# Patient Record
Sex: Female | Born: 1970
Health system: Southern US, Community
[De-identification: ages and names within clinical notes are randomized; demographics above are authoritative.]

## PROBLEM LIST (undated history)

## (undated) DIAGNOSIS — Z9289 Personal history of other medical treatment: Secondary | ICD-10-CM

## (undated) DIAGNOSIS — Z79899 Other long term (current) drug therapy: Secondary | ICD-10-CM

## (undated) DIAGNOSIS — M069 Rheumatoid arthritis, unspecified: Secondary | ICD-10-CM

## (undated) DIAGNOSIS — Z86718 Personal history of other venous thrombosis and embolism: Secondary | ICD-10-CM

## (undated) DIAGNOSIS — I2699 Other pulmonary embolism without acute cor pulmonale: Secondary | ICD-10-CM

## (undated) DIAGNOSIS — Z7901 Long term (current) use of anticoagulants: Secondary | ICD-10-CM

## (undated) DIAGNOSIS — R945 Abnormal results of liver function studies: Secondary | ICD-10-CM

## (undated) DIAGNOSIS — D689 Coagulation defect, unspecified: Secondary | ICD-10-CM

## (undated) DIAGNOSIS — R7989 Other specified abnormal findings of blood chemistry: Secondary | ICD-10-CM

## (undated) HISTORY — DX: Personal history of other venous thrombosis and embolism: Z86.718

## (undated) HISTORY — DX: Coagulation defect, unspecified: D68.9

## (undated) HISTORY — DX: Other specified abnormal findings of blood chemistry: R79.89

## (undated) HISTORY — DX: Rheumatoid arthritis, unspecified: M06.9

## (undated) HISTORY — DX: Other pulmonary embolism without acute cor pulmonale: I26.99

## (undated) HISTORY — DX: Other long term (current) drug therapy: Z79.899

## (undated) HISTORY — DX: Abnormal results of liver function studies: R94.5

## (undated) HISTORY — DX: Personal history of other medical treatment: Z92.89

## (undated) HISTORY — DX: Long term (current) use of anticoagulants: Z79.01

---

## 2004-08-02 HISTORY — PX: OTHER SURGICAL HISTORY: SHX169

## 2010-07-31 DIAGNOSIS — E282 Polycystic ovarian syndrome: Secondary | ICD-10-CM | POA: Insufficient documentation

## 2010-10-23 ENCOUNTER — Encounter: Payer: Self-pay | Admitting: Internal Medicine

## 2010-10-23 ENCOUNTER — Ambulatory Visit (INDEPENDENT_AMBULATORY_CARE_PROVIDER_SITE_OTHER): Payer: BC Managed Care – PPO | Admitting: Internal Medicine

## 2010-10-23 VITALS — BP 110/70 | HR 78 | Temp 98.4°F | Wt 106.2 lb

## 2010-10-23 DIAGNOSIS — N390 Urinary tract infection, site not specified: Secondary | ICD-10-CM

## 2010-10-23 DIAGNOSIS — K5190 Ulcerative colitis, unspecified, without complications: Secondary | ICD-10-CM

## 2010-10-23 DIAGNOSIS — R109 Unspecified abdominal pain: Secondary | ICD-10-CM

## 2010-10-23 DIAGNOSIS — Z86718 Personal history of other venous thrombosis and embolism: Secondary | ICD-10-CM

## 2010-10-23 DIAGNOSIS — M069 Rheumatoid arthritis, unspecified: Secondary | ICD-10-CM

## 2010-10-23 LAB — CBC WITH DIFFERENTIAL/PLATELET
Basophils Absolute: 0 10*3/uL (ref 0.0–0.1)
Eosinophils Relative: 3.5 % (ref 0.0–5.0)
Hemoglobin: 13.3 g/dL (ref 12.0–15.0)
Lymphocytes Relative: 25.4 % (ref 12.0–46.0)
Monocytes Relative: 8.3 % (ref 3.0–12.0)
Neutro Abs: 5.1 10*3/uL (ref 1.4–7.7)
RBC: 4.53 Mil/uL (ref 3.87–5.11)
RDW: 12.6 % (ref 11.5–14.6)
WBC: 8.2 10*3/uL (ref 4.5–10.5)

## 2010-10-23 LAB — POCT URINALYSIS DIPSTICK
Bilirubin, UA: NEGATIVE
Blood, UA: NEGATIVE
Glucose, UA: NEGATIVE
Spec Grav, UA: 1.01

## 2010-10-23 LAB — BASIC METABOLIC PANEL
Calcium: 9 mg/dL (ref 8.4–10.5)
GFR: 104.64 mL/min (ref >60.00)
Glucose, Bld: 82 mg/dL (ref 70–99)
Potassium: 4.5 mEq/L (ref 3.5–5.1)
Sodium: 140 mEq/L (ref 135–145)

## 2010-10-23 MED ORDER — CIPROFLOXACIN HCL 500 MG PO TABS: 500.0000 mg | ORAL_TABLET | Freq: Two times a day (BID) | ORAL | Status: AC

## 2010-10-23 NOTE — Progress Notes (Signed)
  Subjective:    Patient ID: Annette Hopkins, female    DOB: 01-05-1971, 40 y.o.   MRN: [id number]  HPI   new patient   developed right lower back pain today, the  Pain is steady, some  radiation to the right lower quadrant of the abdomen, described  as sharp, it  does change and get worse if she moves her torso.   similar symptoms 2 weeks ago, mildler, they self resolve. Patient wonders if she has a UTI.   Review of Systems   no fever  no nausea or vomiting She has a history of ulcerative colitis, she has chronic diarrhea, slightly worse for the last few weeks, she thinks she has a mild flareup. Denies abdominal pain No dysuria or gross hematuria No rash at the abdomen or back  Past Medical History  Diagnosis Date  . Ulcerative colitis     dx  around 2004  . History of blood clots     DVT, abdomen, on heparin   . RA (rheumatoid arthritis)     ~2005   PSH No major surgeries   SH Married 1 daughter Tobacco-no ETOH-- rarely Moved from [place] ~ July 2011 Works @ [employer]     Objective:   Physical Exam  Constitutional: She appears well-developed and well-nourished.  Eyes: No scleral icterus.  Cardiovascular: Regular rhythm and normal heart sounds.   Pulmonary/Chest: Effort normal and breath sounds normal.  Abdominal:        Nondistended, slightly increased bowel sounds, mildly tender at the lower abdomen ( suprapubic and  both sides). She is slightly tender at the  right flank. No mass or rebound.  she has  A + CVA tenderness   Musculoskeletal:        nontender to palpation on the back. She does have an antalgic position when she laid down on  the table to be examined          Assessment & Plan:

## 2010-10-23 NOTE — Assessment & Plan Note (Addendum)
Patient with multiple comorbidities presents with flank pain.  the urinalysis shows some leukocytes. She is afebrile.  most likely diagnosis is UTI however all other conditions are possible (b/c her co morbidities). Plan: Antibiotics Labs See instructions

## 2010-10-23 NOTE — Patient Instructions (Signed)
Rest  Take lots of fluids  Cipro x 10 days   Tylenol for pain Call or go to the ER if symptoms are  Severe, you get worse, you have fever, nausea, vomiting, abdominal pain.

## 2010-10-24 ENCOUNTER — Other Ambulatory Visit: Payer: Self-pay | Admitting: Internal Medicine

## 2010-10-24 NOTE — Assessment & Plan Note (Signed)
Needs to get stablish w/ a local GI

## 2010-10-24 NOTE — Assessment & Plan Note (Signed)
Declined a referal to a local rheumatologist for now

## 2010-10-26 LAB — URINE CULTURE: Colony Count: >=100000

## 2010-10-27 ENCOUNTER — Telehealth: Payer: Self-pay | Admitting: Internal Medicine

## 2010-10-27 NOTE — Telephone Encounter (Signed)
Pt is returning call.  

## 2010-11-03 ENCOUNTER — Ambulatory Visit (INDEPENDENT_AMBULATORY_CARE_PROVIDER_SITE_OTHER): Payer: BC Managed Care – PPO | Admitting: Internal Medicine

## 2010-11-03 ENCOUNTER — Encounter: Payer: Self-pay | Admitting: Internal Medicine

## 2010-11-03 DIAGNOSIS — Z86718 Personal history of other venous thrombosis and embolism: Secondary | ICD-10-CM

## 2010-11-03 DIAGNOSIS — R109 Unspecified abdominal pain: Secondary | ICD-10-CM

## 2010-11-03 DIAGNOSIS — K5190 Ulcerative colitis, unspecified, without complications: Secondary | ICD-10-CM

## 2010-11-03 DIAGNOSIS — Z0000 Encounter for general adult medical examination without abnormal findings: Secondary | ICD-10-CM | POA: Insufficient documentation

## 2010-11-03 NOTE — Patient Instructions (Signed)
Please get records from your GI and hematologist doctors in [place]

## 2010-11-03 NOTE — Assessment & Plan Note (Signed)
see history of present illness, having more diarrhea than usual. She self discontinue Imuran ~ 1 year ago. Definitely needs a local GI, will refer. Will also going to get records from previous GI

## 2010-11-03 NOTE — Assessment & Plan Note (Signed)
Symptoms resolved, suspect she had a UTI. Will call if symptoms resurface

## 2010-11-03 NOTE — Progress Notes (Signed)
  Subjective:    Patient ID: Annette Hopkins, female    DOB: 08/07/1970, 40 y.o.   MRN: [id number]  HPI Followup from last office visit, she had right-sided flank pain, urine culture was inconclusive, she finished ciprofloxacin as prescribed. Symptoms  resolved.   Past Medical History  Diagnosis Date  . Ulcerative colitis     dx  around 2004  . History of blood clots     DVT, abdomen, on heparin   . RA (rheumatoid arthritis)     ~2005     Review of Systems No fever, nausea or vomiting In the last few weeks, she thinks she is having a colitis flareup. She is having more diarrhea than usual and feeling weak. No blood in the stools Appetite is slightly low. Has noted some weight loss. Admits to some emotional distress because she moved to [place] a few months ago but denies any problems at home.    Objective:   Physical Exam  Constitutional: She appears well-developed.  Eyes:       Not pale or jaundice  Cardiovascular: Normal rate, regular rhythm and normal heart sounds.   Pulmonary/Chest: Breath sounds normal. No respiratory distress.  Abdominal: Soft. Bowel sounds are normal. She exhibits no distension. There is no tenderness. There is no rebound and no guarding.          Assessment & Plan:

## 2010-11-03 NOTE — Assessment & Plan Note (Signed)
Needs a local gynecologist,   referral

## 2010-11-03 NOTE — Assessment & Plan Note (Signed)
Will be due to see a hematologist in few months, will refer her when she comes back. We'll try to get old records

## 2010-11-04 ENCOUNTER — Telehealth: Payer: Self-pay | Admitting: Internal Medicine

## 2010-11-05 NOTE — Telephone Encounter (Signed)
Pt set up to see [place] PA 11/09/10 @3pm . Renee to notify pt of appt date and time. Renee working on getting records.

## 2010-11-09 ENCOUNTER — Ambulatory Visit: Payer: BC Managed Care – PPO | Admitting: Physician Assistant

## 2010-11-10 ENCOUNTER — Ambulatory Visit (INDEPENDENT_AMBULATORY_CARE_PROVIDER_SITE_OTHER): Payer: BLUE CROSS/BLUE SHIELD | Admitting: Nurse Practitioner

## 2010-11-10 ENCOUNTER — Encounter: Payer: Self-pay | Admitting: Nurse Practitioner

## 2010-11-10 DIAGNOSIS — K5190 Ulcerative colitis, unspecified, without complications: Secondary | ICD-10-CM

## 2010-11-10 DIAGNOSIS — M069 Rheumatoid arthritis, unspecified: Secondary | ICD-10-CM

## 2010-11-10 DIAGNOSIS — Z86718 Personal history of other venous thrombosis and embolism: Secondary | ICD-10-CM

## 2010-11-10 DIAGNOSIS — R7401 Elevation of levels of liver transaminase levels: Secondary | ICD-10-CM

## 2010-11-10 MED ORDER — MESALAMINE 1.2 G PO TBEC: 1200.0000 mg | DELAYED_RELEASE_TABLET | Freq: Every day | ORAL | Status: DC

## 2010-11-10 NOTE — Patient Instructions (Signed)
Take the Lialda , 1 tablet daily. Samples given. We made you a follow up appointment with Dr. Loralee Pacas. Sharlett Iles on 12-15-2010. Appointment card provided. You will need to come to our lab on our basement level on May 10 for lab work. Call us if your symptoms return before your appointment.

## 2010-11-10 NOTE — Progress Notes (Signed)
11/10/2010 Annette Hopkins [id number] [birth year]   History of Present Illness:  Annette Hopkins is a 40 year old white female who relocated here from  in July. Patient has a history of rheumatoid arthritis and a 7 year history of ulcerative colitis and had been maintained on Mesalamine and Azathiopurine. Her last flare was 4 years ago. Her last colonoscopy was 4-5 years ago. Upon moving here patient stopped her Azathiopurine until she could could get established with a local gastroenterologist. Because she felt so well, patient did not restart the Azathiopurine and she isn't taking the Lialda on a regular basis. Two months ago she developed recurrent diarrhea (non-bloody) associated with loss of appetite and diffuse lower abdominal cramps. No recent antibiotics.  No fevers. Over the last 4 days she has actually felt better. Of note, patient had Cipro a few days ago for UTI, her diarrhea has since improved. She has lost 15 pounds but appetite is now improving. In late March her CBC and BMET were normal. Transaminases were mildly elevated.  Complains of back, hip and knee pain. She hasn't established care with a local rheumatologist yet.  Years ago she was on Humira for RA. Sounds like that RA symptoms improved with the Azathiopurine patient was taking for UC.    Patient is on chronic Lovenox for what sounds like a history of DVTs. On Lovenox instead of Coumadin since she is interested in conceiving,    Past Medical History  Diagnosis Date  . Ulcerative colitis     dx  around 2004  . History of blood clots     DVT, abdomen, on heparin   . RA (rheumatoid arthritis)     ~2005   Past Surgical History  Procedure Date  . Vascular stents bilateral groins     reports that she has never smoked. She does not have any smokeless tobacco history on file. She reports that she drinks alcohol. She reports that she does not use illicit drugs. family history includes Colon cancer in her paternal grandmother and  Colon polyps in an unspecified family member. Allergies  Allergen Reactions  . Sulfa Antibiotics       Outpatient Encounter Prescriptions as of 11/10/2010  Medication Sig Dispense Refill  . enoxaparin (LOVENOX) 150 MG/ML injection Inject into the skin daily.        . mesalamine (LIALDA) 1.2 G EC tablet Take 1,200 mg by mouth 4 (four) times daily. As needed/does not take on daily basis         Allergies  Allergen Reactions  . Sulfa Antibiotics     ROS: All other systems reviewed and negative except where noted in History of Present Illness.  Physical Exam: General: Thin white female in no acute distress Head: Normocephalic and atraumatic Eyes:  sclerae anicteric,conjunctive pink. Ears: Normal auditory acuity Mouth: No deformity or lesions Neck: Supple, no masses.  Lungs: Clear throughout to auscultation Heart: Regular rate and rhythm; no murmurs heard Abdomen: Soft, non tender and non distended. No masses or hepatomegaly noted. Normal Bowel sounds Rectal: Not done Musculoskeletal: Symmetrical with no gross deformities  Skin: No lesions on visible extremities Extremities: No edema or deformities noted Neurological: Alert oriented x 4, grossly nonfocal Cervical Nodes:  No significant cervical adenopathy Psychological:  Alert and cooperative. Normal mood and affect   Assessment and Plan: Ulcerative colitis Patient hasn't been taking her Azathiopurine for several months. She hasn't been taking Lialda on a regular basis. Now with two month history of flare symptoms though has  significantly improved over the last several days for unclear reasons. Patient had Cipro several days ago for UTI. Maybe the antibiotic helped her diarrhea as well.Her gastroenterology records have been requested from [facility]. She will restart Lialda and take it daily as prescribed. Will hold off on restarting Azathiopurine until her records are reviewed and she comes back for follow up in 3-4 weeks. In the  meantime, If she has recurrent diarrhea then stool for C-Diff needs to be checked given recent antibiotic use.  RA (rheumatoid arthritis) She plans to establish herself with a local Rheumatologist  Transaminitis Recent AST 48 and ALT 84. I do not have a baseline, awaiting records from [place]. Will have patient come couple of days before next appt. for repeat LFTs and further workup based on results.

## 2010-11-11 DIAGNOSIS — R7401 Elevation of levels of liver transaminase levels: Secondary | ICD-10-CM | POA: Insufficient documentation

## 2010-11-11 NOTE — Assessment & Plan Note (Signed)
Patient hasn't been taking her Azathiopurine for several months. She hasn't been taking Lialda on a regular basis. Now with two month history of flare symptoms though has significantly improved over the last several days for unclear reasons. Patient had Cipro several days ago for UTI. Maybe the antibiotic helped her diarrhea as well.Her gastroenterology records have been requested from [place]. She will restart Lialda and take it daily as prescribed. Will hold off on restarting Azathiopurine until her records are reviewed and she comes back for follow up in 3-4 weeks. In the meantime, If she has recurrent diarrhea then stool for C-Diff needs to be checked given recent antibiotic use.

## 2010-11-11 NOTE — Assessment & Plan Note (Signed)
She plans to establish herself with a local Rheumatologist

## 2010-11-11 NOTE — Assessment & Plan Note (Signed)
Recent AST 48 and ALT 84. I do not have a baseline, awaiting records from [place]. Will have patient come couple of days before next appt. for repeat LFTs and further workup based on results.

## 2010-11-17 ENCOUNTER — Telehealth: Payer: Self-pay | Admitting: *Deleted

## 2010-11-17 NOTE — Telephone Encounter (Signed)
Called pt to get progress report on past visit with extender.

## 2010-12-03 ENCOUNTER — Telehealth: Payer: Self-pay | Admitting: *Deleted

## 2010-12-03 NOTE — Telephone Encounter (Signed)
Message copied by Marisue Humble on Thu Dec 03, 2010  9:56 AM ------      Message from: [place], [place]      Created: Tue Nov 10, 2010  3:47 PM      Regarding: Labs       Pt has appt to see Dr. Sharlett Iles on 12-15-2010.      Pt coming to the lab on 12-10-2010 to have the LFT's drawn.      Look to see if pt came to the lab.

## 2010-12-04 NOTE — Telephone Encounter (Signed)
Pt has appt to see Dr Sharlett Iles 5-15- 2012. Pt coning to the lab on 5-10.

## 2010-12-15 ENCOUNTER — Other Ambulatory Visit (INDEPENDENT_AMBULATORY_CARE_PROVIDER_SITE_OTHER): Payer: BC Managed Care – PPO

## 2010-12-15 ENCOUNTER — Ambulatory Visit: Payer: BC Managed Care – PPO | Admitting: Internal Medicine

## 2010-12-15 ENCOUNTER — Ambulatory Visit (INDEPENDENT_AMBULATORY_CARE_PROVIDER_SITE_OTHER): Payer: BC Managed Care – PPO | Admitting: Gastroenterology

## 2010-12-15 ENCOUNTER — Encounter: Payer: Self-pay | Admitting: Gastroenterology

## 2010-12-15 DIAGNOSIS — K5190 Ulcerative colitis, unspecified, without complications: Secondary | ICD-10-CM

## 2010-12-15 LAB — HEPATIC FUNCTION PANEL
ALT: 77 U/L — ABNORMAL HIGH (ref 0–35)
AST: 45 U/L — ABNORMAL HIGH (ref 0–37)
Bilirubin, Direct: 0.1 mg/dL (ref 0.0–0.3)
Total Bilirubin: 0.6 mg/dL (ref 0.3–1.2)
Total Protein: 7.2 g/dL (ref 6.0–8.3)

## 2010-12-15 LAB — FOLATE: Folate: 18.7 ng/mL (ref >5.9)

## 2010-12-15 LAB — FERRITIN: Ferritin: 34.3 ng/mL (ref 10.0–291.0)

## 2010-12-15 MED ORDER — MERCAPTOPURINE 50 MG PO TABS: ORAL_TABLET | ORAL | Status: DC

## 2010-12-15 NOTE — Progress Notes (Signed)
History of Present Illness:  This is a 40 year old Caucasian female with 7 years of recurrent relapsing ulcerative colitis. She has multiple records from [facility] were reviewed over long period of time. Basically, she has had relapse and ulcerative colitis despite treatment with amino salicylates, Imuran, and Humira. She moved to [place] in July and has been off all medications since that period of time. She was recently seen by nurse practitioner and placed only L. a 2.4 g a day because of several months of abdominal cramping, diarrhea, and some slight heme in her stools without other symptomatology. She has inflammatory bowel disease related arthritis in her large joints, also a history of recurrent thrombophlebitis with daily Lovenox injections, and she has had some mildly abnormal liver function test of unexplained etiology. There is no history of hepatitis or pancreatitis.  Currently she denies diarrhea, abdominal pain, rectal bleeding. There is no history of upper GI or hepatobiliary complaints. Her appetite is good but she has lost approximately 10 pounds in weight over the last year. Does have lactose intolerance. Family history is remarkable for diverticulosis but not IBD.  I have reviewed this patient's present history, medical and surgical past history, allergies and medications.     ROS: The remainder of the 10 point ROS is negative  Past Medical History  Diagnosis Date  . Ulcerative colitis     dx  around 2004  . History of blood clots     DVT, abdomen, on heparin   . RA (rheumatoid arthritis)     ~2005   Past Surgical History  Procedure Date  . Vascular stents bilateral groins     reports that she has never smoked. She has never used smokeless tobacco. She reports that she drinks alcohol. She reports that she does not use illicit drugs. family history includes Breast cancer in her mother; Colon cancer in her paternal grandmother; and Colon polyps in her paternal  grandmother. Allergies  Allergen Reactions  . Sulfa Antibiotics         Physical Exam: General well developed well nourished patient in no acute distress, appearing their stated age Eyes PERRLA, no icterus, fundoscopic exam per opthamologist Skin no lesions noted Neck supple, no adenopathy, no thyroid enlargement, no tenderness Chest clear to percussion and auscultation Heart no significant murmurs, gallops or rubs noted Abdomen no hepatosplenomegaly masses or tenderness, BS normal. . Extremities no acute joint lesions, edema, phlebitis or evidence of cellulitis. Neurologic patient oriented x 3, cranial nerves intact, no focal neurologic deficits noted. Psychological mental status normal and normal affect.  Assessment and plan: I have reviewed her records in detail, and she has had severe relapsing ulcerative colitis. I have restarted 6-MP 100 mg a day, will continue Lialda 2.4 g a day, and she is to see me back in one month's time for followup. I reviewed her labs that she does have some mild elevation in her serum transaminases, and workup for metabolic liver disease or prior hepatitis has been ordered for laboratory review. I have given her information concerning the local ileitis-colitis Foundation. Side effects of immunosuppressants were reviewed and she understands the need for frequent CBCs although she has not had problems with 6-MP metabolite toxicity in the past. The patient does not use birth control, and may become pregnant in the near future. This is another reason to begin immunosuppressants since she has had severe relapses in the past with pregnancy.  Please copy her primary care physician, referring physician, and pertinent subspecialists. Encounter  Diagnosis  Name Primary?  . Ulcerative colitis Yes

## 2010-12-15 NOTE — Patient Instructions (Signed)
Please go to the basement today for your labs.  Your prescription(s) have been sent to you pharmacy.  Make an office visit to come back and see Dr Sharlett Iles in 6 weeks.

## 2010-12-16 ENCOUNTER — Telehealth: Payer: Self-pay | Admitting: *Deleted

## 2010-12-16 LAB — HEPATITIS C ANTIBODY: HCV Ab: NEGATIVE

## 2010-12-16 LAB — HEPATITIS B SURFACE ANTIGEN: Hepatitis B Surface Ag: NEGATIVE

## 2010-12-16 LAB — CERULOPLASMIN: Ceruloplasmin: 48 mg/dL (ref 21–63)

## 2010-12-16 NOTE — Telephone Encounter (Signed)
Message copied by Shella Maxim on Wed Dec 16, 2010  9:23 AM ------      Message from: Sharlett Iles, DAVID      Created: Wed Dec 16, 2010  8:48 AM       Call her,,,liver w/u is negative for prior hepatitis or metabolic liver disease.Marland Kitchen

## 2010-12-16 NOTE — Telephone Encounter (Signed)
lmom for pt to call back

## 2010-12-16 NOTE — Telephone Encounter (Signed)
Informed pt per Dr Sharlett Iles, her liver work up for prior hepatitis or metabolic liver disease is negative. Pt verbalized understanding.

## 2010-12-21 ENCOUNTER — Encounter: Payer: Self-pay | Admitting: Gastroenterology

## 2010-12-22 ENCOUNTER — Telehealth: Payer: Self-pay | Admitting: Gastroenterology

## 2010-12-22 NOTE — Telephone Encounter (Signed)
Pt reports she went to the [facility] ER last pm because of chest pain. Pt stated she had a really hard time breathing d/t the chest pain. The ER MD thought her problem was d/t the 6MP which was ordered on 12/15/10. Pt reports she hasn't taken any today. Please advise. Thanks.

## 2010-12-23 ENCOUNTER — Telehealth: Payer: Self-pay | Admitting: Internal Medicine

## 2010-12-23 NOTE — Telephone Encounter (Signed)
Message left for patient to return my call.  

## 2010-12-23 NOTE — Telephone Encounter (Signed)
This is not a side effect from 6-MP she is taken in the past without problem. Would advise her to take this medication because of her colitis. I have never seen this is a side effect of 6-MP medication in over 35 years.

## 2010-12-23 NOTE — Telephone Encounter (Signed)
lmom to call back 

## 2010-12-23 NOTE — Telephone Encounter (Signed)
Pt aware of information.

## 2010-12-23 NOTE — Telephone Encounter (Signed)
Notified pt of  Dr Buel Ream instructions and recommendations that she continue the 6MP. Pt stated understanding and will call for further problems.

## 2010-12-23 NOTE — Telephone Encounter (Signed)
33s of old records were sent to me, I reviewed most of them. She was diagnosed with left lower extremity DVT in 2005 History of a primary hypercoagulate state.  bone density test in 2009 showed T score of -2.1. Labs from March 2011 showed a normal CBC, BMP, LFTs. Her cholesterol was okay. UA showed pyuria  Plan: Will scan the most relevant internal medicine documents. Will send all the records back to the patient for safe keeping. Please let her know I called GI,Dr Sharlett Iles, they already have a copy of all her records.

## 2011-01-21 ENCOUNTER — Telehealth: Payer: Self-pay | Admitting: Internal Medicine

## 2011-01-21 DIAGNOSIS — Z86718 Personal history of other venous thrombosis and embolism: Secondary | ICD-10-CM

## 2011-01-21 NOTE — Telephone Encounter (Signed)
Okay to refer her to one of our local hematologist. I think they have access to Epic already

## 2011-01-21 NOTE — Telephone Encounter (Signed)
Pt called would like to have referral to hematologist due to history of clots since she had one in [place] informed pt she will need to be sure and sign release of records since that information will be needed prior to getting appt w/ them.  Ok to refer?

## 2011-01-25 ENCOUNTER — Other Ambulatory Visit: Payer: Self-pay | Admitting: Gastroenterology

## 2011-01-25 MED ORDER — MERCAPTOPURINE 50 MG PO TABS: ORAL_TABLET | ORAL | Status: DC

## 2011-01-25 NOTE — Telephone Encounter (Signed)
rx sent to [facility].

## 2011-01-26 ENCOUNTER — Ambulatory Visit: Payer: BC Managed Care – PPO | Admitting: Hematology & Oncology

## 2011-02-16 ENCOUNTER — Other Ambulatory Visit (INDEPENDENT_AMBULATORY_CARE_PROVIDER_SITE_OTHER): Payer: BC Managed Care – PPO

## 2011-02-16 ENCOUNTER — Ambulatory Visit (INDEPENDENT_AMBULATORY_CARE_PROVIDER_SITE_OTHER): Payer: BC Managed Care – PPO | Admitting: Gastroenterology

## 2011-02-16 ENCOUNTER — Encounter: Payer: Self-pay | Admitting: Gastroenterology

## 2011-02-16 VITALS — BP 80/50 | HR 80 | Ht 64.0 in | Wt 105.8 lb

## 2011-02-16 DIAGNOSIS — R7989 Other specified abnormal findings of blood chemistry: Secondary | ICD-10-CM

## 2011-02-16 DIAGNOSIS — R112 Nausea with vomiting, unspecified: Secondary | ICD-10-CM | POA: Insufficient documentation

## 2011-02-16 DIAGNOSIS — D689 Coagulation defect, unspecified: Secondary | ICD-10-CM

## 2011-02-16 DIAGNOSIS — R945 Abnormal results of liver function studies: Secondary | ICD-10-CM | POA: Insufficient documentation

## 2011-02-16 DIAGNOSIS — D649 Anemia, unspecified: Secondary | ICD-10-CM

## 2011-02-16 DIAGNOSIS — K5190 Ulcerative colitis, unspecified, without complications: Secondary | ICD-10-CM

## 2011-02-16 LAB — CBC WITH DIFFERENTIAL/PLATELET
Eosinophils Relative: 1.9 % (ref 0.0–5.0)
HCT: 20.6 % — CL (ref 36.0–46.0)
Lymphocytes Relative: 41.6 % (ref 12.0–46.0)
Monocytes Relative: 5 % (ref 3.0–12.0)
Neutrophils Relative %: 50.8 % (ref 43.0–77.0)
Platelets: 155 10*3/uL (ref 150.0–400.0)
WBC: 1.8 10*3/uL — CL (ref 4.5–10.5)

## 2011-02-16 LAB — BASIC METABOLIC PANEL
BUN: 9 mg/dL (ref 6–23)
Chloride: 100 mEq/L (ref 96–112)
Creatinine, Ser: 0.6 mg/dL (ref 0.4–1.2)

## 2011-02-16 LAB — HEPATIC FUNCTION PANEL
ALT: 118 U/L — ABNORMAL HIGH (ref 0–35)
AST: 54 U/L — ABNORMAL HIGH (ref 0–37)
Albumin: 4.1 g/dL (ref 3.5–5.2)
Total Protein: 6.9 g/dL (ref 6.0–8.3)

## 2011-02-16 LAB — IBC PANEL
Iron: 202 ug/dL — ABNORMAL HIGH (ref 42–145)
Saturation Ratios: 84.9 % — ABNORMAL HIGH (ref 20.0–50.0)
Transferrin: 169.9 mg/dL — ABNORMAL LOW (ref 212.0–360.0)

## 2011-02-16 LAB — FOLATE: Folate: 23 ng/mL (ref >5.9)

## 2011-02-16 LAB — VITAMIN B12: Vitamin B-12: 398 pg/mL (ref 211–911)

## 2011-02-16 LAB — TSH: TSH: 1.18 u[IU]/mL (ref 0.35–5.50)

## 2011-02-16 NOTE — Progress Notes (Signed)
This is a extremely pleasant 40 year old Caucasian female with inflammatory bowel disease in remission on 6-MP 50 mg a day. Her main complaint currently is constant nausea with intermittent vomiting of partially digested food products, belching, burping, and a full sensation in her substernal area. She has no known history of hepatic or gallbladder issues. He denies abuse of alcohol, cigarettes, or NSAIDs. She also takes Lialda 0.4 g a day and Lovenox injections because of a history of recurrent DVTs. Followed by Dr. Burney Gauze  in hematology. Patient is currently having regular bowel movements without melena or hematochezia. She denies systemic complaints except for chronic fatigue.  Current Medications, Allergies, Past Medical History, Past Surgical History, Family History and Social History were reviewed in [employer] record.  Pertinent Review of Systems Negative.. No menstrual period since late May of this year, but she denies pregnancy. No history of cardiovascular, pulmonary, or other genitourinary problems. Her appetite is good her weight is stable. She denies a specific food intolerances.   Physical Exam: Awake and alert no acute distress appearing her stated age. I cannot appreciate stigmata of chronic liver disease. Chest is clear cardiac exam is unremarkable. She has no hepatosplenomegaly, abdominal masses or tenderness. Bowel sounds are normal and there is no succussion splash. Peripheral extremities unremarkable mental status is normal.    Assessment and Plan: Rule out upper GI Crohn's disease, H. pylori infection with peptic ulcer disease, gallbladder disease, or reaction to 6-MP. Repeat labs have been ordered along with endoscopy and upper abdominal ultrasound exam. Currently she is to continue medications as listed. We have postponed her hematology appointment till her GI problems have been addressed. Please copy her primary care physician, referring physician,  and pertinent subspecialists.  Encounter Diagnosis  Name Primary?  . Nausea & vomiting Yes

## 2011-02-16 NOTE — Patient Instructions (Signed)
Please go to the basement today for your labs.  Your procedure has been scheduled for 02/17/2011, please follow the seperate instructions.  We will contact you with another appt Dr. Marin Olp.  Your abdominal ultrasound is scheduled for 02/18/2011 at [facility], please arrive at 8:45am and have nothing to eat or drink after midnight.

## 2011-02-17 ENCOUNTER — Ambulatory Visit (AMBULATORY_SURGERY_CENTER): Payer: BC Managed Care – PPO | Admitting: Gastroenterology

## 2011-02-17 ENCOUNTER — Telehealth: Payer: Self-pay | Admitting: *Deleted

## 2011-02-17 ENCOUNTER — Ambulatory Visit: Payer: BC Managed Care – PPO | Admitting: Hematology & Oncology

## 2011-02-17 ENCOUNTER — Encounter: Payer: Self-pay | Admitting: Gastroenterology

## 2011-02-17 DIAGNOSIS — K209 Esophagitis, unspecified without bleeding: Secondary | ICD-10-CM

## 2011-02-17 DIAGNOSIS — R112 Nausea with vomiting, unspecified: Secondary | ICD-10-CM

## 2011-02-17 DIAGNOSIS — R109 Unspecified abdominal pain: Secondary | ICD-10-CM

## 2011-02-17 DIAGNOSIS — K2970 Gastritis, unspecified, without bleeding: Secondary | ICD-10-CM

## 2011-02-17 DIAGNOSIS — K219 Gastro-esophageal reflux disease without esophagitis: Secondary | ICD-10-CM

## 2011-02-17 DIAGNOSIS — K208 Other esophagitis without bleeding: Secondary | ICD-10-CM

## 2011-02-17 LAB — FERRITIN: Ferritin: 299.8 ng/mL — ABNORMAL HIGH (ref 10.0–291.0)

## 2011-02-17 LAB — CELIAC PANEL 10: Tissue Transglut Ab: 14.9 U/mL (ref <20)

## 2011-02-17 MED ORDER — SODIUM CHLORIDE 0.9 % IV SOLN: 500.0000 mL | INTRAVENOUS | Status: DC

## 2011-02-17 NOTE — Patient Instructions (Signed)
Please refer to blue and green discharge instruction sheets. 

## 2011-02-17 NOTE — Telephone Encounter (Signed)
Pt needs CBC in once week I will contact her about scheduling this for 02/24/2011

## 2011-02-18 ENCOUNTER — Telehealth: Payer: Self-pay | Admitting: *Deleted

## 2011-02-18 ENCOUNTER — Ambulatory Visit (HOSPITAL_COMMUNITY)
Admission: RE | Admit: 2011-02-18 | Discharge: 2011-02-18 | Disposition: A | Discharge status: Home / Self Care | Payer: BC Managed Care – PPO | Source: Ambulatory Visit | Attending: Gastroenterology | Admitting: Gastroenterology

## 2011-02-18 DIAGNOSIS — R112 Nausea with vomiting, unspecified: Secondary | ICD-10-CM

## 2011-02-18 LAB — HELICOBACTER PYLORI SCREEN-BIOPSY: UREASE: NEGATIVE

## 2011-02-18 NOTE — Telephone Encounter (Signed)
Message left to call us if necessary.

## 2011-02-22 ENCOUNTER — Telehealth: Payer: Self-pay | Admitting: *Deleted

## 2011-02-22 DIAGNOSIS — D649 Anemia, unspecified: Secondary | ICD-10-CM

## 2011-02-22 NOTE — Telephone Encounter (Signed)
Message copied by Sheral Flow on Mon Feb 22, 2011  1:34 PM ------      Message from: Sharlett Iles, DAVID R      Created: Mon Feb 22, 2011  8:49 AM       Call her per normal results.Marland KitchenMarland Kitchen

## 2011-02-22 NOTE — Telephone Encounter (Signed)
Pt aware.

## 2011-02-22 NOTE — Telephone Encounter (Signed)
Per last labs pt needs a CBC tomorrow. We will contact her about the labs when they come back .

## 2011-02-22 NOTE — Telephone Encounter (Signed)
Ultrasound is normal

## 2011-02-22 NOTE — Telephone Encounter (Signed)
Addended by: Bernita Buffy D on: 02/22/2011 01:41 PM   Modules accepted: Orders

## 2011-02-23 ENCOUNTER — Other Ambulatory Visit (INDEPENDENT_AMBULATORY_CARE_PROVIDER_SITE_OTHER): Payer: BC Managed Care – PPO

## 2011-02-23 ENCOUNTER — Telehealth: Payer: Self-pay | Admitting: *Deleted

## 2011-02-23 DIAGNOSIS — D649 Anemia, unspecified: Secondary | ICD-10-CM

## 2011-02-23 LAB — CBC WITH DIFFERENTIAL/PLATELET
Basophils Absolute: 0 10*3/uL (ref 0.0–0.1)
Eosinophils Absolute: 0.1 10*3/uL (ref 0.0–0.7)
Lymphocytes Relative: 43.9 % (ref 12.0–46.0)
MCHC: 35.5 g/dL (ref 30.0–36.0)
Monocytes Relative: 15 % — ABNORMAL HIGH (ref 3.0–12.0)
Neutrophils Relative %: 33.4 % — ABNORMAL LOW (ref 43.0–77.0)
Platelets: 95 10*3/uL — ABNORMAL LOW (ref 150.0–400.0)
RDW: 29.1 % — ABNORMAL HIGH (ref 11.5–14.6)

## 2011-02-23 NOTE — Telephone Encounter (Addendum)
Pt with continuously dropping H&H and Dr Sharlett Iles wanted pt seen earlier than 03/19/11. Called Dr Antonieta Pert RN,  [facility] and she stated he is off today. She will call us back tomorrow after asking Dr Marin Olp if pt can be worked in. Dr Sharlett Iles requested we call the main [facility] for possibility of an earlier appt. Called Lannette Donath, RN and acting NP coordinator who worked in pt to see Dr Sherryl Manges on 02/25/11. Called pt and she was in agreement to see Dr Lamonte Sakai. Pt will have labs and T&C at 0930am, see Dr Lamonte Sakai at 10:00am, and have possible transfusion at 11:00am. Pt given instructions and directions to [facility]. Called Ladean Raya, RN to fax records to [facility] at [phone] and I will fax our records.

## 2011-02-25 ENCOUNTER — Other Ambulatory Visit: Payer: Self-pay | Admitting: Oncology

## 2011-02-25 ENCOUNTER — Encounter (HOSPITAL_BASED_OUTPATIENT_CLINIC_OR_DEPARTMENT_OTHER): Payer: BC Managed Care – PPO | Admitting: Oncology

## 2011-02-25 ENCOUNTER — Encounter (HOSPITAL_COMMUNITY)
Admission: RE | Admit: 2011-02-25 | Discharge: 2011-02-25 | Disposition: A | Discharge status: Home / Self Care | Payer: BC Managed Care – PPO | Source: Ambulatory Visit | Attending: Oncology | Admitting: Oncology

## 2011-02-25 ENCOUNTER — Ambulatory Visit (HOSPITAL_COMMUNITY)
Admission: RE | Admit: 2011-02-25 | Discharge: 2011-02-25 | Disposition: A | Discharge status: Home / Self Care | Payer: BC Managed Care – PPO | Source: Ambulatory Visit | Attending: Oncology | Admitting: Oncology

## 2011-02-25 DIAGNOSIS — D709 Neutropenia, unspecified: Secondary | ICD-10-CM

## 2011-02-25 DIAGNOSIS — D649 Anemia, unspecified: Secondary | ICD-10-CM

## 2011-02-25 DIAGNOSIS — R0602 Shortness of breath: Secondary | ICD-10-CM | POA: Insufficient documentation

## 2011-02-25 DIAGNOSIS — Z09 Encounter for follow-up examination after completed treatment for conditions other than malignant neoplasm: Secondary | ICD-10-CM

## 2011-02-25 DIAGNOSIS — D61818 Other pancytopenia: Secondary | ICD-10-CM

## 2011-02-25 DIAGNOSIS — K5190 Ulcerative colitis, unspecified, without complications: Secondary | ICD-10-CM

## 2011-02-25 DIAGNOSIS — R059 Cough, unspecified: Secondary | ICD-10-CM | POA: Insufficient documentation

## 2011-02-25 DIAGNOSIS — R05 Cough: Secondary | ICD-10-CM | POA: Insufficient documentation

## 2011-02-25 DIAGNOSIS — R5081 Fever presenting with conditions classified elsewhere: Secondary | ICD-10-CM

## 2011-02-25 LAB — TYPE & CROSSMATCH - CHCC

## 2011-02-25 LAB — CBC & DIFF AND RETIC
HCT: 19.6 % — ABNORMAL LOW (ref 34.8–46.6)
HGB: 6.7 g/dL — CL (ref 11.6–15.9)
Immature Retic Fract: 15.9 % — ABNORMAL HIGH (ref 0.00–10.70)
MCHC: 34.2 g/dL (ref 31.5–36.0)
Retic %: 4.85 % — ABNORMAL HIGH (ref 0.50–1.50)
Retic Ct Abs: 102.82 10*3/uL — ABNORMAL HIGH (ref 18.30–72.70)
WBC: 1.8 10*3/uL — ABNORMAL LOW (ref 3.9–10.3)

## 2011-02-25 LAB — URINALYSIS, MICROSCOPIC - CHCC
Bilirubin (Urine): NEGATIVE
Glucose: NEGATIVE g/dL
Ketones: NEGATIVE mg/dL

## 2011-02-25 LAB — MANUAL DIFFERENTIAL
ALC: 1 10*3/uL (ref 0.9–3.3)
ANC (CHCC manual diff): 0.6 10*3/uL — ABNORMAL LOW (ref 1.5–6.5)
MONO: 6 % (ref 0–14)
Metamyelocytes: 0 % (ref 0–0)
Myelocytes: 0 % (ref 0–0)
PLT EST: DECREASED
PROMYELO: 0 % (ref 0–0)
Variant Lymph: 0 % (ref 0–0)

## 2011-02-25 LAB — CHCC SMEAR

## 2011-02-26 LAB — CROSSMATCH
ABO/RH(D): O POS
Unit division: 0

## 2011-03-01 ENCOUNTER — Encounter (HOSPITAL_BASED_OUTPATIENT_CLINIC_OR_DEPARTMENT_OTHER): Payer: BC Managed Care – PPO | Admitting: Oncology

## 2011-03-01 ENCOUNTER — Other Ambulatory Visit: Payer: Self-pay | Admitting: Oncology

## 2011-03-01 DIAGNOSIS — D649 Anemia, unspecified: Secondary | ICD-10-CM

## 2011-03-01 LAB — CBC WITH DIFFERENTIAL/PLATELET
Basophils Absolute: 0 10*3/uL (ref 0.0–0.1)
EOS%: 5 % (ref 0.0–7.0)
HCT: 27.5 % — ABNORMAL LOW (ref 34.8–46.6)
HGB: 9.8 g/dL — ABNORMAL LOW (ref 11.6–15.9)
MCH: 32.9 pg (ref 25.1–34.0)
MCV: 92.5 fL (ref 79.5–101.0)
MONO%: 19.3 % — ABNORMAL HIGH (ref 0.0–14.0)
NEUT%: 35.9 % — ABNORMAL LOW (ref 38.4–76.8)

## 2011-03-02 ENCOUNTER — Telehealth: Payer: Self-pay | Admitting: Gastroenterology

## 2011-03-02 MED ORDER — MESALAMINE 1.2 G PO TBEC: DELAYED_RELEASE_TABLET | ORAL | Status: DC

## 2011-03-02 NOTE — Telephone Encounter (Signed)
rx sent pt aware

## 2011-03-02 NOTE — Telephone Encounter (Signed)
Yes  lialda 1.2 gm 2 daily

## 2011-03-02 NOTE — Telephone Encounter (Signed)
Left message on pt voicemail to see how many lialda a day she takes.

## 2011-03-02 NOTE — Telephone Encounter (Signed)
Pt is taking lialda one tablet a day but since stopping her 46m she is having some abdominal issues. Can she increase to two tablets a day of the Lialda?

## 2011-03-05 ENCOUNTER — Telehealth: Payer: Self-pay | Admitting: Gastroenterology

## 2011-03-05 MED ORDER — MESALAMINE 1.2 G PO TBEC: DELAYED_RELEASE_TABLET | ORAL | Status: DC

## 2011-03-05 NOTE — Telephone Encounter (Signed)
Pt aware rx now sent

## 2011-03-08 ENCOUNTER — Encounter (HOSPITAL_BASED_OUTPATIENT_CLINIC_OR_DEPARTMENT_OTHER): Payer: BC Managed Care – PPO | Admitting: Oncology

## 2011-03-08 ENCOUNTER — Other Ambulatory Visit: Payer: Self-pay | Admitting: Oncology

## 2011-03-08 DIAGNOSIS — D649 Anemia, unspecified: Secondary | ICD-10-CM

## 2011-03-08 LAB — CBC WITH DIFFERENTIAL/PLATELET
Basophils Absolute: 0 10*3/uL (ref 0.0–0.1)
Eosinophils Absolute: 0.1 10*3/uL (ref 0.0–0.5)
HCT: 30.4 % — ABNORMAL LOW (ref 34.8–46.6)
HGB: 10.5 g/dL — ABNORMAL LOW (ref 11.6–15.9)
LYMPH%: 40.2 % (ref 14.0–49.7)
MCV: 95.1 fL (ref 79.5–101.0)
MONO%: 12.7 % (ref 0.0–14.0)
NEUT#: 1.9 10*3/uL (ref 1.5–6.5)
Platelets: 176 10*3/uL (ref 145–400)

## 2011-03-16 ENCOUNTER — Encounter: Payer: Self-pay | Admitting: Gastroenterology

## 2011-03-19 ENCOUNTER — Ambulatory Visit: Payer: BC Managed Care – PPO | Admitting: Hematology & Oncology

## 2011-03-29 ENCOUNTER — Other Ambulatory Visit: Payer: Self-pay | Admitting: Oncology

## 2011-03-29 ENCOUNTER — Encounter (HOSPITAL_BASED_OUTPATIENT_CLINIC_OR_DEPARTMENT_OTHER): Payer: BC Managed Care – PPO | Admitting: Oncology

## 2011-03-29 DIAGNOSIS — D649 Anemia, unspecified: Secondary | ICD-10-CM

## 2011-03-29 LAB — CBC WITH DIFFERENTIAL/PLATELET
BASO%: 0.2 % (ref 0.0–2.0)
HCT: 35.3 % (ref 34.8–46.6)
LYMPH%: 28.6 % (ref 14.0–49.7)
MCH: 32.1 pg (ref 25.1–34.0)
MCHC: 33.1 g/dL (ref 31.5–36.0)
MONO#: 0.7 10*3/uL (ref 0.1–0.9)
NEUT%: 58.3 % (ref 38.4–76.8)
Platelets: 215 10*3/uL (ref 145–400)
WBC: 8.3 10*3/uL (ref 3.9–10.3)

## 2011-03-29 LAB — BASIC METABOLIC PANEL
BUN: 15 mg/dL (ref 6–23)
Chloride: 102 mEq/L (ref 96–112)
Creatinine, Ser: 0.67 mg/dL (ref 0.50–1.10)
Glucose, Bld: 87 mg/dL (ref 70–99)

## 2011-06-30 ENCOUNTER — Telehealth: Payer: Self-pay | Admitting: Oncology

## 2011-06-30 NOTE — Telephone Encounter (Signed)
Pt called in to set up an appt with dr ha due to she is needing to switch her medications. Referred the message to cameo the nurse.

## 2011-07-13 ENCOUNTER — Other Ambulatory Visit: Payer: Self-pay | Admitting: Oncology

## 2011-07-13 ENCOUNTER — Telehealth: Payer: Self-pay | Admitting: *Deleted

## 2011-07-13 DIAGNOSIS — Z86718 Personal history of other venous thrombosis and embolism: Secondary | ICD-10-CM

## 2011-07-13 NOTE — Telephone Encounter (Signed)
Received call from pt stating that she would like to speak with Dr. Lamonte Sakai regarding switching from lovenox injections to oral coumadin. Will notify MD. Call back # [phone]

## 2011-07-15 ENCOUNTER — Telehealth: Payer: Self-pay | Admitting: Oncology

## 2011-07-15 NOTE — Telephone Encounter (Signed)
S/w the pt and she is aware of her dec appts

## 2011-08-02 ENCOUNTER — Ambulatory Visit (HOSPITAL_BASED_OUTPATIENT_CLINIC_OR_DEPARTMENT_OTHER): Payer: BC Managed Care – PPO | Admitting: Oncology

## 2011-08-02 ENCOUNTER — Other Ambulatory Visit (HOSPITAL_BASED_OUTPATIENT_CLINIC_OR_DEPARTMENT_OTHER): Payer: BC Managed Care – PPO | Admitting: Lab

## 2011-08-02 VITALS — BP 99/67 | HR 95 | Temp 98.3°F | Ht 64.0 in | Wt 108.1 lb

## 2011-08-02 DIAGNOSIS — Z86718 Personal history of other venous thrombosis and embolism: Secondary | ICD-10-CM

## 2011-08-02 DIAGNOSIS — Z7901 Long term (current) use of anticoagulants: Secondary | ICD-10-CM

## 2011-08-02 DIAGNOSIS — Z8719 Personal history of other diseases of the digestive system: Secondary | ICD-10-CM

## 2011-08-02 LAB — CBC WITH DIFFERENTIAL/PLATELET
BASO%: 0.2 % (ref 0.0–2.0)
EOS%: 2 % (ref 0.0–7.0)
MCH: 31 pg (ref 25.1–34.0)
MCHC: 34 g/dL (ref 31.5–36.0)
MCV: 90.9 fL (ref 79.5–101.0)
MONO%: 8.2 % (ref 0.0–14.0)
NEUT#: 4.8 10*3/uL (ref 1.5–6.5)
RBC: 4.55 10*6/uL (ref 3.70–5.45)
RDW: 12.7 % (ref 11.2–14.5)

## 2011-08-02 NOTE — Progress Notes (Signed)
[facility] [facility] OFFICE PROGRESS NOTE  Cc:  Annette November, MD, MD  DIAGNOSIS AND CURRENT THERAPY:  1.  6-MP induced pancytopenia:  watchful obseravtion with discontinuation of 6-MP with resolution of pancytopenia.  2.  Recurrent DVT's x 2 with the 2nd event being extensive requiring thrombolysis:  On chronic Lovenox.   INTERVAL HISTORY: Annette Hopkins 40 y.o. female returns for regular follow up.  She has been doing well.  She resumed Lialda and has been off of 6-MP.  She has been taking Lovenox; however, her new insurance plan required high deductible for Lovenox.  She and her husband are not sure about having more children at this time.  She is seeking birth control with a gynecologist next week.   Patient denies fatigue, headache, visual changes, confusion, drenching night sweats, palpable lymph node swelling, mucositis, odynophagia, dysphagia, nausea vomiting, jaundice, chest pain, palpitation, shortness of breath, dyspnea on exertion, productive cough, gum bleeding, epistaxis, hematemesis, hemoptysis, abdominal pain, abdominal swelling, early satiety, melena, hematochezia, hematuria, skin rash, spontaneous bleeding, joint swelling, joint pain, heat or cold intolerance, bowel bladder incontinence, back pain, focal motor weakness, paresthesia, depression, suicidal or homocidal ideation, feeling hopelessness.   MEDICAL HISTORY: Past Medical History  Diagnosis Date  . Ulcerative colitis     dx  around 2004  . History of blood clots     DVT, abdomen, on heparin   . RA (rheumatoid arthritis)     ~2005    SURGICAL HISTORY:  Past Surgical History  Procedure Date  . Vascular stents bilateral groins     MEDICATIONS: Current Outpatient Prescriptions  Medication Sig Dispense Refill  . enoxaparin (LOVENOX) 150 MG/ML injection Inject into the skin daily.        . mesalamine (LIALDA) 1.2 G EC tablet Take two tablets by mouth once a day  60 tablet  6   Current Facility-Administered  Medications  Medication Dose Route Frequency Provider Last Rate Last Dose  . 0.9 %  sodium chloride infusion  500 mL Intravenous Continuous Verl Blalock, MD        ALLERGIES:  is allergic to mercaptopurine and sulfa antibiotics.  REVIEW OF SYSTEMS:  The rest of the 14-point review of system was negative.   Filed Vitals:   08/02/11 1233  BP: 99/67  Pulse: 95  Temp: 98.3 F (36.8 C)   Wt Readings from Last 3 Encounters:  08/02/11 108 lb 1.6 oz (49.034 kg)  03/29/11 104 lb 3 oz (47.259 kg)  02/17/11 105 lb (47.628 kg)   ECOG Performance status: 0  PHYSICAL EXAMINATION:   General:  well-nourished in no acute distress.  Eyes:  no scleral icterus.  ENT:  There were no oropharyngeal lesions.  Neck was without thyromegaly.  Lymphatics:  Negative cervical, supraclavicular or axillary adenopathy.  Respiratory: lungs were clear bilaterally without wheezing or crackles.  Cardiovascular:  Regular rate and rhythm, S1/S2, without murmur, rub or gallop.  There was no pedal edema.  GI:  abdomen was soft, flat, nontender, nondistended, without organomegaly.  Muscoloskeletal:  no spinal tenderness of palpation of vertebral spine.  Skin exam was without echymosis, petichae.  Neuro exam was nonfocal.  Patient was able to get on and off exam table without assistance.  Gait was normal.  Patient was alerted and oriented.  Attention was good.   Language was appropriate.  Mood was normal without depression.  Speech was not pressured.  Thought content was not tangential.      LABORATORY/RADIOLOGY DATA:  Lab Results  Component Value Date   WBC 7.2 08/02/2011   HGB 14.1 08/02/2011   HCT 41.3 08/02/2011   PLT 172 08/02/2011   GLUCOSE 87 03/29/2011   ALT 118* 02/16/2011   AST 54* 02/16/2011   NA 136 03/29/2011   K 4.3 03/29/2011   CL 102 03/29/2011   CREATININE 0.67 03/29/2011   BUN 15 03/29/2011   CO2 23 03/29/2011   TSH 1.18 02/16/2011    ASSESSMENT AND PLAN:   1. History of pancytopenia secondary to  use of 6MP.  She has been off that.  Her counts have completely recovered.  I discussed with her there is no concern for any primary bone marrow process.   2. History of ulcerative colitis.  She is on mesalamine per Dr. Sharlett Iles with symptom controlled. 3. History of recurrent deep venous thromboses.  I discussed with her that I again recommended lifelong anticoagulation since the 2nd DVT occurred with her pregnancy when she was on Heparin SQ prophylactic dose.  She required thrombolysis due to the extend of the LE DVT.  Thus, she requires lifelong anticoagulation regardless of her thrombophelia work up.  She has been on Lovenox instead of Coumadin since she and her husband had previously wanted another child.  When she was on Coumadin before, she got pregnant without knowing about it until past week 10.  Now, she and her husband want to go on birth control.  I strongly urged her to avoid oral contraceptives which carry high risk in her case especially of recurrent DVT's.  I advised her to inquire with her gynecologist about IUD vs. Tubal ligation vs. Vasectomy vs. Barrier methods.      With regard to type of anticoagulation, I prefer Coumadin over the newer direct thrombin or factor X inhibitors such as Pradaxa and rivaroxaban; respectively.  These newer anticoagulants do not require close monitoring like Coumadin.  However, there are no antidotes in case patients have bleeding complication.  Thus, I prefer Coumadin at this time.    In the past, she reported that her INR was quite stable with Coumain 71m PO qhs.  I recommended bridging Coumadin with Lovenox while INR is still subtherapeutic since she is at high risk for recurrent DVT's if Coumadin is started without Lovenox bridge.      I discussed options for follow up on Coumadin.  She can certainly follow up with our Coumadin clinic here.  However, since she lives closer to Dr. PEthel Ranaclinic, she prefers to follow up with him.    FOLLOWUP:  [name] was discharged her back to primary care physician.  I gave her my contact number and advised her to contact [place] we can be of any help.

## 2011-08-09 ENCOUNTER — Telehealth: Payer: Self-pay | Admitting: *Deleted

## 2011-08-09 NOTE — Telephone Encounter (Signed)
VM from pt states she is trying to make appt w/ her PCP,  Dr. Larose Kells, to switch to coumadin from lovenox.  She wants coumadin managed by Dr. Larose Kells as it will be more convenient for pt..  She states she called their office and was told she was not a patient there although has been seen there in the past.  This RN attempted to call Dr. Larose Kells office several times today and was placed on hold for over 10 minutes at a time.  Attempting to assist pt in getting an appointment.  Was finally able to speak w/ scheduler who could verfify pt is a established pt there and she can call back to make appt w/ Dr. Larose Kells.  Called pt back and left VM instructing pt to try to call office again to make this appointment. Instructed her to call us back if any problems.

## 2011-08-11 ENCOUNTER — Telehealth: Payer: Self-pay | Admitting: Internal Medicine

## 2011-08-11 NOTE — Telephone Encounter (Signed)
The pt called and is requesting a rx for coumadin.  She also wants to know when to schedule the coumadin check after she starts the med.  Thanks!

## 2011-08-11 NOTE — Telephone Encounter (Signed)
I reviewed hematology note, start Coumadin 5 mg daily. #30, no refills.  Continue with Lovenox as before. INR 4 days after she starts Coumadin

## 2011-08-11 NOTE — Telephone Encounter (Signed)
Spoke with Pt who states that Dr Lamonte Sakai suggested that she start on this med, however Pt has not started it yet but is on  levonox. Pt notes that this regimen is needed for history of blood clots. Please advise if you are ok with starting and managing Pt coumadin.

## 2011-08-12 MED ORDER — WARFARIN SODIUM 5 MG PO TABS: 5.0000 mg | ORAL_TABLET | Freq: Every day | ORAL | Status: DC

## 2011-08-12 NOTE — Telephone Encounter (Signed)
Patient returned phone call. Best # [phone]

## 2011-08-12 NOTE — Telephone Encounter (Signed)
Left message to call office

## 2011-08-12 NOTE — Telephone Encounter (Signed)
Discuss with patient, Rx sent.

## 2011-08-20 ENCOUNTER — Ambulatory Visit: Payer: BC Managed Care – PPO

## 2011-08-27 ENCOUNTER — Ambulatory Visit: Payer: BC Managed Care – PPO

## 2011-09-02 ENCOUNTER — Ambulatory Visit (INDEPENDENT_AMBULATORY_CARE_PROVIDER_SITE_OTHER): Payer: BC Managed Care – PPO | Admitting: *Deleted

## 2011-09-02 VITALS — BP 98/58 | HR 83 | Temp 98.5°F | Wt 110.0 lb

## 2011-09-02 DIAGNOSIS — Z7901 Long term (current) use of anticoagulants: Secondary | ICD-10-CM

## 2011-09-02 LAB — POCT INR: INR: 2.3

## 2011-09-02 NOTE — Patient Instructions (Signed)
Return to office in 2 weeks  Continue current dose: 5 mg daily

## 2011-09-17 ENCOUNTER — Ambulatory Visit (INDEPENDENT_AMBULATORY_CARE_PROVIDER_SITE_OTHER): Payer: BC Managed Care – PPO

## 2011-09-17 DIAGNOSIS — Z86718 Personal history of other venous thrombosis and embolism: Secondary | ICD-10-CM

## 2011-09-17 DIAGNOSIS — Z7901 Long term (current) use of anticoagulants: Secondary | ICD-10-CM

## 2011-09-17 LAB — POCT INR: INR: 1.5

## 2011-09-17 MED ORDER — WARFARIN SODIUM 5 MG PO TABS: 5.0000 mg | ORAL_TABLET | Freq: Every day | ORAL | Status: DC

## 2011-09-17 NOTE — Patient Instructions (Addendum)
Needs to go back to lovenox as before, call a Rx if needed Old  coumadin from 5 mg qd (35 mg w)  New Coumadin 66m: 1 a day and 1.5 tabs Fridays (today) and Tuesday------> Next inr 1 week JP     Patient is aware of changes, she voiced understand and agreed to follow Dr.Recommendations. KRonaldo MiyamotoCma

## 2011-10-01 ENCOUNTER — Ambulatory Visit (INDEPENDENT_AMBULATORY_CARE_PROVIDER_SITE_OTHER): Payer: BC Managed Care – PPO | Admitting: *Deleted

## 2011-10-01 DIAGNOSIS — Z7901 Long term (current) use of anticoagulants: Secondary | ICD-10-CM

## 2011-10-01 DIAGNOSIS — Z86718 Personal history of other venous thrombosis and embolism: Secondary | ICD-10-CM

## 2011-10-01 NOTE — Patient Instructions (Addendum)
Coumadin today continue to be low, 1.4 At the last INR check, I rec  1. lovenox 2. Increase coumadin Did not do lovenox, did not increase coumadin except for the first week after the last check Plan: Restart lovenox, same dose as before, call a Rx  New Coumadin 12m: 1 a day and 1.5 tabs M W F ------> Next inr 1 week JP   Pt states that insurance does not cover lovenox and she can not afford to get med because it is too expensive..Solmon IceDeloach CMA

## 2011-10-08 ENCOUNTER — Ambulatory Visit: Payer: BC Managed Care – PPO

## 2011-10-11 ENCOUNTER — Ambulatory Visit (INDEPENDENT_AMBULATORY_CARE_PROVIDER_SITE_OTHER): Payer: BC Managed Care – PPO | Admitting: *Deleted

## 2011-10-11 DIAGNOSIS — Z86718 Personal history of other venous thrombosis and embolism: Secondary | ICD-10-CM

## 2011-10-11 DIAGNOSIS — Z7901 Long term (current) use of anticoagulants: Secondary | ICD-10-CM

## 2011-10-11 NOTE — Patient Instructions (Addendum)
INR good, no change, 1 week JP.

## 2011-10-18 ENCOUNTER — Ambulatory Visit (INDEPENDENT_AMBULATORY_CARE_PROVIDER_SITE_OTHER): Payer: BC Managed Care – PPO | Admitting: *Deleted

## 2011-10-18 VITALS — BP 90/60 | HR 78 | Wt 113.0 lb

## 2011-10-18 DIAGNOSIS — Z86718 Personal history of other venous thrombosis and embolism: Secondary | ICD-10-CM

## 2011-10-18 DIAGNOSIS — Z7901 Long term (current) use of anticoagulants: Secondary | ICD-10-CM

## 2011-10-18 LAB — POCT INR: INR: 2.3

## 2011-10-18 NOTE — Patient Instructions (Addendum)
No change, 3 weeks JP

## 2011-10-22 ENCOUNTER — Other Ambulatory Visit: Payer: Self-pay | Admitting: Internal Medicine

## 2011-10-22 NOTE — Telephone Encounter (Signed)
Pt left VM stating that she was calling for a refill. Called Pt back to advise that Rx has been sent to pharmacy. Little girl answer phone when inform her that it was Dr Larose Kells she hung up, then I called again and was hung up on again.

## 2011-10-22 NOTE — Telephone Encounter (Signed)
Refill done.  

## 2011-11-09 ENCOUNTER — Ambulatory Visit (INDEPENDENT_AMBULATORY_CARE_PROVIDER_SITE_OTHER): Payer: BC Managed Care – PPO | Admitting: *Deleted

## 2011-11-09 VITALS — BP 90/56 | HR 89 | Temp 98.4°F | Wt 113.0 lb

## 2011-11-09 DIAGNOSIS — Z86718 Personal history of other venous thrombosis and embolism: Secondary | ICD-10-CM

## 2011-11-09 DIAGNOSIS — Z7901 Long term (current) use of anticoagulants: Secondary | ICD-10-CM

## 2011-11-09 LAB — POCT INR: INR: 1.9

## 2011-11-09 NOTE — Patient Instructions (Signed)
Current dose-- Coumadin 5 mg one tablet daily and 1.5 tablets Monday Wednesday and Friday--- 42.5 New dose: Coumadin 5 mg : 1.5 tablet every day, one tablet Monday Wednesday and Friday ---45 mg weekly Next check 2 weeks JP

## 2011-11-15 ENCOUNTER — Encounter: Payer: Self-pay | Admitting: *Deleted

## 2011-11-17 ENCOUNTER — Other Ambulatory Visit: Payer: Self-pay | Admitting: Internal Medicine

## 2011-11-17 NOTE — Telephone Encounter (Signed)
Refill done.  

## 2011-11-19 ENCOUNTER — Encounter: Payer: Self-pay | Admitting: Gastroenterology

## 2011-11-19 ENCOUNTER — Ambulatory Visit (INDEPENDENT_AMBULATORY_CARE_PROVIDER_SITE_OTHER): Payer: BC Managed Care – PPO | Admitting: Gastroenterology

## 2011-11-19 VITALS — BP 90/60 | HR 88 | Ht 64.0 in | Wt 112.2 lb

## 2011-11-19 DIAGNOSIS — K5190 Ulcerative colitis, unspecified, without complications: Secondary | ICD-10-CM

## 2011-11-19 DIAGNOSIS — T50905A Adverse effect of unspecified drugs, medicaments and biological substances, initial encounter: Secondary | ICD-10-CM

## 2011-11-19 DIAGNOSIS — T887XXA Unspecified adverse effect of drug or medicament, initial encounter: Secondary | ICD-10-CM

## 2011-11-19 DIAGNOSIS — D689 Coagulation defect, unspecified: Secondary | ICD-10-CM

## 2011-11-19 MED ORDER — MESALAMINE 1.2 G PO TBEC: DELAYED_RELEASE_TABLET | ORAL | Status: DC

## 2011-11-19 NOTE — Patient Instructions (Signed)
Your prescription(s) have been sent to you pharmacy.  Your next Colonoscopy is due 11/2011 we will mail you a letter to remind you.  Crohns & Colitis information given today.

## 2011-11-19 NOTE — Progress Notes (Signed)
This is a very pleasant Caucasian female with 10 years of ulcerative colitis. She was placed on 6-MP one year ago and developed severe pancytopenia requiring hematology referral. Her pancytopenia resolved with discontinuation of 6-MP, and she currently is on Lialda 2.4 g a day. She specifically denies diarrhea, abdominal pain, rectal bleeding, upper GI or hepatobiliary complaints. In the past she has had acid reflux which responded to PPI therapy, and upper abdominal ultrasound exam showed no evidence of cholelithiasis. She denies systemic complaints, but does have a long history of recurrent DVTs, and is currently on Coumadin 5 mg a day with INR of between 2 and 3. She denies any peripheral vascular cardiovascular problems. Her appetite is good and her weight is stable without any food intolerances. There is no history of arthritis, skin rashes, or oral stomatitis.  Current Medications, Allergies, Past Medical History, Past Surgical History, Family History and Social History were reviewed in [employer] record.  Pertinent Review of Systems Negative   Physical Exam: Healthy patient no distress. Blood pressure 90/60, pulse 80 and regular, weight 112 pounds, BMI 19.27. I cannot appreciate stigmata of chronic liver disease. She appears very irregular rhythm without murmurs gallops or rubs, chest is clear. There is no organomegaly, no abdominal masses or tenderness. Bowel sounds are normal. Mental status is normal.    Assessment and Plan: Ulcerative colitis in remission on oral aminosialyicate therapy. We will continue her current medications with colonoscopy followup and dysplasia screening in 1 year. Review of her labs from hematology show resolution of her pancytopenia, she is followed by Dr. Lamonte Sakai for her hypercoagulability problems. She is not a candidate for immunosuppressive therapy in the future. No diagnosis found.

## 2011-11-23 ENCOUNTER — Ambulatory Visit (INDEPENDENT_AMBULATORY_CARE_PROVIDER_SITE_OTHER): Payer: BC Managed Care – PPO

## 2011-11-23 VITALS — BP 90/70 | HR 72 | Temp 98.2°F | Wt 112.0 lb

## 2011-11-23 DIAGNOSIS — Z7901 Long term (current) use of anticoagulants: Secondary | ICD-10-CM

## 2011-11-23 DIAGNOSIS — D689 Coagulation defect, unspecified: Secondary | ICD-10-CM

## 2011-11-23 NOTE — Patient Instructions (Signed)
INR 2.0 Current dose Coumadin 5 mg : 1.5 tablet every day, one tablet Monday Wednesday and Friday ---45 mg weekly  New dose: Coumadin 5 mg-- 1.5 tablets every day, one tablet Tuesday and Friday--- 47.5 mg/week Next INR: 3 weeks  JP

## 2011-11-30 ENCOUNTER — Ambulatory Visit: Payer: BC Managed Care – PPO | Admitting: Gastroenterology

## 2011-12-14 ENCOUNTER — Ambulatory Visit (INDEPENDENT_AMBULATORY_CARE_PROVIDER_SITE_OTHER): Payer: BC Managed Care – PPO

## 2011-12-14 VITALS — BP 118/66 | HR 76 | Wt 113.2 lb

## 2011-12-14 DIAGNOSIS — Z86718 Personal history of other venous thrombosis and embolism: Secondary | ICD-10-CM

## 2011-12-14 DIAGNOSIS — Z7901 Long term (current) use of anticoagulants: Secondary | ICD-10-CM

## 2011-12-14 MED ORDER — WARFARIN SODIUM 5 MG PO TABS: ORAL_TABLET | ORAL | Status: DC

## 2011-12-14 NOTE — Patient Instructions (Signed)
No change in medication and recheck in 4 week

## 2012-01-11 ENCOUNTER — Ambulatory Visit (INDEPENDENT_AMBULATORY_CARE_PROVIDER_SITE_OTHER): Payer: BC Managed Care – PPO | Admitting: *Deleted

## 2012-01-11 VITALS — BP 112/68 | HR 85

## 2012-01-11 DIAGNOSIS — Z86718 Personal history of other venous thrombosis and embolism: Secondary | ICD-10-CM

## 2012-01-11 DIAGNOSIS — Z7901 Long term (current) use of anticoagulants: Secondary | ICD-10-CM

## 2012-01-11 LAB — POCT INR: INR: 2.2

## 2012-01-12 ENCOUNTER — Ambulatory Visit: Payer: BC Managed Care – PPO

## 2012-01-18 ENCOUNTER — Other Ambulatory Visit: Payer: Self-pay | Admitting: Internal Medicine

## 2012-01-19 NOTE — Telephone Encounter (Signed)
Refill done.  

## 2012-02-10 ENCOUNTER — Ambulatory Visit (INDEPENDENT_AMBULATORY_CARE_PROVIDER_SITE_OTHER): Payer: BC Managed Care – PPO | Admitting: *Deleted

## 2012-02-10 VITALS — BP 90/56 | HR 80 | Temp 97.7°F | Wt 112.0 lb

## 2012-02-10 DIAGNOSIS — Z86718 Personal history of other venous thrombosis and embolism: Secondary | ICD-10-CM

## 2012-02-10 DIAGNOSIS — Z7901 Long term (current) use of anticoagulants: Secondary | ICD-10-CM

## 2012-02-10 LAB — POCT INR: INR: 2.7

## 2012-02-10 NOTE — Patient Instructions (Signed)
INR today 2.7  Return to office in 4 weeks  No change continue current dose: 7.5 mg daily except 5 mg on Tuesday and Thursday

## 2012-02-15 ENCOUNTER — Encounter: Payer: BC Managed Care – PPO | Admitting: Internal Medicine

## 2012-02-21 ENCOUNTER — Encounter: Payer: Self-pay | Admitting: Internal Medicine

## 2012-02-21 ENCOUNTER — Ambulatory Visit (INDEPENDENT_AMBULATORY_CARE_PROVIDER_SITE_OTHER): Payer: BC Managed Care – PPO | Admitting: Internal Medicine

## 2012-02-21 VITALS — BP 108/68 | HR 72 | Temp 98.3°F | Ht 65.5 in | Wt 113.0 lb

## 2012-02-21 DIAGNOSIS — M069 Rheumatoid arthritis, unspecified: Secondary | ICD-10-CM

## 2012-02-21 DIAGNOSIS — Z0000 Encounter for general adult medical examination without abnormal findings: Secondary | ICD-10-CM

## 2012-02-21 DIAGNOSIS — M8580 Other specified disorders of bone density and structure, unspecified site: Secondary | ICD-10-CM | POA: Insufficient documentation

## 2012-02-21 DIAGNOSIS — Z23 Encounter for immunization: Secondary | ICD-10-CM

## 2012-02-21 LAB — CBC WITH DIFFERENTIAL/PLATELET
Basophils Absolute: 0 10*3/uL (ref 0.0–0.1)
Basophils Relative: 0.2 % (ref 0.0–3.0)
Eosinophils Relative: 1.1 % (ref 0.0–5.0)
HCT: 40.8 % (ref 36.0–46.0)
Hemoglobin: 13.8 g/dL (ref 12.0–15.0)
Lymphocytes Relative: 25.4 % (ref 12.0–46.0)
Lymphs Abs: 1.7 10*3/uL (ref 0.7–4.0)
Monocytes Relative: 7.8 % (ref 3.0–12.0)
Neutro Abs: 4.3 10*3/uL (ref 1.4–7.7)
RBC: 4.51 Mil/uL (ref 3.87–5.11)
WBC: 6.6 10*3/uL (ref 4.5–10.5)

## 2012-02-21 LAB — LIPID PANEL
HDL: 54.1 mg/dL (ref >39.00)
LDL Cholesterol: 79 mg/dL (ref 0–99)
Total CHOL/HDL Ratio: 3
VLDL: 28.6 mg/dL (ref 0.0–40.0)

## 2012-02-21 LAB — COMPREHENSIVE METABOLIC PANEL
ALT: 22 U/L (ref 0–35)
CO2: 27 mEq/L (ref 19–32)
Calcium: 9.3 mg/dL (ref 8.4–10.5)
Chloride: 102 mEq/L (ref 96–112)
Creatinine, Ser: 0.6 mg/dL (ref 0.4–1.2)
GFR: 118.02 mL/min (ref >60.00)
Glucose, Bld: 87 mg/dL (ref 70–99)
Total Bilirubin: 0.6 mg/dL (ref 0.3–1.2)

## 2012-02-21 MED ORDER — WARFARIN SODIUM 5 MG PO TABS: 5.0000 mg | ORAL_TABLET | Freq: Every day | ORAL | Status: DC

## 2012-02-21 NOTE — Assessment & Plan Note (Addendum)
,   Td ~ 10, Tdap today Flu shot this year recommended  Female care per gynecologist, used to see Dr Quincy Simmonds, now sees one of her partners  Labs

## 2012-02-21 NOTE — Assessment & Plan Note (Signed)
Not on an issue at this point, has not seen rheumatology in a while

## 2012-02-21 NOTE — Assessment & Plan Note (Addendum)
bone density test in 2009 showed T score of -2.1. Repeat a bone density test

## 2012-02-21 NOTE — Patient Instructions (Signed)
Next Coumadin check around 03/12/2012 Next office visit in one year

## 2012-02-21 NOTE — Progress Notes (Signed)
  Subjective:    Patient ID: Annette Hopkins, female    DOB: 16-Oct-1970, 41 y.o.   MRN: [id number]  HPI Complete physical exam  Past medical history Ulcerative colitis diagnosed 2004 History of clots: B leg DVT 2005, abdomen. On Coumadin Rheumatoid arthritis diagnosed 2005  Past surgical history Vascular stents lower extremities bilaterally 2006  Social history Married, 1 child. 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 No nausea, vomiting, diarrhea or blood in the stools. No dysphagia or odynophagia. No dysuria or gross hematuria.     Objective:   Physical Exam General -- alert, well-developed, and well-nourished.   Neck --no thyromegaly Lungs -- normal respiratory effort, no intercostal retractions, no accessory muscle use, and normal breath sounds.   Heart-- normal rate, regular rhythm, no murmur, and no gallop.   Abdomen--soft, non-tender, no distention, no masses, no HSM, no guarding, and no rigidity.   Extremities-- no pretibial edema bilaterally Neurologic-- alert & oriented X3 and strength normal in all extremities. Psych-- Cognition and judgment appear intact. Alert and cooperative with normal attention span and concentration.  not anxious appearing and not depressed appearing.       Assessment & Plan:

## 2012-02-22 ENCOUNTER — Encounter: Payer: Self-pay | Admitting: *Deleted

## 2012-02-22 LAB — VITAMIN D 25 HYDROXY (VIT D DEFICIENCY, FRACTURES): Vit D, 25-Hydroxy: 35 ng/mL (ref 30–89)

## 2012-03-01 ENCOUNTER — Ambulatory Visit (INDEPENDENT_AMBULATORY_CARE_PROVIDER_SITE_OTHER)
Admission: RE | Admit: 2012-03-01 | Discharge: 2012-03-01 | Disposition: A | Discharge status: Home / Self Care | Payer: BC Managed Care – PPO | Source: Ambulatory Visit

## 2012-03-01 DIAGNOSIS — M949 Disorder of cartilage, unspecified: Secondary | ICD-10-CM

## 2012-03-01 DIAGNOSIS — M899 Disorder of bone, unspecified: Secondary | ICD-10-CM

## 2012-03-01 DIAGNOSIS — M8580 Other specified disorders of bone density and structure, unspecified site: Secondary | ICD-10-CM

## 2012-03-10 ENCOUNTER — Ambulatory Visit (INDEPENDENT_AMBULATORY_CARE_PROVIDER_SITE_OTHER): Payer: BC Managed Care – PPO | Admitting: *Deleted

## 2012-03-10 VITALS — BP 108/68 | HR 84

## 2012-03-10 DIAGNOSIS — Z7901 Long term (current) use of anticoagulants: Secondary | ICD-10-CM

## 2012-03-10 DIAGNOSIS — Z86718 Personal history of other venous thrombosis and embolism: Secondary | ICD-10-CM

## 2012-04-10 ENCOUNTER — Ambulatory Visit (INDEPENDENT_AMBULATORY_CARE_PROVIDER_SITE_OTHER): Payer: BC Managed Care – PPO | Admitting: *Deleted

## 2012-04-10 VITALS — BP 86/58 | HR 80 | Wt 114.0 lb

## 2012-04-10 DIAGNOSIS — Z86718 Personal history of other venous thrombosis and embolism: Secondary | ICD-10-CM

## 2012-04-10 DIAGNOSIS — Z7901 Long term (current) use of anticoagulants: Secondary | ICD-10-CM

## 2012-04-10 NOTE — Patient Instructions (Signed)
Return to office in 4 weeks  Continue current dose: 7.5 mg daily except 5 mg on Tuesday and Thursday

## 2012-04-20 ENCOUNTER — Ambulatory Visit (HOSPITAL_BASED_OUTPATIENT_CLINIC_OR_DEPARTMENT_OTHER)
Admission: RE | Admit: 2012-04-20 | Discharge: 2012-04-20 | Disposition: A | Discharge status: Home / Self Care | Payer: BC Managed Care – PPO | Source: Ambulatory Visit | Attending: Internal Medicine | Admitting: Internal Medicine

## 2012-04-20 ENCOUNTER — Encounter: Payer: Self-pay | Admitting: Internal Medicine

## 2012-04-20 ENCOUNTER — Ambulatory Visit (INDEPENDENT_AMBULATORY_CARE_PROVIDER_SITE_OTHER): Payer: BC Managed Care – PPO | Admitting: Internal Medicine

## 2012-04-20 VITALS — BP 104/68 | HR 78 | Temp 98.2°F | Wt 115.0 lb

## 2012-04-20 DIAGNOSIS — R51 Headache: Secondary | ICD-10-CM | POA: Insufficient documentation

## 2012-04-20 DIAGNOSIS — M2560 Stiffness of unspecified joint, not elsewhere classified: Secondary | ICD-10-CM | POA: Insufficient documentation

## 2012-04-20 DIAGNOSIS — Z7901 Long term (current) use of anticoagulants: Secondary | ICD-10-CM | POA: Insufficient documentation

## 2012-04-20 DIAGNOSIS — S139XXA Sprain of joints and ligaments of unspecified parts of neck, initial encounter: Secondary | ICD-10-CM

## 2012-04-20 DIAGNOSIS — R519 Headache, unspecified: Secondary | ICD-10-CM | POA: Insufficient documentation

## 2012-04-20 DIAGNOSIS — J3489 Other specified disorders of nose and nasal sinuses: Secondary | ICD-10-CM | POA: Insufficient documentation

## 2012-04-20 NOTE — Patient Instructions (Addendum)
Tylenol as needed for pain, warm compress as needed Call anytime if the neck pain or headache get worse or if you have nausea vomiting. Also call if you have persistent tingling in the fingers
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
## 2012-05-07 ENCOUNTER — Telehealth: Payer: Self-pay | Admitting: Internal Medicine

## 2012-05-07 NOTE — Telephone Encounter (Signed)
Advise patient, recent bone density test showed osteopenia, T score -2.0 which is a stable compared to previous bone density test. In addition to weight bearing exercises, calcium and vitamin D, she would benefit from medication such as Fosamax; please let me know if she is interested, we could always discuss results in detail when she comes back for a OV

## 2012-05-08 ENCOUNTER — Ambulatory Visit: Payer: BC Managed Care – PPO

## 2012-05-08 NOTE — Telephone Encounter (Signed)
lmovm for pt to return call.  

## 2012-05-11 ENCOUNTER — Ambulatory Visit: Payer: BC Managed Care – PPO

## 2012-05-11 NOTE — Telephone Encounter (Signed)
lmovm for pt to return call.  

## 2012-05-15 ENCOUNTER — Ambulatory Visit (INDEPENDENT_AMBULATORY_CARE_PROVIDER_SITE_OTHER): Payer: BC Managed Care – PPO | Admitting: *Deleted

## 2012-05-15 VITALS — BP 108/64 | HR 79

## 2012-05-15 DIAGNOSIS — Z86718 Personal history of other venous thrombosis and embolism: Secondary | ICD-10-CM

## 2012-05-15 DIAGNOSIS — Z7901 Long term (current) use of anticoagulants: Secondary | ICD-10-CM

## 2012-05-15 LAB — POCT INR: INR: 2.2

## 2012-05-15 NOTE — Telephone Encounter (Signed)
Discussed with pt

## 2012-05-15 NOTE — Patient Instructions (Signed)
Continue taking 7.20m daily & 550mon Tuesday & Friday. Come back in 4 weeks.

## 2012-06-12 ENCOUNTER — Ambulatory Visit (INDEPENDENT_AMBULATORY_CARE_PROVIDER_SITE_OTHER): Payer: BC Managed Care – PPO | Admitting: *Deleted

## 2012-06-12 VITALS — BP 102/64 | HR 77

## 2012-06-12 DIAGNOSIS — Z86718 Personal history of other venous thrombosis and embolism: Secondary | ICD-10-CM

## 2012-06-12 DIAGNOSIS — Z7901 Long term (current) use of anticoagulants: Secondary | ICD-10-CM

## 2012-06-12 LAB — POCT INR: INR: 2

## 2012-06-12 NOTE — Patient Instructions (Signed)
No change. Continue taking 7.82m daily. 531mon Tuesday & Friday. Come back in 4 weeks.

## 2012-07-10 ENCOUNTER — Ambulatory Visit (INDEPENDENT_AMBULATORY_CARE_PROVIDER_SITE_OTHER): Payer: BC Managed Care – PPO | Admitting: *Deleted

## 2012-07-10 VITALS — BP 104/62 | HR 77

## 2012-07-10 DIAGNOSIS — Z86718 Personal history of other venous thrombosis and embolism: Secondary | ICD-10-CM

## 2012-07-10 DIAGNOSIS — Z7901 Long term (current) use of anticoagulants: Secondary | ICD-10-CM

## 2012-07-10 NOTE — Patient Instructions (Signed)
Take 7.46m daily & 572mMonday, Wednesday, & Friday. Come back in 3 weeks.

## 2012-07-31 ENCOUNTER — Ambulatory Visit (INDEPENDENT_AMBULATORY_CARE_PROVIDER_SITE_OTHER): Payer: BC Managed Care – PPO | Admitting: *Deleted

## 2012-07-31 VITALS — BP 108/68 | HR 76

## 2012-07-31 DIAGNOSIS — Z86718 Personal history of other venous thrombosis and embolism: Secondary | ICD-10-CM

## 2012-07-31 DIAGNOSIS — Z7901 Long term (current) use of anticoagulants: Secondary | ICD-10-CM

## 2012-07-31 LAB — POCT INR: INR: 1.5

## 2012-07-31 NOTE — Patient Instructions (Addendum)
inr 1.5 She was supposed to be taking  7.36m daily & 556mMonday, Wednesday, & Friday but she is actually taking nonetheless. 7.5 mg twice a week and 5 mg daily. Plan: Go back to her original dose of  7.61m46maily & 61mg21mnday, Wednesday, & Friday. Come back in 2 weeks. JP

## 2012-08-02 ENCOUNTER — Other Ambulatory Visit: Payer: Self-pay | Admitting: Internal Medicine

## 2012-08-03 NOTE — Telephone Encounter (Signed)
Refill done.  

## 2012-08-14 ENCOUNTER — Ambulatory Visit (INDEPENDENT_AMBULATORY_CARE_PROVIDER_SITE_OTHER): Payer: BC Managed Care – PPO | Admitting: *Deleted

## 2012-08-14 VITALS — BP 86/58 | HR 80 | Wt 117.0 lb

## 2012-08-14 DIAGNOSIS — Z86718 Personal history of other venous thrombosis and embolism: Secondary | ICD-10-CM

## 2012-08-14 DIAGNOSIS — Z7901 Long term (current) use of anticoagulants: Secondary | ICD-10-CM

## 2012-08-14 NOTE — Patient Instructions (Signed)
Return to office in 3 weeks  Continue dosing: 7.5 mg daily except 5 mg on Monday, Wednesday, & Friday

## 2012-09-04 ENCOUNTER — Ambulatory Visit: Payer: BC Managed Care – PPO

## 2012-09-11 ENCOUNTER — Ambulatory Visit (INDEPENDENT_AMBULATORY_CARE_PROVIDER_SITE_OTHER): Payer: BC Managed Care – PPO | Admitting: *Deleted

## 2012-09-11 VITALS — BP 90/62 | Wt 120.0 lb

## 2012-09-11 DIAGNOSIS — Z86718 Personal history of other venous thrombosis and embolism: Secondary | ICD-10-CM

## 2012-09-11 DIAGNOSIS — Z7901 Long term (current) use of anticoagulants: Secondary | ICD-10-CM

## 2012-09-11 NOTE — Patient Instructions (Addendum)
Return to office in 3 weeks  New dosing: Take extra 5 mg today (10 mg) then resume 7.5 mg daily except 5 mg M,W,F

## 2012-09-22 ENCOUNTER — Encounter: Payer: Self-pay | Admitting: Gastroenterology

## 2012-10-03 ENCOUNTER — Ambulatory Visit: Payer: BC Managed Care – PPO

## 2012-10-11 ENCOUNTER — Ambulatory Visit (INDEPENDENT_AMBULATORY_CARE_PROVIDER_SITE_OTHER): Payer: BC Managed Care – PPO | Admitting: *Deleted

## 2012-10-11 VITALS — BP 86/68 | HR 73

## 2012-10-11 DIAGNOSIS — Z86718 Personal history of other venous thrombosis and embolism: Secondary | ICD-10-CM

## 2012-10-11 DIAGNOSIS — Z7901 Long term (current) use of anticoagulants: Secondary | ICD-10-CM

## 2012-10-11 NOTE — Patient Instructions (Signed)
Return to office in 4 weeks  Continue current dosing: 7.5 mg daily except 5 mg M,W,F

## 2012-10-13 ENCOUNTER — Encounter: Payer: Self-pay | Admitting: Gastroenterology

## 2012-10-13 ENCOUNTER — Ambulatory Visit (INDEPENDENT_AMBULATORY_CARE_PROVIDER_SITE_OTHER): Payer: BC Managed Care – PPO | Admitting: Physician Assistant

## 2012-10-13 ENCOUNTER — Encounter: Payer: Self-pay | Admitting: Physician Assistant

## 2012-10-13 VITALS — BP 112/64 | HR 70 | Ht 64.0 in | Wt 118.0 lb

## 2012-10-13 DIAGNOSIS — K5120 Ulcerative (chronic) proctitis without complications: Secondary | ICD-10-CM

## 2012-10-13 DIAGNOSIS — K5190 Ulcerative colitis, unspecified, without complications: Secondary | ICD-10-CM

## 2012-10-13 DIAGNOSIS — Z7901 Long term (current) use of anticoagulants: Secondary | ICD-10-CM

## 2012-10-13 MED ORDER — MOVIPREP 100 G PO SOLR: 1.0000 | Freq: Once | ORAL | Status: DC

## 2012-10-13 NOTE — Patient Instructions (Addendum)
We sent a prescription for the Moviprep for the colonoscopy to [address]. We will contact you once we get a response from the coumadin clinic at [facility] regarding the coumadin medication.   You have been scheduled for a colonoscopy with propofol. Please follow written instructions given to you at your visit today.  Please pick up your prep kit at the pharmacy within the next 1-3 days. If you use inhalers (even only as needed), please bring them with you on the day of your procedure.

## 2012-10-13 NOTE — Progress Notes (Signed)
Subjective:    Patient ID: Annette Hopkins, female    DOB: 1970-12-11, 42 y.o.   MRN: [id number]  HPI  Annette Hopkins is a pleasant 42 year old white female known to Dr. 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 She has been maintained on Lialda 1.2 g twice daily over the past few years and has done very well . She comes in today to discuss followup colonoscopy for neoplasia screening. She says she's not having any active symptoms. She specifically denies any abdominal pain or cramping. She is having normal bowel movements and has not noted any melena or hematochezia. She does have a hypercoagulable state and has had problems with recurrent DVTs. Her last event was an extensive clot that  required a thrombolysis. She is being maintained on chronic Coumadin. She is followed by Dr. Lamonte Sakai - hematology.   Review of Systems  Constitutional: Negative.   HENT: Negative.   Eyes: Negative.   Respiratory: Negative.   Cardiovascular: Negative.   Gastrointestinal: Negative.   Endocrine: Negative.   Genitourinary: Negative.   Allergic/Immunologic: Negative.   Neurological: Negative.   Hematological: Negative.   Psychiatric/Behavioral: Negative.    Outpatient Prescriptions Prior to Visit  Medication Sig Dispense Refill  . mesalamine (LIALDA) 1.2 G EC tablet Take two tablets by mouth once a day  60 tablet  11  . Multiple Vitamin (MULTIVITAMIN) capsule Take 1 capsule by mouth daily.      Marland Kitchen warfarin (COUMADIN) 5 MG tablet TAKE 1 TABLET BY MOUTH DAILY  45 tablet  2   Facility-Administered Medications Prior to Visit  Medication Dose Route Frequency Annette Hopkins Last Rate Last Dose  . 0.9 %  sodium chloride infusion  500 mL Intravenous Continuous Sable Feil,  MD       Allergies  Allergen Reactions  . Mercaptopurine Nausea And Vomiting  . Sulfa Antibiotics    Patient Active Problem List  Diagnosis  . Ulcerative colitis  . History of blood clots  . RA (rheumatoid arthritis)  . Flank pain  . Annual physical exam  . Transaminitis  . Nausea & vomiting  . UC (ulcerative colitis)  . Coagulation defect  . Abnormal liver function tests  . GERD (gastroesophageal reflux disease)  . Osteopenia  . Headache   History  Substance Use Topics  . Smoking status: Never Smoker   . Smokeless tobacco: Never Used  . Alcohol Use: Yes    family history includes Breast cancer in her mother; Colon cancer in her paternal grandmother; and Colon polyps in her paternal grandmother.     Objective:   Physical Exam well-developed thin white female in no acute distress, pleasant blood pressure 112/64 pulse 70 height 5 foot 4 weight 118. HEENT; nontraumatic normocephalic EOMI PERRLA sclera anicteric,Neck; Supple no JVD, Cardiovascula;r regular rate and rhythm with S1-S2 no murmur rub or gallop, Pulmonary ;clear bilaterally, Abdomen; soft nontender nondistended bowel sounds are active there is no palpable mass or hepatomegaly, Rectal ;exam not done, Ext; no clubbing cyanosis or edema skin warm and dry, Psych; mood and affect normal and appropriate        Assessment & Plan:  #32  42 year old female with long history of universal ulcerative colitis, currently well controlled with low-dose Lialda. She is due  for follow up colon neoplasia surveillance, last colonoscopy 2009. #2 hypercoagulable disorder with recurrent DVTs #3 chronic anticoagulation with Coumadin #4 rheumatoid arthritis  Plan; schedule for colonoscopy with Dr. Gus Puma  was discussed in detail with the patient and she is agreeable to proceed. She will need to come off of her Coumadin for 5 days prior to the procedure, and expect that hematology will recommend bridging with Lovenox which can  be arranged through the Coumadin clinic at [facility] where she is a patient. Continue Lialda 1.2 g by mouth twice daily

## 2012-10-16 ENCOUNTER — Telehealth: Payer: Self-pay | Admitting: Internal Medicine

## 2012-10-16 ENCOUNTER — Other Ambulatory Visit: Payer: Self-pay | Admitting: *Deleted

## 2012-10-16 NOTE — Telephone Encounter (Signed)
Annette Hopkins , in reference to your question about stopping Coumadin on Annette Hopkins,  that is okay since she is having endoscopy, please stop the Coumadin for the least amount possible and start it back right after the procedure.

## 2012-10-17 ENCOUNTER — Telehealth: Payer: Self-pay | Admitting: *Deleted

## 2012-10-17 MED ORDER — ENOXAPARIN SODIUM 60 MG/0.6ML ~~LOC~~ SOLN: 60.0000 mg | Freq: Two times a day (BID) | SUBCUTANEOUS | Status: DC

## 2012-10-17 NOTE — Telephone Encounter (Signed)
Error on previous comment.  Asked Julienne Kass to call me after she speak to Dr. Larose Kells.

## 2012-10-17 NOTE — Telephone Encounter (Signed)
Message copied by Tonette Bihari on Tue Oct 17, 2012 10:30 AM ------      Message from: HA, Trudee Grip T      Created: Tue Oct 17, 2012  9:34 AM       Pre-procedure, about  5 days off of Coumadin.  No Lovenox coverage.            Post procedure, given how extensive her previous DVT were, she does need Lovenox bridging (6m/kg SQ BID) with Coumadin until INR is therapeutic at 2; then D/C Lovenox and just continue Coumadin as she has been doing with Dr. PLarose Kells             Dr. HLamonte Sakai       ----- Message -----         From: PTonette Bihari CMA         Sent: 10/17/2012   9:17 AM           To: HNobie Putnam MD            Dr. HLamonte Sakai      [facility] saw that you have not seen this patient in a good while, however, she just wanted to be sure the patient didn't need bridged with Lovenox.  We did get a response from Dr. PLarose Kells       Thanks for your time in reviewing this.            PMarisue HumbleCMA      Amy ETrellis PaganiniPA-C      ----- Message -----         From: HNobie Putnam MD         Sent: 10/16/2012   4:59 PM           To: PTonette Bihari CMA            I do not manage her Coumadin.  I have not seen her for 2 year.  She saw me one time with regard to duration of Coumadin; which was lifelong in my opinion.            Please contact her PCP Dr. PLarose Kellswho has been managing her Coumadin.             Thanks.                  Dr. HLamonte Sakai             ----- Message -----         From: PTonette Bihari CMA         Sent: 10/16/2012   4:55 PM           To: HNobie Putnam MD            10/16/2012                        RE: LDeneice Wack     DOB: [birth year]     MRN: [id number]                 Dear HLamonte Sakai                   We have scheduled the above patient for an endoscopic procedure. Our records show that she is on anticoagulation therapy.             Please advise as to how long the patient may come off her  therapy of Coumadin prior to the Colonoscopy procedure, which is scheduled for 10-30-2012. Do you feel she  needs bridged with Lovenox?             Please fax back/ or route the completed form to [place] att [phone] .             Sincerely,                  Marisue Humble CMA                        Amy [place] PA-C                               ------

## 2012-10-17 NOTE — Telephone Encounter (Signed)
Spoke with Marisue Humble, CMA at [facility] wanting to know who will be ordering and managing the Lovenox bridge for this patient until she has her colonoscopy on 10/30/12. We have been managing her coumadin at [facility], are we going to manage Lovenox as well? Please advise

## 2012-10-17 NOTE — Telephone Encounter (Signed)
Discussed with pt, faxed to GI.

## 2012-10-17 NOTE — Telephone Encounter (Signed)
lmovm for pt to return call.  

## 2012-10-17 NOTE — Telephone Encounter (Signed)
Annette Hopkins, advise pt: Stop coumadin 5 days prior to cscope Restart coumadin the day after the procedure along w/ lovenox lovenox Rx sent OV here for INR check 4 days after procedure Please send copy of note to GI

## 2012-10-17 NOTE — Telephone Encounter (Signed)
Called and advised Julienne Kass RN  Team Leader at [facility] of the information we got from Dr. Lamonte Sakai . He thinks we should bridge the patient with Lovenox after the colonoscopy. I asked her to call me after she speaks to Dr. Lamonte Sakai.

## 2012-10-18 NOTE — Telephone Encounter (Signed)
Received an update from Dr. Larose Kells.  Pt is to stop coumadin 5 days prior to the procedure.  Restart the coumadin the day after the procedure along with Lovenox. The Lovenox prescription is sent to the pharmacy.  Office visit is here for the INR check 4 days after the procedure.

## 2012-10-19 ENCOUNTER — Telehealth: Payer: Self-pay | Admitting: *Deleted

## 2012-10-19 NOTE — Telephone Encounter (Signed)
Patient contacted by Julienne Kass RN regarding the anticoagulation therapy. The patient will hold coumadin 5 days prior to the procedure and will then day after the procedure the patient will be bridged with Lovenox and restart the coumadin.

## 2012-10-30 ENCOUNTER — Encounter: Payer: Self-pay | Admitting: Gastroenterology

## 2012-10-30 ENCOUNTER — Ambulatory Visit (AMBULATORY_SURGERY_CENTER): Payer: BC Managed Care – PPO | Admitting: Gastroenterology

## 2012-10-30 VITALS — BP 109/67 | HR 64 | Temp 99.2°F | Resp 15 | Ht 64.0 in | Wt 118.0 lb

## 2012-10-30 DIAGNOSIS — K5289 Other specified noninfective gastroenteritis and colitis: Secondary | ICD-10-CM

## 2012-10-30 DIAGNOSIS — Z1211 Encounter for screening for malignant neoplasm of colon: Secondary | ICD-10-CM

## 2012-10-30 DIAGNOSIS — K5120 Ulcerative (chronic) proctitis without complications: Secondary | ICD-10-CM

## 2012-10-30 MED ORDER — SODIUM CHLORIDE 0.9 % IV SOLN: 500.0000 mL | INTRAVENOUS | Status: DC

## 2012-10-30 NOTE — Op Note (Signed)
[place]  [address] [place], [address]   COLONOSCOPY PROCEDURE REPORT  PATIENT: Annette, Hopkins  MR#: [id number] BIRTHDATE: [birth year] , 42  yrs. old GENDER: Female ENDOSCOPIST: Sable Feil, MD, [facility] REFERRED BY: PROCEDURE DATE:  10/30/2012 PROCEDURE:   Colonoscopy, surveillance ASA CLASS:   Class II INDICATIONS:High risk patient with previously diagnosed UC pancolitis 8+ years. MEDICATIONS: propofol (Diprivan) 366m IV  DESCRIPTION OF PROCEDURE:   After the risks and benefits and of the procedure were explained, informed consent was obtained.  A digital rectal exam revealed no abnormalities of the rectum.    The LB [id number]-[id number] endoscope was introduced through the anus and advanced to the cecum, which was identified by both the appendix and ileocecal valve .  The quality of the prep was excellent, using MoviPrep .  The instrument was then slowly withdrawn as the colon was fully examined.     COLON FINDINGS: A normal appearing cecum, ileocecal valve, and appendiceal orifice were identified.  The ascending, hepatic flexure, transverse, splenic flexure, descending, sigmoid colon and rectum appeared unremarkable.  No polyps or cancers were seen. Multiple random biopsies of the area were performed. Retroflexed views revealed no abnormalities.     The scope was then withdrawn from the patient and the procedure completed.Two sets of biopsies done...right and TV colon and left colon.  COMPLICATIONS: There were no complications. ENDOSCOPIC IMPRESSION: Normal colon; multiple random biopsies of the area were performed ...no active colitis...  RECOMMENDATIONS: 1.  Await pathology results 2.  Repeat Colonoscopy in 3 years. 3. Continue same meds  REPEAT EXAM:  cc:  _______________________________ eSigned:Sable Feil MD, FWilson N Jones [facility]03/31/2014 1:56 PM

## 2012-10-30 NOTE — Patient Instructions (Addendum)
YOU HAD AN ENDOSCOPIC PROCEDURE TODAY AT THE [facility]: Refer to the procedure report that was given to you for any specific questions about what was found during the examination.  If the procedure report does not answer your questions, please call your gastroenterologist to clarify.  If you requested that your care partner not be given the details of your procedure findings, then the procedure report has been included in a sealed envelope for you to review at your convenience later.  YOU SHOULD EXPECT: Some feelings of bloating in the abdomen. Passage of more gas than usual.  Walking can help get rid of the air that was put into your GI tract during the procedure and reduce the bloating. If you had a lower endoscopy (such as a colonoscopy or flexible sigmoidoscopy) you may notice spotting of blood in your stool or on the toilet paper. If you underwent a bowel prep for your procedure, then you may not have a normal bowel movement for a few days.  DIET: Your first meal following the procedure should be a light meal and then it is ok to progress to your normal diet.  A half-sandwich or bowl of soup is an example of a good first meal.  Heavy or fried foods are harder to digest and may make you feel nauseous or bloated.  Likewise meals heavy in dairy and vegetables can cause extra gas to form and this can also increase the bloating.  Drink plenty of fluids but you should avoid alcoholic beverages for 24 hours.  ACTIVITY: Your care partner should take you home directly after the procedure.  You should plan to take it easy, moving slowly for the rest of the day.  You can resume normal activity the day after the procedure however you should NOT DRIVE or use heavy machinery for 24 hours (because of the sedation medicines used during the test).    SYMPTOMS TO REPORT IMMEDIATELY: A gastroenterologist can be reached at any hour.  During normal business hours, 8:30 AM to 5:00 PM Monday through Friday,  call [phone].  After hours and on weekends, please call the GI answering service at [phone] who will take a message and have the physician on call contact you.   Following lower endoscopy (colonoscopy or flexible sigmoidoscopy):  Excessive amounts of blood in the stool  Significant tenderness or worsening of abdominal pains  Swelling of the abdomen that is new, acute  Fever of 100F or higher  Following upper endoscopy (EGD)  Vomiting of blood or coffee ground material  New chest pain or pain under the shoulder blades  Painful or persistently difficult swallowing  New shortness of breath  Fever of 100F or higher  Black, tarry-looking stools  FOLLOW UP: If any biopsies were taken you will be contacted by phone or by letter within the next 1-3 weeks.  Call your gastroenterologist if you have not heard about the biopsies in 3 weeks.  Our staff will call the home number listed on your records the next business day following your procedure to check on you and address any questions or concerns that you may have at that time regarding the information given to you following your procedure. This is a courtesy call and so if there is no answer at the home number and we have not heard from you through the emergency physician on call, we will assume that you have returned to your regular daily activities without incident.  SIGNATURES/CONFIDENTIALITY: You and/or your care   partner have signed paperwork which will be entered into your electronic medical record.  These signatures attest to the fact that that the information above on your After Visit Summary has been reviewed and is understood.  Full responsibility of the confidentiality of this discharge information lies with you and/or your care-partner.  

## 2012-10-30 NOTE — Progress Notes (Signed)
Patient did not experience any of the following events: a burn prior to discharge; a fall within the facility; wrong site/side/patient/procedure/implant event; or a hospital transfer or hospital admission upon discharge from the facility. (G8907) Patient did not have preoperative order for IV antibiotic SSI prophylaxis. (G8918)  

## 2012-10-31 ENCOUNTER — Telehealth: Payer: Self-pay | Admitting: *Deleted

## 2012-10-31 NOTE — Telephone Encounter (Signed)
Left message on number to return call if questions or problems. ewm

## 2012-11-03 ENCOUNTER — Encounter: Payer: Self-pay | Admitting: Gastroenterology

## 2012-11-09 ENCOUNTER — Ambulatory Visit (INDEPENDENT_AMBULATORY_CARE_PROVIDER_SITE_OTHER): Payer: BC Managed Care – PPO | Admitting: *Deleted

## 2012-11-09 VITALS — BP 106/70 | HR 72

## 2012-11-09 DIAGNOSIS — Z86718 Personal history of other venous thrombosis and embolism: Secondary | ICD-10-CM

## 2012-11-09 DIAGNOSIS — Z7901 Long term (current) use of anticoagulants: Secondary | ICD-10-CM

## 2012-11-09 NOTE — Patient Instructions (Signed)
Take 7.65m daily except 543mon Monday & Friday.  Return to the office in 3 weeks.

## 2012-11-13 ENCOUNTER — Telehealth: Payer: Self-pay | Admitting: Internal Medicine

## 2012-11-13 NOTE — Telephone Encounter (Signed)
Please call the patient  Note from GI reviewed, she's due for a colonoscopy , needs some Lovenox bridge. Plan: Stop coumadin 5 days prior to cscope  Restart coumadin the day after the procedure along w/ lovenox Send a prescription for Lovenox 60 mg one injection twice a day, #20, no refill. INR 5 days after the procedure.

## 2012-11-15 NOTE — Telephone Encounter (Signed)
lmovm for pt to return call.  

## 2012-11-15 NOTE — Telephone Encounter (Signed)
Spoke with pt, she had her colonoscopy done on 3.31.14.

## 2012-11-19 NOTE — Telephone Encounter (Signed)
Got it , THX

## 2012-11-30 ENCOUNTER — Ambulatory Visit (INDEPENDENT_AMBULATORY_CARE_PROVIDER_SITE_OTHER): Payer: BC Managed Care – PPO | Admitting: *Deleted

## 2012-11-30 VITALS — BP 90/68 | HR 76 | Wt 116.0 lb

## 2012-11-30 DIAGNOSIS — Z86718 Personal history of other venous thrombosis and embolism: Secondary | ICD-10-CM

## 2012-11-30 DIAGNOSIS — Z7901 Long term (current) use of anticoagulants: Secondary | ICD-10-CM

## 2012-11-30 LAB — POCT INR: INR: 2.8

## 2012-11-30 NOTE — Patient Instructions (Addendum)
Return to office in 4 weeks  New dosing; 5 mg today and tomorrow then resume 5 mg daily except 7.5 mg M,W,F

## 2012-12-21 ENCOUNTER — Other Ambulatory Visit: Payer: Self-pay | Admitting: Gastroenterology

## 2012-12-21 ENCOUNTER — Other Ambulatory Visit: Payer: Self-pay | Admitting: Internal Medicine

## 2012-12-22 NOTE — Telephone Encounter (Signed)
Refill done.  

## 2013-01-10 ENCOUNTER — Other Ambulatory Visit: Payer: Self-pay | Admitting: *Deleted

## 2013-01-10 MED ORDER — WARFARIN SODIUM 5 MG PO TABS: ORAL_TABLET | ORAL | Status: DC

## 2013-01-10 NOTE — Telephone Encounter (Signed)
Rx sent 

## 2013-01-13 ENCOUNTER — Telehealth: Payer: Self-pay | Admitting: Internal Medicine

## 2013-01-13 NOTE — Telephone Encounter (Signed)
Due for  INR, please arrange

## 2013-01-15 NOTE — Telephone Encounter (Signed)
thx

## 2013-01-15 NOTE — Telephone Encounter (Signed)
Pt scheduled for 01/17/13.

## 2013-01-17 ENCOUNTER — Encounter: Payer: Self-pay | Admitting: Internal Medicine

## 2013-01-17 ENCOUNTER — Ambulatory Visit (INDEPENDENT_AMBULATORY_CARE_PROVIDER_SITE_OTHER): Payer: BC Managed Care – PPO | Admitting: *Deleted

## 2013-01-17 VITALS — BP 98/62 | HR 86 | Wt 117.0 lb

## 2013-01-17 DIAGNOSIS — Z86718 Personal history of other venous thrombosis and embolism: Secondary | ICD-10-CM

## 2013-01-17 DIAGNOSIS — Z7901 Long term (current) use of anticoagulants: Secondary | ICD-10-CM

## 2013-01-17 LAB — POCT INR: INR: 2.2

## 2013-01-17 NOTE — Patient Instructions (Signed)
Return to office in 4 weeks  Continue current dose: 7.5 mg daily, 5 mg M,W,F

## 2013-02-20 ENCOUNTER — Ambulatory Visit (INDEPENDENT_AMBULATORY_CARE_PROVIDER_SITE_OTHER): Payer: BC Managed Care – PPO | Admitting: Internal Medicine

## 2013-02-20 ENCOUNTER — Encounter: Payer: Self-pay | Admitting: Internal Medicine

## 2013-02-20 ENCOUNTER — Ambulatory Visit: Payer: BC Managed Care – PPO

## 2013-02-20 VITALS — BP 90/55 | HR 83 | Temp 98.5°F | Ht 64.0 in | Wt 118.2 lb

## 2013-02-20 DIAGNOSIS — M8580 Other specified disorders of bone density and structure, unspecified site: Secondary | ICD-10-CM

## 2013-02-20 DIAGNOSIS — Z7901 Long term (current) use of anticoagulants: Secondary | ICD-10-CM

## 2013-02-20 DIAGNOSIS — D689 Coagulation defect, unspecified: Secondary | ICD-10-CM

## 2013-02-20 DIAGNOSIS — M069 Rheumatoid arthritis, unspecified: Secondary | ICD-10-CM

## 2013-02-20 DIAGNOSIS — Z0000 Encounter for general adult medical examination without abnormal findings: Secondary | ICD-10-CM

## 2013-02-20 DIAGNOSIS — C439 Malignant melanoma of skin, unspecified: Secondary | ICD-10-CM

## 2013-02-20 NOTE — Assessment & Plan Note (Signed)
Not an issue at this point, does not see a rheumatologist

## 2013-02-20 NOTE — Assessment & Plan Note (Signed)
Tdap today 2013 cscope 09-2012 , dr Sharlett Iles, normal, random bx:IBS  Female care per gynecologist  Labs

## 2013-02-20 NOTE — Progress Notes (Signed)
  Subjective:    Patient ID: Annette Hopkins, female    DOB: 03-May-1971, 42 y.o.   MRN: [id number]  HPI CPX  Past Medical History  Diagnosis Date  . Ulcerative colitis     dx  around 2004  . History of blood clots      B leg DVT 2005, abdomen. On Coumadin for life  . RA (rheumatoid arthritis)     ~2005  . Long term (current) use of anticoagulants   . Abnormal LFTs   . Esophagitis    Past Surgical History  Procedure Laterality Date  . 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 No dysuria or gross hematuria. No anxiety or depression. Occasionally has cramping in her toes, usually related to stay not her feet all day at work.     Objective:   Physical Exam BP 90/55  Pulse 83  Temp(Src) 98.5 F (36.9 C) (Oral)  Ht 5' 4"  (1.626 m)  Wt 118 lb 3.2 oz (53.615 kg)  BMI 20.28 kg/m2  SpO2 98% General -- alert, well-developed, NAD .   Neck --no thyromegaly , normal carotid pulse  Lungs -- normal respiratory effort, no intercostal retractions, no accessory muscle use, and normal breath sounds.   Heart-- normal rate, regular rhythm, no murmur, and no gallop.   Abdomen--soft, non-tender, no distention, no masses   Extremities-- no pretibial edema bilaterally, nl pedal pulses and cap refills   Neurologic-- alert & oriented X3 and strength normal in all extremities. Psych-- Cognition and judgment appear intact. Alert and cooperative with normal attention span and concentration.  not anxious appearing and not depressed appearing.        Assessment & Plan:

## 2013-02-20 NOTE — Assessment & Plan Note (Addendum)
inr today 1.5, was on vacation, diet changed, forgot some doses. We also got a error message thus will get a venous stick. Current dose : 5 mg daily except 7.5 mg on Monday, Wednesday & Friday Further advise w/ results

## 2013-02-20 NOTE — Patient Instructions (Addendum)
INR today --- Come back fasting in 2  Week  CMP, CBC, TSH, FLP---- dx v70 --- Calcium 1 gr a day, vit D 600 and 1000 units a day --- Next visit with me in 1 year

## 2013-02-20 NOTE — Assessment & Plan Note (Signed)
Melanoma 11-2012, L leg, Status post excision, good prognosis per pt

## 2013-02-20 NOTE — Assessment & Plan Note (Addendum)
Bone density test 01-2012, disc or -2.0. No personal history of fractures. Grandmother had osteoporosis onset in her 22s or 63s. Currently not on calcium and vitamin D, she swims Plan: Calcium and vitamin D daily weightbearing exercise Discussed Fosamax some additional option, she we'll think about it.

## 2013-03-06 ENCOUNTER — Other Ambulatory Visit: Payer: BC Managed Care – PPO

## 2013-03-07 ENCOUNTER — Other Ambulatory Visit (INDEPENDENT_AMBULATORY_CARE_PROVIDER_SITE_OTHER): Payer: BC Managed Care – PPO

## 2013-03-07 DIAGNOSIS — Z0000 Encounter for general adult medical examination without abnormal findings: Secondary | ICD-10-CM

## 2013-03-07 LAB — CBC WITH DIFFERENTIAL/PLATELET
Eosinophils Absolute: 0.1 10*3/uL (ref 0.0–0.7)
MCHC: 33.9 g/dL (ref 30.0–36.0)
MCV: 89.2 fl (ref 78.0–100.0)
Monocytes Absolute: 0.5 10*3/uL (ref 0.1–1.0)
Neutrophils Relative %: 69.4 % (ref 43.0–77.0)
Platelets: 159 10*3/uL (ref 150.0–400.0)
RDW: 12.8 % (ref 11.5–14.6)

## 2013-03-07 LAB — COMPREHENSIVE METABOLIC PANEL
AST: 24 U/L (ref 0–37)
Albumin: 4.1 g/dL (ref 3.5–5.2)
Alkaline Phosphatase: 65 U/L (ref 39–117)
Potassium: 4.5 mEq/L (ref 3.5–5.1)
Sodium: 137 mEq/L (ref 135–145)
Total Protein: 6.9 g/dL (ref 6.0–8.3)

## 2013-03-07 LAB — TSH: TSH: 0.96 u[IU]/mL (ref 0.35–5.50)

## 2013-03-07 LAB — LIPID PANEL
Cholesterol: 148 mg/dL (ref 0–200)
LDL Cholesterol: 78 mg/dL (ref 0–99)

## 2013-03-09 ENCOUNTER — Telehealth: Payer: Self-pay | Admitting: *Deleted

## 2013-03-09 NOTE — Telephone Encounter (Signed)
Spoke with lab, unable to add. Would you like pt. To schedule lab appointment or nurse visit for INR check. Thanks.

## 2013-03-09 NOTE — Telephone Encounter (Signed)
Message copied by Obie Dredge I on Fri Mar 09, 2013 11:01 AM ------      Message from: Kathlene November E      Created: Thu Mar 08, 2013  4:40 PM       She needed an INR w/ labs, could we add?? ------

## 2013-03-09 NOTE — Telephone Encounter (Signed)
Yes, today or Monday (we can do a POC, no charge for visit )

## 2013-03-09 NOTE — Telephone Encounter (Signed)
Discussed with patient, states she will stop by this evening. Triage RN made aware.

## 2013-03-09 NOTE — Telephone Encounter (Signed)
lmovm to return call to office.

## 2013-03-16 ENCOUNTER — Ambulatory Visit (INDEPENDENT_AMBULATORY_CARE_PROVIDER_SITE_OTHER): Payer: BC Managed Care – PPO

## 2013-03-16 VITALS — BP 118/78 | HR 86 | Temp 98.3°F | Wt 119.2 lb

## 2013-03-16 DIAGNOSIS — D689 Coagulation defect, unspecified: Secondary | ICD-10-CM

## 2013-03-16 NOTE — Progress Notes (Signed)
Patient ID: Annette Hopkins, female   DOB: [birth year], 42 y.o.   MRN: [id number] INR today is 1.9 Coumadin 5 mg MWF, 7.5 other days (45/w) New coumadin: Coumadin 7.5 every day except Tuesday and Friday take 5 mg (47.5)  Next 3 week JP

## 2013-03-16 NOTE — Patient Instructions (Addendum)
Take 7.8m everyday except Tues and Fri take 5 mg. Follow up in 3 weeks. Enjoy the rest of your summer!

## 2013-03-18 ENCOUNTER — Other Ambulatory Visit: Payer: Self-pay | Admitting: Internal Medicine

## 2013-03-19 NOTE — Telephone Encounter (Signed)
Change sig to " as directed", #45, no refills

## 2013-03-19 NOTE — Telephone Encounter (Signed)
Please advise on sig. Pt. Currently taking 7.5 mg daily except 5 mg on Tuesday and Friday as of coag-visit on 03/1513. Current sig states 5 mg daily.  Thanks.

## 2013-03-19 NOTE — Telephone Encounter (Signed)
Refill done per orders.

## 2013-04-10 ENCOUNTER — Ambulatory Visit: Payer: BC Managed Care – PPO

## 2013-04-19 ENCOUNTER — Ambulatory Visit (INDEPENDENT_AMBULATORY_CARE_PROVIDER_SITE_OTHER): Payer: BC Managed Care – PPO | Admitting: *Deleted

## 2013-04-19 VITALS — BP 106/60 | HR 70 | Temp 98.1°F | Wt 119.0 lb

## 2013-04-19 DIAGNOSIS — D689 Coagulation defect, unspecified: Secondary | ICD-10-CM

## 2013-04-19 DIAGNOSIS — Z7901 Long term (current) use of anticoagulants: Secondary | ICD-10-CM

## 2013-04-19 NOTE — Patient Instructions (Signed)
Continue current dose and return in 4 weeks for INR check

## 2013-05-01 ENCOUNTER — Other Ambulatory Visit: Payer: Self-pay | Admitting: Internal Medicine

## 2013-05-01 ENCOUNTER — Other Ambulatory Visit: Payer: Self-pay | Admitting: Gastroenterology

## 2013-05-01 NOTE — Telephone Encounter (Signed)
rx refilled per protocol. DJR

## 2013-05-21 ENCOUNTER — Ambulatory Visit: Payer: BC Managed Care – PPO

## 2013-05-23 ENCOUNTER — Ambulatory Visit (INDEPENDENT_AMBULATORY_CARE_PROVIDER_SITE_OTHER): Payer: BC Managed Care – PPO | Admitting: *Deleted

## 2013-05-23 DIAGNOSIS — Z7901 Long term (current) use of anticoagulants: Secondary | ICD-10-CM

## 2013-05-23 LAB — POCT INR: INR: 2

## 2013-05-23 NOTE — Patient Instructions (Signed)
Per Dr Larose Kells pt to continue same dose. Recheck in 4wks.

## 2013-06-07 ENCOUNTER — Other Ambulatory Visit: Payer: Self-pay

## 2013-06-18 ENCOUNTER — Other Ambulatory Visit: Payer: Self-pay | Admitting: Internal Medicine

## 2013-06-18 ENCOUNTER — Other Ambulatory Visit: Payer: Self-pay | Admitting: Gastroenterology

## 2013-06-19 ENCOUNTER — Ambulatory Visit (INDEPENDENT_AMBULATORY_CARE_PROVIDER_SITE_OTHER): Payer: BC Managed Care – PPO | Admitting: *Deleted

## 2013-06-19 VITALS — BP 118/72 | HR 75 | Temp 97.7°F | Wt 118.0 lb

## 2013-06-19 DIAGNOSIS — D689 Coagulation defect, unspecified: Secondary | ICD-10-CM

## 2013-06-19 DIAGNOSIS — Z7901 Long term (current) use of anticoagulants: Secondary | ICD-10-CM

## 2013-06-19 LAB — POCT INR: INR: 2.3

## 2013-06-19 NOTE — Patient Instructions (Addendum)
7.19m all days except Tues and Fri take 516m Recheck in 4 weeks

## 2013-06-19 NOTE — Telephone Encounter (Signed)
Warfarin refilled per protocol

## 2013-07-18 ENCOUNTER — Ambulatory Visit: Payer: BC Managed Care – PPO

## 2013-07-22 ENCOUNTER — Telehealth: Payer: Self-pay | Admitting: Internal Medicine

## 2013-07-22 NOTE — Telephone Encounter (Signed)
Due for INR, please arrange

## 2013-07-23 NOTE — Telephone Encounter (Signed)
Patient scheduled for recheck on INR

## 2013-07-29 ENCOUNTER — Other Ambulatory Visit: Payer: Self-pay | Admitting: Gastroenterology

## 2013-07-31 ENCOUNTER — Ambulatory Visit (INDEPENDENT_AMBULATORY_CARE_PROVIDER_SITE_OTHER): Payer: Self-pay | Admitting: *Deleted

## 2013-07-31 VITALS — BP 120/76 | HR 88 | Temp 98.2°F | Wt 116.6 lb

## 2013-07-31 DIAGNOSIS — D689 Coagulation defect, unspecified: Secondary | ICD-10-CM

## 2013-07-31 DIAGNOSIS — Z7901 Long term (current) use of anticoagulants: Secondary | ICD-10-CM

## 2013-07-31 NOTE — Patient Instructions (Signed)
7.68m all days except Tues and Fri take 533m Return 4 weks

## 2013-08-28 ENCOUNTER — Ambulatory Visit: Payer: BC Managed Care – PPO

## 2013-09-04 ENCOUNTER — Ambulatory Visit: Payer: BC Managed Care – PPO

## 2013-09-13 ENCOUNTER — Ambulatory Visit (INDEPENDENT_AMBULATORY_CARE_PROVIDER_SITE_OTHER): Payer: BC Managed Care – PPO

## 2013-09-13 ENCOUNTER — Telehealth: Payer: Self-pay | Admitting: Internal Medicine

## 2013-09-13 VITALS — BP 94/65 | HR 82 | Temp 97.7°F | Wt 122.8 lb

## 2013-09-13 DIAGNOSIS — D689 Coagulation defect, unspecified: Secondary | ICD-10-CM

## 2013-09-13 DIAGNOSIS — Z7901 Long term (current) use of anticoagulants: Secondary | ICD-10-CM

## 2013-09-13 LAB — POCT INR: INR: 1.8

## 2013-09-13 NOTE — Patient Instructions (Signed)
Continue same dosing. Tues and Friday take 5 mg all other days take 7.74m  Dr PLarose Kellswill review and we will call with any changes.

## 2013-09-13 NOTE — Telephone Encounter (Signed)
Pt scheduled today

## 2013-09-13 NOTE — Telephone Encounter (Signed)
Call patient, schedule a visit for an INR check

## 2013-09-14 ENCOUNTER — Telehealth: Payer: Self-pay

## 2013-09-14 NOTE — Telephone Encounter (Signed)
Left message for call back identifiable Need to review coumadin dosing.

## 2013-09-17 NOTE — Telephone Encounter (Signed)
Spoke with patient and gave new instructions for Coumadin. 7.64m all days except Friday 551m per Dr PaLarose KellsRecheck in 3 weeks Patient verbalizes understanding.

## 2013-09-18 ENCOUNTER — Other Ambulatory Visit: Payer: Self-pay | Admitting: Gastroenterology

## 2013-09-18 ENCOUNTER — Other Ambulatory Visit: Payer: Self-pay | Admitting: Internal Medicine

## 2013-10-11 ENCOUNTER — Ambulatory Visit (INDEPENDENT_AMBULATORY_CARE_PROVIDER_SITE_OTHER): Payer: BC Managed Care – PPO

## 2013-10-11 VITALS — BP 95/63 | HR 59 | Temp 98.3°F | Wt 120.6 lb

## 2013-10-11 DIAGNOSIS — Z86718 Personal history of other venous thrombosis and embolism: Secondary | ICD-10-CM

## 2013-10-11 DIAGNOSIS — Z7901 Long term (current) use of anticoagulants: Secondary | ICD-10-CM

## 2013-10-11 LAB — POCT INR: INR: 4.3

## 2013-10-11 NOTE — Progress Notes (Signed)
Pre visit review using our clinic review tool, if applicable. No additional management support is needed unless otherwise documented below in the visit note. 

## 2013-10-11 NOTE — Progress Notes (Signed)
INR today 4.3. Current Coumadin: 5 mg daily, Fridays 7.5 mg. (37.5 mg) Plan: Hold Coumadin for 2 days. New dose of Coumadin: 5 mg daily, 2.5 mg Tuesday and Friday (30 mg) INR in 3 weeks JP

## 2013-10-11 NOTE — Patient Instructions (Signed)
Hold coumadin for 2 days. Resume with 24m all days except Tuesday and Friday 2.553mRecheck in 3 weeks.  Appt: Thursday 11/01/2013 at 330pm

## 2013-10-31 ENCOUNTER — Telehealth: Payer: Self-pay | Admitting: Gastroenterology

## 2013-10-31 NOTE — Telephone Encounter (Signed)
Spoke with patient and she states she has had abdominal discomfort for several months. For the last week, it has increased. Now it it keeping her up at night. She prefers Dr. Hilarie Fredrickson. Scheduled with Nicoletta Ba, PA on 11/01/13 at 2:00 PM.

## 2013-11-01 ENCOUNTER — Ambulatory Visit (INDEPENDENT_AMBULATORY_CARE_PROVIDER_SITE_OTHER): Payer: BC Managed Care – PPO | Admitting: Physician Assistant

## 2013-11-01 ENCOUNTER — Ambulatory Visit: Payer: BC Managed Care – PPO

## 2013-11-01 ENCOUNTER — Other Ambulatory Visit (INDEPENDENT_AMBULATORY_CARE_PROVIDER_SITE_OTHER): Payer: BC Managed Care – PPO

## 2013-11-01 ENCOUNTER — Encounter: Payer: Self-pay | Admitting: Physician Assistant

## 2013-11-01 VITALS — BP 100/72 | HR 80 | Ht 64.0 in | Wt 116.6 lb

## 2013-11-01 DIAGNOSIS — R1033 Periumbilical pain: Secondary | ICD-10-CM

## 2013-11-01 DIAGNOSIS — D6859 Other primary thrombophilia: Secondary | ICD-10-CM

## 2013-11-01 DIAGNOSIS — K5190 Ulcerative colitis, unspecified, without complications: Secondary | ICD-10-CM

## 2013-11-01 LAB — CBC WITH DIFFERENTIAL/PLATELET
BASOS PCT: 0.3 % (ref 0.0–3.0)
Basophils Absolute: 0 10*3/uL (ref 0.0–0.1)
EOS PCT: 3.2 % (ref 0.0–5.0)
Eosinophils Absolute: 0.2 10*3/uL (ref 0.0–0.7)
HCT: 43.4 % (ref 36.0–46.0)
Hemoglobin: 14.7 g/dL (ref 12.0–15.0)
Lymphocytes Relative: 29.1 % (ref 12.0–46.0)
Lymphs Abs: 2.1 10*3/uL (ref 0.7–4.0)
MCHC: 33.9 g/dL (ref 30.0–36.0)
MCV: 89.8 fl (ref 78.0–100.0)
MONO ABS: 0.5 10*3/uL (ref 0.1–1.0)
MONOS PCT: 7.3 % (ref 3.0–12.0)
NEUTROS PCT: 60.1 % (ref 43.0–77.0)
Neutro Abs: 4.3 10*3/uL (ref 1.4–7.7)
Platelets: 203 10*3/uL (ref 150.0–400.0)
RBC: 4.83 Mil/uL (ref 3.87–5.11)
RDW: 12.8 % (ref 11.5–14.6)
WBC: 7.1 10*3/uL (ref 4.5–10.5)

## 2013-11-01 LAB — URINALYSIS, ROUTINE W REFLEX MICROSCOPIC
Bilirubin Urine: NEGATIVE
Ketones, ur: NEGATIVE
Nitrite: NEGATIVE
PH: 6 (ref 5.0–8.0)
Specific Gravity, Urine: 1.01 (ref 1.000–1.030)
TOTAL PROTEIN, URINE-UPE24: NEGATIVE
URINE GLUCOSE: NEGATIVE
Urobilinogen, UA: 0.2 (ref 0.0–1.0)

## 2013-11-01 LAB — COMPREHENSIVE METABOLIC PANEL
ALBUMIN: 4.5 g/dL (ref 3.5–5.2)
ALT: 18 U/L (ref 0–35)
AST: 22 U/L (ref 0–37)
Alkaline Phosphatase: 54 U/L (ref 39–117)
BILIRUBIN TOTAL: 0.6 mg/dL (ref 0.3–1.2)
BUN: 12 mg/dL (ref 6–23)
CALCIUM: 9.3 mg/dL (ref 8.4–10.5)
CHLORIDE: 99 meq/L (ref 96–112)
CO2: 29 meq/L (ref 19–32)
Creatinine, Ser: 0.7 mg/dL (ref 0.4–1.2)
GFR: 99.59 mL/min (ref >60.00)
GLUCOSE: 94 mg/dL (ref 70–99)
POTASSIUM: 3.8 meq/L (ref 3.5–5.1)
SODIUM: 137 meq/L (ref 135–145)
Total Protein: 7.7 g/dL (ref 6.0–8.3)

## 2013-11-01 LAB — C-REACTIVE PROTEIN: CRP: 0.8 mg/dL (ref 0.5–20.0)

## 2013-11-01 MED ORDER — TRAMADOL HCL 50 MG PO TABS: ORAL_TABLET | ORAL | Status: DC

## 2013-11-01 NOTE — Patient Instructions (Signed)
Your physician has requested that you go to the basement for the following lab work before leaving today: CBC  CMET CRP Urinalysis  We have sent the following medications to your pharmacy for you to pick up at your convenience: Tramadol 50 mg, please take one tablet by mouth every 6-8 hours as needed for pain    You have been scheduled for a CT scan of the abdomen and pelvis at [facility] ([address]---this is in the same building as Press [facility]).   You are scheduled on 11-02-13 at 12 pm. You should arrive 15 minutes prior to your appointment time for registration. Please follow the written instructions below on the day of your exam:  WARNING: IF YOU ARE ALLERGIC TO IODINE/X-RAY DYE, PLEASE NOTIFY RADIOLOGY IMMEDIATELY AT [phone]! YOU WILL BE GIVEN A 13 HOUR PREMEDICATION PREP.  1) Do not eat or drink anything after 8 am (4 hours prior to your test) 2) You have been given 2 bottles of oral contrast to drink. The solution may taste better if refrigerated, but do NOT add ice or any other liquid to this solution. Shake well before drinking.    Drink 1 bottle of contrast @ 10 am (2 hours prior to your exam)  Drink 1 bottle of contrast @ 11 am (1 hour prior to your exam)  You may take any medications as prescribed with a small amount of water except for the following: Metformin, Glucophage, Glucovance, Avandamet, Riomet, Fortamet, Actoplus Met, Janumet, Glumetza or Metaglip. The above medications must be held the day of the exam AND 48 hours after the exam.  The purpose of you drinking the oral contrast is to aid in the visualization of your intestinal tract. The contrast solution may cause some diarrhea. Before your exam is started, you will be given a small amount of fluid to drink. Depending on your individual set of symptoms, you may also receive an intravenous injection of x-ray contrast/dye. Plan on being at [facility] for 30 minutes or long, depending on  the type of exam you are having performed.  This test typically takes 30-45 minutes to complete.  If you have any questions regarding your exam or if you need to reschedule, you may call the CT department at [phone] between the hours of 8:00 am and 5:00 pm, Monday-Friday.  ________________________________________________________________________

## 2013-11-01 NOTE — Progress Notes (Addendum)
Subjective:    Patient ID: Annette Hopkins, female    DOB: 08-07-1970, 43 y.o.   MRN: [id number]  HPI  Annette Hopkins is a very nice 43 year old white female previous patient of Dr. Buel Ream with history of universal ulcerative colitis. She had last undergone colonoscopy for surveillance in March of 2014 and this was a normal exam with no evidence for active colitis. However on biopsies there was chronic active colitis in the right and left colon, but no dysplasia, and she was recommended for followup in 3 years. She is maintained on Lialda  2.4 g daily . Patient also has history of a hypercoagulable state and is maintained on chronic Coumadin. She has had previous DVTs. She comes in today stating that she's had about 2 months of intermittent mid left-sided abdominal cramping which has been somewhat progressive over the past couple of weeks. She says the pain is now constant and low-grade. She has not had any change in her bowel movements no diarrhea no melena or hematochezia. She does not feel that the pain is aggravated by eating necessarily or changed by bowel movements. No dysuria urgency or frequency. No fever or chills. No nausea or vomiting. She says is still different than her colitis generally feels. He says over the last 3 nights she spent difficulty sleeping on her left side do to discomfort.    Review of Systems  Constitutional: Negative.   HENT: Negative.   Eyes: Negative.   Respiratory: Negative.   Cardiovascular: Negative.   Gastrointestinal: Positive for abdominal pain.  Endocrine: Negative.   Genitourinary: Negative.   Musculoskeletal: Negative.   Skin: Negative.   Allergic/Immunologic: Negative.   Neurological: Negative.   Hematological: Negative.   Psychiatric/Behavioral: Negative.    Outpatient Prescriptions Prior to Visit  Medication Sig Dispense Refill  . levonorgestrel (MIRENA) 20 MCG/24HR IUD 1 each by Intrauterine route once.      Marland Kitchen LIALDA 1.2 G EC tablet TAKE 2 TABLETS  BY MOUTH EVERY DAY  60 tablet  0  . Multiple Vitamin (MULTIVITAMIN) capsule Take 1 capsule by mouth daily.      Marland Kitchen warfarin (COUMADIN) 5 MG tablet TAKE AS DIRECTED.  45 tablet  0   No facility-administered medications prior to visit.   Allergies  Allergen Reactions  . Mercaptopurine Nausea And Vomiting  . Sulfa Antibiotics    Patient Active Problem List   Diagnosis Date Noted  . Melanoma 02/20/2013  . Headache 04/20/2012  . Osteopenia 02/21/2012  . GERD (gastroesophageal reflux disease) 02/17/2011  . UC (ulcerative colitis) 02/16/2011  . Coagulation defect 02/16/2011  . Abnormal liver function tests 02/16/2011  . Transaminitis 11/11/2010  . Annual physical exam 11/03/2010  . Ulcerative colitis   . History of blood clots   . RA (rheumatoid arthritis)       History  Substance Use Topics  . Smoking status: Never Smoker   . Smokeless tobacco: Never Used  . Alcohol Use: Yes   family history includes Breast cancer in her mother; Colon cancer in her paternal grandmother; Colon polyps in her paternal grandmother.  Objective:   Physical Exam  WD WF in NAD ,   Pleasant blood pressure 100/72 pulse 80 height 5 foot 4 weight 116. HEENT; nontraumatic normocephalic EOMI PERRLA sclera anicteric, Supple; no JVD, Cardiovascular; regular rate and rhythm with S1-S2 no murmur or gallop, Pulmonary clear bilaterally, Abdomen; soft nondistended bowel sounds are active she does have some mild tenderness in the left mid quadrant there is no guarding or  rebound no palpable mass or hepatosplenomegaly, Rectal; exam not done, Extremities; no clubbing cyanosis or edema skin warm and dry, Psych; mood and affect normal and appropriate        Assessment & Plan:  #74  43 year old female with history of universal ulcerative colitis, quiescent at last colonoscopy March 2014 but path showed a chronic active colitis in the right and left colon. Patient now with one month history of left mid abdominal pain  gradually progressive with no other symptoms to suggest exacerbation of ulcerative colitis. Etiology is not clear. #2 history of hypercoagulable disorder maintained on Coumadin #3 rheumatoid arthritis #4 GERD  Plan; CBC with differential, CMET, CRP and UA Schedule for CT scan of the abdomen and pelvis with contrast Continue Lialda  2.4 g daily for now Add Ultram 50 mg every 6 hours as needed for pain #30 no refills Patient will be established with Dr. Hilarie Fredrickson for continuing GI care.  Addendum: Reviewed and agree with initial management. Jerene Bears, MD

## 2013-11-02 ENCOUNTER — Ambulatory Visit (INDEPENDENT_AMBULATORY_CARE_PROVIDER_SITE_OTHER)
Admission: RE | Admit: 2013-11-02 | Discharge: 2013-11-02 | Disposition: A | Discharge status: Home / Self Care | Payer: BC Managed Care – PPO | Source: Ambulatory Visit | Attending: Physician Assistant | Admitting: Physician Assistant

## 2013-11-02 DIAGNOSIS — R1033 Periumbilical pain: Secondary | ICD-10-CM

## 2013-11-02 DIAGNOSIS — D6859 Other primary thrombophilia: Secondary | ICD-10-CM

## 2013-11-02 DIAGNOSIS — K5190 Ulcerative colitis, unspecified, without complications: Secondary | ICD-10-CM

## 2013-11-02 MED ORDER — IOHEXOL 300 MG/ML  SOLN
100.0000 mL | Freq: Once | INTRAMUSCULAR | Status: AC | PRN
  Administered 2013-11-02: 100 mL via INTRAVENOUS

## 2013-11-10 ENCOUNTER — Other Ambulatory Visit: Payer: Self-pay | Admitting: Internal Medicine

## 2013-11-10 ENCOUNTER — Other Ambulatory Visit: Payer: Self-pay | Admitting: Gastroenterology

## 2013-11-12 NOTE — Telephone Encounter (Signed)
Patient is requesting refill on Lialda. Patient had an office visit with you on 11-01-13. Is it ok to refill?

## 2013-11-12 NOTE — Telephone Encounter (Signed)
Ok to refill x8

## 2013-11-18 ENCOUNTER — Telehealth: Payer: Self-pay | Admitting: Internal Medicine

## 2013-11-18 NOTE — Telephone Encounter (Signed)
Patient is overdue for an INR----------> please arrange

## 2013-11-19 NOTE — Telephone Encounter (Signed)
lmovm

## 2013-11-26 ENCOUNTER — Ambulatory Visit (INDEPENDENT_AMBULATORY_CARE_PROVIDER_SITE_OTHER): Payer: BC Managed Care – PPO

## 2013-11-26 VITALS — BP 95/67 | HR 84 | Temp 97.5°F | Wt 119.0 lb

## 2013-11-26 DIAGNOSIS — Z86718 Personal history of other venous thrombosis and embolism: Secondary | ICD-10-CM

## 2013-11-26 DIAGNOSIS — Z7901 Long term (current) use of anticoagulants: Secondary | ICD-10-CM

## 2013-11-26 LAB — POCT INR: INR: 2.1

## 2013-11-26 NOTE — Progress Notes (Signed)
Pre visit review using our clinic review tool, if applicable. No additional management support is needed unless otherwise documented below in the visit note. 

## 2013-11-26 NOTE — Patient Instructions (Signed)
Take 56m all days except Tues and Friday take 7.519mRecheck in 4 weeks. Tues 12/27/2013 at 3:45pm

## 2013-11-27 ENCOUNTER — Ambulatory Visit: Payer: BC Managed Care – PPO

## 2013-12-27 ENCOUNTER — Ambulatory Visit (INDEPENDENT_AMBULATORY_CARE_PROVIDER_SITE_OTHER): Payer: BC Managed Care – PPO | Admitting: *Deleted

## 2013-12-27 VITALS — BP 95/61 | HR 70 | Temp 97.8°F | Wt 117.0 lb

## 2013-12-27 DIAGNOSIS — D689 Coagulation defect, unspecified: Secondary | ICD-10-CM

## 2013-12-27 DIAGNOSIS — Z7901 Long term (current) use of anticoagulants: Secondary | ICD-10-CM

## 2013-12-27 LAB — POCT INR
INR: 2.4
INR: 2.4

## 2013-12-27 NOTE — Patient Instructions (Signed)
7.59m all days except Tues and Fri take 569m  Return 4 weeks

## 2014-01-01 ENCOUNTER — Other Ambulatory Visit: Payer: Self-pay | Admitting: Internal Medicine

## 2014-01-24 ENCOUNTER — Ambulatory Visit: Payer: BC Managed Care – PPO

## 2014-02-03 ENCOUNTER — Telehealth: Payer: Self-pay | Admitting: Internal Medicine

## 2014-02-03 NOTE — Telephone Encounter (Signed)
Due for an INR, please arrange

## 2014-02-04 NOTE — Telephone Encounter (Signed)
scheduled 02/05/14.     KP

## 2014-02-05 ENCOUNTER — Ambulatory Visit (INDEPENDENT_AMBULATORY_CARE_PROVIDER_SITE_OTHER): Payer: BC Managed Care – PPO | Admitting: *Deleted

## 2014-02-05 VITALS — BP 104/58 | HR 79 | Temp 98.0°F | Wt 116.0 lb

## 2014-02-05 DIAGNOSIS — Z7901 Long term (current) use of anticoagulants: Secondary | ICD-10-CM

## 2014-02-05 DIAGNOSIS — D689 Coagulation defect, unspecified: Secondary | ICD-10-CM

## 2014-02-05 DIAGNOSIS — Z86718 Personal history of other venous thrombosis and embolism: Secondary | ICD-10-CM

## 2014-02-05 LAB — POCT INR
INR: 1.7
INR: 1.7

## 2014-02-05 NOTE — Patient Instructions (Signed)
7.32m all days  Return 3 weeks

## 2014-02-11 ENCOUNTER — Other Ambulatory Visit: Payer: Self-pay | Admitting: Internal Medicine

## 2014-02-26 ENCOUNTER — Ambulatory Visit: Payer: BC Managed Care – PPO

## 2014-03-04 ENCOUNTER — Telehealth: Payer: Self-pay | Admitting: Internal Medicine

## 2014-03-04 NOTE — Telephone Encounter (Signed)
Due for INR, please arrange

## 2014-03-04 NOTE — Telephone Encounter (Signed)
LMOVM to return call

## 2014-03-05 NOTE — Telephone Encounter (Signed)
Pt scheduled  

## 2014-03-11 ENCOUNTER — Ambulatory Visit (INDEPENDENT_AMBULATORY_CARE_PROVIDER_SITE_OTHER): Payer: BC Managed Care – PPO

## 2014-03-11 VITALS — BP 96/58 | HR 76 | Temp 98.2°F | Wt 117.2 lb

## 2014-03-11 DIAGNOSIS — Z7901 Long term (current) use of anticoagulants: Secondary | ICD-10-CM

## 2014-03-11 DIAGNOSIS — Z86718 Personal history of other venous thrombosis and embolism: Secondary | ICD-10-CM

## 2014-03-11 LAB — POCT INR: INR: 2.6

## 2014-03-11 NOTE — Progress Notes (Signed)
Pre visit review using our clinic review tool, if applicable. No additional management support is needed unless otherwise documented below in the visit note. 

## 2014-03-11 NOTE — Patient Instructions (Signed)
Continue taking  7.5 mg Coumadin every day except Tuesday and Friday take 5 mg of Coumadin.  Return to clinic in 4 weeks.

## 2014-03-13 ENCOUNTER — Encounter: Payer: Self-pay | Admitting: Internal Medicine

## 2014-03-13 ENCOUNTER — Ambulatory Visit (INDEPENDENT_AMBULATORY_CARE_PROVIDER_SITE_OTHER): Payer: BC Managed Care – PPO | Admitting: Internal Medicine

## 2014-03-13 VITALS — BP 92/60 | HR 71 | Temp 98.2°F | Ht 66.0 in | Wt 118.0 lb

## 2014-03-13 DIAGNOSIS — Z0000 Encounter for general adult medical examination without abnormal findings: Secondary | ICD-10-CM

## 2014-03-13 DIAGNOSIS — Z86718 Personal history of other venous thrombosis and embolism: Secondary | ICD-10-CM

## 2014-03-13 LAB — LIPID PANEL
CHOL/HDL RATIO: 3
CHOLESTEROL: 180 mg/dL (ref 0–200)
HDL: 63.8 mg/dL (ref >39.00)
LDL CALC: 91 mg/dL (ref 0–99)
NONHDL: 116.2
Triglycerides: 125 mg/dL (ref 0.0–149.0)
VLDL: 25 mg/dL (ref 0.0–40.0)

## 2014-03-13 NOTE — Assessment & Plan Note (Addendum)
Saw hematology in 2012, at the time they recommend continue with Coumadin. Is very taxing for the patient to come back monthly for Coumadin checks. Plan: Refer back to hematology, okay to change from Coumadin to oral anticoagulants?

## 2014-03-13 NOTE — Progress Notes (Signed)
   Subjective:    Patient ID: Annette Hopkins, female    DOB: November 27, 1970, 43 y.o.   MRN: [id number]  DOS:  03/13/2014 Type of visit - description: CPX History: Feeling very well, good compliance of medication, no concerns  ROS Denies chest pain or difficulty breathing. No lower extremity edema No  nausea, vomiting, diarrhea or blood in the stools. No anxiety or depression. Diet is healthy, and she is extremely active  Past Medical History  Diagnosis Date  . Ulcerative colitis     dx  around 2004  . History of blood clots      B leg DVT 2005, abdomen. On Coumadin for life  . RA (rheumatoid arthritis)     ~2005  . Long term (current) use of anticoagulants   . Abnormal LFTs   . Esophagitis     Past Surgical History  Procedure Laterality Date  . Vascular stents bilateral groins  2006    History   Social History  . Marital Status: Married    Spouse Name: N/A    Number of Children: 1  . Years of Education: N/A   Occupational History  . teacher    Social History Main Topics  . Smoking status: Never Smoker   . Smokeless tobacco: Never Used  . Alcohol Use: Yes  . Drug Use: No  . Sexual Activity: Not on file   Other Topics Concern  . 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 Colon cancer Paternal Grandmother   . Colon polyps Paternal Grandmother   . Breast cancer Mother   . CAD Neg Hx   . Clotting disorder Neg Hx        Medication List       This list is accurate as of: 03/13/14  7:23 PM.  Always use your most recent med list.               levonorgestrel 20 MCG/24HR IUD  Commonly known as:  MIRENA  1 each by Intrauterine route once.     LIALDA PO  Take by mouth 2 (two) times daily.     multivitamin capsule  Take 1 capsule by mouth daily.     warfarin 5 MG tablet  Commonly known as:   COUMADIN  TAKE AS DIRECTED           Objective:   Physical Exam BP 92/60  Pulse 71  Temp(Src) 98.2 F (36.8 C)  Ht 5' 6"  (1.676 m)  Wt 118 lb (53.524 kg)  BMI 19.05 kg/m2  SpO2 99%  General -- alert, well-developed, NAD.  Neck --no thyromegaly   HEENT-- Not pale.  Lungs -- normal respiratory effort, no intercostal retractions, no accessory muscle use, and normal breath sounds.  Heart-- normal rate, regular rhythm, no murmur.  Abdomen-- Not distended, good bowel sounds,soft, non-tender.  Extremities-- no pretibial edema bilaterally  Neurologic--  alert & oriented X3. Speech normal, gait appropriate for age, strength symmetric and appropriate for age.   Psych-- Cognition and judgment appear intact. Cooperative with normal attention span and concentration. No anxious or depressed appearing.        Assessment & Plan:

## 2014-03-13 NOTE — Progress Notes (Signed)
Pre visit review using our clinic review tool, if applicable. No additional management support is needed unless otherwise documented below in the visit note. 

## 2014-03-13 NOTE — Patient Instructions (Signed)
Get your blood work before you leave   Continue with coumadin   Next visit is for a physical exam in 1 year, fasting Please make an appointment

## 2014-03-13 NOTE — Assessment & Plan Note (Addendum)
Tdap   2013 cscope 09-2012 , dr Sharlett Iles, normal, random bx:IBS Female care per gynecologist  She has a very healthy lifestyle Labs  rec ca and vit D, exercise  Other issues:  Ulcerative colitis well-controlled History of rheumatoid arthritis, no symptoms at this time

## 2014-04-11 ENCOUNTER — Ambulatory Visit: Payer: BC Managed Care – PPO | Admitting: *Deleted

## 2014-04-21 ENCOUNTER — Telehealth: Payer: Self-pay | Admitting: Internal Medicine

## 2014-04-21 NOTE — Telephone Encounter (Signed)
Due for an INR, please arrange

## 2014-04-22 NOTE — Telephone Encounter (Signed)
LMOM for Pt to return call and schedule INR.

## 2014-04-25 ENCOUNTER — Telehealth: Payer: Self-pay | Admitting: Hematology & Oncology

## 2014-04-25 ENCOUNTER — Other Ambulatory Visit: Payer: Self-pay | Admitting: Internal Medicine

## 2014-04-25 NOTE — Telephone Encounter (Signed)
Left vm w NEW PATIENT today to remind them of their appointment with Dr. Ennever. Also, advised them to bring all medication bottles and insurance card information. ° °

## 2014-04-26 ENCOUNTER — Ambulatory Visit (HOSPITAL_BASED_OUTPATIENT_CLINIC_OR_DEPARTMENT_OTHER): Payer: BC Managed Care – PPO | Admitting: Lab

## 2014-04-26 ENCOUNTER — Other Ambulatory Visit: Payer: Self-pay | Admitting: Family

## 2014-04-26 ENCOUNTER — Ambulatory Visit (HOSPITAL_BASED_OUTPATIENT_CLINIC_OR_DEPARTMENT_OTHER): Payer: BC Managed Care – PPO | Admitting: Family

## 2014-04-26 ENCOUNTER — Ambulatory Visit: Payer: BC Managed Care – PPO

## 2014-04-26 ENCOUNTER — Telehealth: Payer: Self-pay | Admitting: Hematology & Oncology

## 2014-04-26 VITALS — BP 109/65 | HR 78 | Temp 97.6°F | Resp 16 | Ht 64.0 in | Wt 117.0 lb

## 2014-04-26 DIAGNOSIS — Z86718 Personal history of other venous thrombosis and embolism: Secondary | ICD-10-CM

## 2014-04-26 DIAGNOSIS — Z7901 Long term (current) use of anticoagulants: Secondary | ICD-10-CM

## 2014-04-26 DIAGNOSIS — D649 Anemia, unspecified: Secondary | ICD-10-CM

## 2014-04-26 LAB — CBC WITH DIFFERENTIAL (CANCER CENTER ONLY)
BASO#: 0 10*3/uL (ref 0.0–0.2)
BASO%: 0.1 % (ref 0.0–2.0)
EOS%: 1.6 % (ref 0.0–7.0)
Eosinophils Absolute: 0.1 10*3/uL (ref 0.0–0.5)
HEMATOCRIT: 41.6 % (ref 34.8–46.6)
HEMOGLOBIN: 14.2 g/dL (ref 11.6–15.9)
LYMPH#: 2.1 10*3/uL (ref 0.9–3.3)
LYMPH%: 26.4 % (ref 14.0–48.0)
MCH: 30.6 pg (ref 26.0–34.0)
MCHC: 34.1 g/dL (ref 32.0–36.0)
MCV: 90 fL (ref 81–101)
MONO#: 0.7 10*3/uL (ref 0.1–0.9)
MONO%: 8.6 % (ref 0.0–13.0)
NEUT#: 5.1 10*3/uL (ref 1.5–6.5)
NEUT%: 63.3 % (ref 39.6–80.0)
Platelets: 173 10*3/uL (ref 145–400)
RBC: 4.64 10*6/uL (ref 3.70–5.32)
RDW: 12.5 % (ref 11.1–15.7)
WBC: 8.1 10*3/uL (ref 3.9–10.0)

## 2014-04-26 NOTE — Progress Notes (Signed)
Hematology/Oncology Consultation   Name: Annette Hopkins      MRN: [id number]    Location: Room/bed info not found  Date: 04/26/2014 Time:4:30 PM   REFERRING PHYSICIAN:  [place]:  Management of anticoagulation treatment for history of pelvic and bilateral lower DVTs.    DIAGNOSIS:  History of pelvic and bilateral lower DVTs.  HISTORY OF PRESENT ILLNESS:  Ms. Annette Hopkins is a very pleasant 43 yo female with history of DVTs. She states that she had colitis in 2005-2006 that caused her to be bed bound and she developed a DVT in her left leg at that time. She was treated with Coumadin adn then became pregnant shortly after. During her pregnancy she was put on Heparin because her insurance didn't cover Lovenox and it was too expensive. After having her daughter, a week later she was hospitalized with DVTs in her pelvis and both legs. She had a thrombolysis to to help break up the clots in her legs. She had stent placed in both sides of her groin. She has been on coumadin ever since. She has her level checked monthly at a clinic in town. She has not had a repeat doppler study done for a long time. She had a melanoma removed from her left leg. She sees dermatology for this. She states that all her previous coag studies were negative. She has no personal or familial blood disorder history. Her paternal grandmother had colon cancer. [place] She came back to the [place] in 1992 for college in [place] and moved here to [place] in 2011. She is a Oncologist.  She denies fever, chills, n/v, cough, rash, headaches, dizziness, SOB, chest pain, palpitations, abdominal pain, constipation, diarrhea, blood in urine or stool. She has had no bleeding or pain. She has had no swelling, tenderness, numbness or tingling in her extremities. Her appetite is good and she drinks plenty of fluids. Overall, she seems to be doing very well.    ROS: All other 10 point review of systems was  negative.  PAST MEDICAL HISTORY:   Past Medical History  Diagnosis Date  . Ulcerative colitis     dx  around 2004  . History of blood clots      B leg DVT 2005, abdomen. On Coumadin for life  . RA (rheumatoid arthritis)     ~2005  . Long term (current) use of anticoagulants   . Abnormal LFTs   . Esophagitis    ALLERGIES: Allergies  Allergen Reactions  . Mercaptopurine Nausea And Vomiting  . Sulfa Antibiotics    MEDICATIONS:  Current Outpatient Prescriptions on File Prior to Visit  Medication Sig Dispense Refill  . levonorgestrel (MIRENA) 20 MCG/24HR IUD 1 each by Intrauterine route once.      . Mesalamine (LIALDA PO) Take by mouth 2 (two) times daily.      . Multiple Vitamin (MULTIVITAMIN) capsule Take 1 capsule by mouth daily.       No current facility-administered medications on file prior to visit.   PAST SURGICAL HISTORY Past Surgical History  Procedure Laterality Date  . Vascular stents bilateral groins  2006    FAMILY HISTORY: Family History  Problem Relation Age of Onset  . Colon cancer Paternal Grandmother   . Colon polyps Paternal Grandmother   . Breast cancer Mother   . CAD Neg Hx   . Clotting disorder Neg Hx    SOCIAL HISTORY:  reports that she  has never smoked. She has never used smokeless tobacco. She reports that she drinks alcohol. She reports that she does not use illicit drugs.  PERFORMANCE STATUS: The patient's performance status is 0 - Asymptomatic  PHYSICAL EXAM: Most Recent Vital Signs: Blood pressure 109/65, pulse 78, temperature 97.6 F (36.4 C), temperature source Oral, resp. rate 16, height 5' 4"  (1.626 m), weight 117 lb (53.071 kg). BP 109/65  Pulse 78  Temp(Src) 97.6 F (36.4 C) (Oral)  Resp 16  Ht 5' 4"  (1.626 m)  Wt 117 lb (53.071 kg)  BMI 20.07 kg/m2  General Appearance:    Alert, cooperative, no distress, appears stated age  Head:    Normocephalic, without obvious abnormality, atraumatic  Eyes:    PERRL,  conjunctiva/corneas clear, EOM's intact, fundi    benign, both eyes        Throat:   Lips, mucosa, and tongue normal; teeth and gums normal  Neck:   Supple, symmetrical, trachea midline, no adenopathy;    thyroid:  no enlargement/tenderness/nodules; no carotid   bruit or JVD  Back:     Symmetric, no curvature, ROM normal, no CVA tenderness  Lungs:     Clear to auscultation bilaterally, respirations unlabored  Chest Wall:    No tenderness or deformity   Heart:    Regular rate and rhythm, S1 and S2 normal, no murmur, rub   or gallop  Breast Exam:    Deferred  Abdomeno tenderness, masses, or nipple abnormality:     Soft, non-tender, bowel sounds active all four quadrants,    no masses, no organomegaly        Extremities:   Extremities normal, atraumatic, no cyanosis or edema  Pulses:   2+ and symmetric all extremities  Skin:   Skin color, texture, turgor normal, no rashes or lesions  Lymph nodes:   Cervical, supraclavicular, and axillary nodes normal  Neurologic:   CNII-XII intact, normal strength, sensation and reflexes    throughout   LABORATORY DATA:  Results for orders placed in visit on 04/26/14 (from the past 48 hour(s))  CBC WITH DIFFERENTIAL ([facility])     Status: None   Collection Time    04/26/14  3:02 PM      Result Value Ref Range   WBC 8.1  3.9 - 10.0 10e3/uL   RBC 4.64  3.70 - 5.32 10e6/uL   HGB 14.2  11.6 - 15.9 g/dL   HCT 41.6  34.8 - 46.6 %   MCV 90  81 - 101 fL   MCH 30.6  26.0 - 34.0 pg   MCHC 34.1  32.0 - 36.0 g/dL   RDW 12.5  11.1 - 15.7 %   Platelets 173  145 - 400 10e3/uL   NEUT# 5.1  1.5 - 6.5 10e3/uL   LYMPH# 2.1  0.9 - 3.3 10e3/uL   MONO# 0.7  0.1 - 0.9 10e3/uL   Eosinophils Absolute 0.1  0.0 - 0.5 10e3/uL   BASO# 0.0  0.0 - 0.2 10e3/uL   NEUT% 63.3  39.6 - 80.0 %   LYMPH% 26.4  14.0 - 48.0 %   MONO% 8.6  0.0 - 13.0 %   EOS% 1.6  0.0 - 7.0 %   BASO% 0.1  0.0 - 2.0 %     RADIOGRAPHY: No results found.     PATHOLOGY:   None  ASSESSMENT/PLAN: Annette Hopkins is a very pleasant 43 yo female with history of pelvic and bilateral lower extremity DVTs. She has been on  coumadin therapy for over 10 years. She has had no other clotting issues. Her CBC today look good. We will see what her hypercoagulation panel shows.  We will stop her Coumadin and have her start taking 2 Baby Asprin daily on Sunday.  We will also get a repeat doppler study of her legs.  We gave her a prescription for compression stockings.  We will see what her labs and doppler show but I do not think that we need to make a follow-up appointment at this time.  All questions were answered. The patient knows to call the clinic with any problems, questions or concerns. We can certainly see the patient any time she needs [place]. The patient was discussed with and also seen by Dr. Marin Olp and he is in agreement with the aforementioned.   [place] by Dr. Marin Olp:   ADDENDUM: I saw and examined the patient with Sarah. I agree with the above.  So cannot figure out why she is on anticoagulation for 10 years. She had the episode of thrombosis after her pregnancy. This may be in the event that caused the thrombus.  I really don't see any defined role for therapeutic anticoagulation. I be wallboard about her bleeding to her having another blood clot.  I think 2 baby aspirin would be helpful. This, she'll start over the weekend.  We did send off a hypercoagulable panel on her. We'll see if she is positive for any abnormality.  I understand that  is young. we certainly would want to avoid another blood clot. However, continuing her on therapeutic anticoagulation I think would not drive any benefit.  She has not had any followup Dopplers. I think these would be important for her.  I do think that a compression stocking would be helpful for her. We gave her a prescription for this.  I don't think that we have to get her back to the  office.  We answered all of her questions.  As nice as she is, I don't think we have to get her back to the office unless she does have a problem.  Lum Keas

## 2014-04-26 NOTE — Telephone Encounter (Signed)
Pt aware of 9-30 doppler appointment

## 2014-05-01 ENCOUNTER — Ambulatory Visit (HOSPITAL_BASED_OUTPATIENT_CLINIC_OR_DEPARTMENT_OTHER): Payer: BC Managed Care – PPO

## 2014-05-02 LAB — HYPERCOAGULABLE PANEL, COMPREHENSIVE
ANTICARDIOLIPIN IGG: 10 GPL U/mL (ref <23)
AntiThromb III Func: 116 % (ref 76–126)
Anticardiolipin IgA: 10 APL U/mL (ref <22)
Anticardiolipin IgM: 0 MPL U/mL (ref <11)
Beta-2 Glyco I IgG: 1 G Units (ref <20)
Beta-2-Glycoprotein I IgA: 10 A Units (ref <20)
Beta-2-Glycoprotein I IgM: 5 M Units (ref <20)
DRVVT: 33.6 s (ref <42.9)
LUPUS ANTICOAGULANT: NOT DETECTED
PROTEIN S ACTIVITY: 67 % — AB (ref 69–129)
PROTEIN S TOTAL: 70 % (ref 60–150)
PTT Lupus Anticoagulant: 31.4 secs (ref 28.0–43.0)
Protein C Activity: 139 % — ABNORMAL HIGH (ref 75–133)
Protein C, Total: 78 % (ref 72–160)

## 2014-05-06 ENCOUNTER — Ambulatory Visit (HOSPITAL_BASED_OUTPATIENT_CLINIC_OR_DEPARTMENT_OTHER)
Admission: RE | Admit: 2014-05-06 | Discharge: 2014-05-06 | Disposition: A | Discharge status: Home / Self Care | Payer: BC Managed Care – PPO | Source: Ambulatory Visit | Attending: Family | Admitting: Family

## 2014-05-06 DIAGNOSIS — Z86718 Personal history of other venous thrombosis and embolism: Secondary | ICD-10-CM | POA: Diagnosis present

## 2014-05-16 ENCOUNTER — Telehealth: Payer: Self-pay | Admitting: Nurse Practitioner

## 2014-05-16 NOTE — Telephone Encounter (Addendum)
Message copied by Jimmy Footman on Thu May 16, 2014  6:02 PM ------      Message from: Burney Gauze R      Created: Wed May 15, 2014  7:05 PM       Please call and let her now that the clotting studies are pretty much normal. Keep taking the aspirin. Pete ------Lvm on pt's personal machine.

## 2014-09-13 ENCOUNTER — Encounter: Payer: Self-pay | Admitting: Internal Medicine

## 2014-09-13 ENCOUNTER — Other Ambulatory Visit: Payer: Self-pay | Admitting: *Deleted

## 2014-09-13 ENCOUNTER — Ambulatory Visit (INDEPENDENT_AMBULATORY_CARE_PROVIDER_SITE_OTHER): Payer: 59 | Admitting: Internal Medicine

## 2014-09-13 VITALS — BP 99/69 | HR 67 | Temp 97.8°F | Ht 64.0 in | Wt 112.5 lb

## 2014-09-13 DIAGNOSIS — M545 Low back pain: Secondary | ICD-10-CM

## 2014-09-13 DIAGNOSIS — Z86718 Personal history of other venous thrombosis and embolism: Secondary | ICD-10-CM

## 2014-09-13 DIAGNOSIS — C439 Malignant melanoma of skin, unspecified: Secondary | ICD-10-CM

## 2014-09-13 LAB — URINALYSIS, ROUTINE W REFLEX MICROSCOPIC
Bilirubin Urine: NEGATIVE
HGB URINE DIPSTICK: NEGATIVE
Ketones, ur: NEGATIVE
NITRITE: NEGATIVE
PH: 7.5 (ref 5.0–8.0)
SPECIFIC GRAVITY, URINE: 1.01 (ref 1.000–1.030)
TOTAL PROTEIN, URINE-UPE24: NEGATIVE
URINE GLUCOSE: NEGATIVE
Urobilinogen, UA: 0.2 (ref 0.0–1.0)

## 2014-09-13 NOTE — Progress Notes (Signed)
Pre visit review using our clinic review tool, if applicable. No additional management support is needed unless otherwise documented below in the visit note. 

## 2014-09-13 NOTE — Progress Notes (Signed)
Subjective:    Patient ID: Annette Hopkins, female    DOB: 11-Sep-1970, 44 y.o.   MRN: [id number]  DOS:  09/13/2014 Type of visit - description : Hospital follow-up Interval history: About 12 days ago she started to notice chest pain when she was running, few days later she woke up with intense low back pain bilaterally and some shortness of breath, sx prompted the ER visit to [facility], she was eventually diagnosed with a PE and DVT. Reportedly her heart was check and she was okay. She is now back on anticoagulants, xarelto   Review of Systems  Still has the low back pain without radiation, wonders if she has a UTI.  Denies any fever, chills, dysuria, gross hematuria. Denies chest pain or difficulty breathing; has been able to swimm without DOE even after the recent PE dx No abdominal pain, nausea, vomiting, diarrhea or blood in the stools. Feeling tired, that started before the admission and is not much better. BP noted to be low (but at baseline). Denies dizziness.   Past Medical History  Diagnosis Date  . Ulcerative colitis     dx  around 2004  . History of blood clots      B leg DVT 2005, abdomen. On Coumadin for life  . RA (rheumatoid arthritis)     ~2005  . Long term (current) use of anticoagulants   . Abnormal LFTs   . Esophagitis   . Pulmonary embolism     Past Surgical History  Procedure Laterality Date  . Vascular stents bilateral groins  2006    History   Social History  . Marital Status: Married    Spouse Name: N/A  . Number of Children: 1  . Years of Education: N/A   Occupational History  . teacher    Social History Main Topics  . Smoking status: Never Smoker   . Smokeless tobacco: Never Used  . Alcohol Use: Yes  . Drug Use: No  . Sexual Activity: Not on file   Other Topics Concern  . 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  Always use your most recent med list.               levonorgestrel 20 MCG/24HR IUD  Commonly known as:  MIRENA  1 each by Intrauterine route once.     LIALDA PO  Take by mouth 2 (two) times daily.     multivitamin capsule  Take 1 capsule by mouth daily.     Rivaroxaban 15 MG Tabs tablet  Commonly known as:  XARELTO  Take 15 mg by mouth 2 (two) times daily with a meal.           Objective:   Physical Exam  Constitutional: She is oriented to person, place, and time. She appears well-developed. No distress.  HENT:  Head: Normocephalic and atraumatic.  Cardiovascular:  RRR, no murmur , rub or gallop  Pulmonary/Chest: Effort normal. No respiratory distress.  CTA B  Abdominal: Soft. Bowel sounds are normal. She exhibits no distension and no mass. There is no tenderness. There is no rebound and no guarding.  No CVA tenderness  Musculoskeletal: She exhibits no  edema (calves  are symmetric and not tender) or tenderness.  Neurological: She is alert and oriented to person, place, and time. No cranial nerve deficit. She exhibits normal muscle tone. Coordination normal.  Speech normal, gait unassisted and normal for age, motor strength appropriate for age   Skin: Skin is warm and dry. No pallor.  No jaundice  Psychiatric: She has a normal mood and affect. Her behavior is normal. Judgment and thought content normal.  Vitals reviewed.  BP 99/69 mmHg  Pulse 67  Temp(Src) 97.8 F (36.6 C) (Oral)  Ht 5' 4"  (1.626 m)  Wt 112 lb 8 oz (51.03 kg)  BMI 19.30 kg/m2  SpO2 98%     Assessment & Plan:   Problem List Items Addressed This Visit      Other   History of blood clots    Patient was recently admitted with an unprovoked PE and DVT. Few months ago, Coumadin was switch to aspirin since she was doing very well for many years. At this point, she seems to be doing well after the admission.  Planning to get records as soon as possible , will order follow-up labs if needed. She does have a persistent back pain, request a urine to be checked (UTI?) which were doing noting has no sx c/w uti. She also feeling tired, BPs in the low side but at baseline consequently will recommend observation. Plan: Continue with Xarelto likely indefinitely She has an appointment to see hematology next week, she is very appreciative of hematology advice.       Other Visit Diagnoses    Low back pain without sciatica, unspecified back pain laterality    -  Primary    Relevant Orders    Urinalysis, Routine w reflex microscopic (Completed)    Urine culture

## 2014-09-13 NOTE — Assessment & Plan Note (Addendum)
Patient was recently admitted with an unprovoked PE and DVT. Few months ago, Coumadin was switch to aspirin since she was doing very well for many years. At this point, she seems to be doing well after the admission. Planning to get records as soon as possible , will order follow-up labs if needed. She does have a persistent back pain, request a urine to be checked (UTI?) which were doing noting has no sx c/w uti. She also feeling tired, BPs in the low side but at baseline consequently will recommend observation. Plan: Continue with Xarelto likely indefinitely She has an appointment to see hematology next week, she is very appreciative of hematology advice. Addendum, records reviewed. Troponin negative, hemoglobin 13.2, d-dimer 1.8, creatinine 0.6, LFTs normal, cholesterol 172, LDL 87. CT chest:  +PE at the posterior basal segment of the left lower lobe, question of changes consistent with early pulmonary infarct, small left pleural effusion. Ultrasound right leg no DVT, ultrasound left leg + DVT Echocardiogram without changes of R  heart strain

## 2014-09-13 NOTE — Patient Instructions (Signed)
Go to the labs for a urine sample  Next visit for a physical by 03-2015

## 2014-09-15 LAB — URINE CULTURE: Colony Count: 70000

## 2014-09-16 ENCOUNTER — Other Ambulatory Visit: Payer: BLUE CROSS/BLUE SHIELD | Admitting: Lab

## 2014-09-16 ENCOUNTER — Telehealth: Payer: Self-pay | Admitting: Hematology & Oncology

## 2014-09-16 ENCOUNTER — Ambulatory Visit: Payer: Self-pay | Admitting: Hematology & Oncology

## 2014-09-16 NOTE — Telephone Encounter (Signed)
Pt moved 2-15 to 3-2 due to weather

## 2014-10-02 ENCOUNTER — Ambulatory Visit (HOSPITAL_BASED_OUTPATIENT_CLINIC_OR_DEPARTMENT_OTHER): Payer: 59 | Admitting: Hematology & Oncology

## 2014-10-02 ENCOUNTER — Other Ambulatory Visit (HOSPITAL_BASED_OUTPATIENT_CLINIC_OR_DEPARTMENT_OTHER): Payer: 59 | Admitting: Lab

## 2014-10-02 ENCOUNTER — Encounter: Payer: Self-pay | Admitting: Hematology & Oncology

## 2014-10-02 VITALS — BP 106/66 | HR 68 | Temp 97.7°F | Resp 14 | Ht 64.0 in | Wt 113.0 lb

## 2014-10-02 DIAGNOSIS — C439 Malignant melanoma of skin, unspecified: Secondary | ICD-10-CM

## 2014-10-02 DIAGNOSIS — Z7901 Long term (current) use of anticoagulants: Secondary | ICD-10-CM

## 2014-10-02 DIAGNOSIS — I749 Embolism and thrombosis of unspecified artery: Secondary | ICD-10-CM

## 2014-10-02 DIAGNOSIS — Z86718 Personal history of other venous thrombosis and embolism: Secondary | ICD-10-CM

## 2014-10-02 LAB — CBC WITH DIFFERENTIAL (CANCER CENTER ONLY)
BASO#: 0 10*3/uL (ref 0.0–0.2)
BASO%: 0.2 % (ref 0.0–2.0)
EOS%: 2.3 % (ref 0.0–7.0)
Eosinophils Absolute: 0.1 10*3/uL (ref 0.0–0.5)
HEMATOCRIT: 41.7 % (ref 34.8–46.6)
HEMOGLOBIN: 14 g/dL (ref 11.6–15.9)
LYMPH#: 2 10*3/uL (ref 0.9–3.3)
LYMPH%: 35.3 % (ref 14.0–48.0)
MCH: 30.1 pg (ref 26.0–34.0)
MCHC: 33.6 g/dL (ref 32.0–36.0)
MCV: 90 fL (ref 81–101)
MONO#: 0.5 10*3/uL (ref 0.1–0.9)
MONO%: 8.9 % (ref 0.0–13.0)
NEUT#: 3.1 10*3/uL (ref 1.5–6.5)
NEUT%: 53.3 % (ref 39.6–80.0)
Platelets: 179 10*3/uL (ref 145–400)
RBC: 4.65 10*6/uL (ref 3.70–5.32)
RDW: 12.7 % (ref 11.1–15.7)
WBC: 5.8 10*3/uL (ref 3.9–10.0)

## 2014-10-02 NOTE — Progress Notes (Signed)
Hematology and Oncology Follow Up Visit  Annette Hopkins [id number] Mar 17, 1971 44 y.o. 10/02/2014   Principle Diagnosis:   Recurrent pulmonary embolism and left leg DVT  Ulcerative colitis  Current Therapy:    Xarelto 20 mg by mouth daily-lifelong     Interim History:  Ms.  Hopkins is back for follow-up. We first saw her back in September. I really do not think that we would have to see her again.  We did do a Doppler of her legs. At that time, there is no evidence that there is any residual thromboembolic disease.  She drove to [place] over Christmas for her in-laws. It was a 12 Hour ride.  She did not drive at all.  About a month ago,, she began to have chest wall pain. She began has some shortness of breath.  She went to [facility]. She apparently had a CT angiogram which showed pulmonary emboli. She had Doppler of her legs. She had thrombus in the posterior tibial vein and peroneal vein in the left leg.  She was placed on Xarelto.  When we saw her, we did do a hypercoagulable panel on her. Everything came back okay.  She is feeling better. She's not having chest wall pain. She's not having cough. She's exercising.  She never had any type of leg pain. There is no leg swelling.  Unfortunately, I think she will need lifelong anticoagulation.  There's been no bleeding. She does have a Mirena IUD but I don't think this is an issue.  She does have ulcerative colitis but this is not flaring up.   Medications:  Current outpatient prescriptions:  .  levonorgestrel (MIRENA) 20 MCG/24HR IUD, 1 each by Intrauterine route once., Disp: , Rfl:  .  Mesalamine (LIALDA PO), Take by mouth 2 (two) times daily., Disp: , Rfl:  .  Multiple Vitamin (MULTIVITAMIN) capsule, Take 1 capsule by mouth daily., Disp: , Rfl:  .  Rivaroxaban (XARELTO) 15 MG TABS tablet, Take 15 mg by mouth 2 (two) times daily with a meal., Disp: , Rfl:   Allergies:  Allergies  Allergen Reactions  .  Mercaptopurine Nausea And Vomiting  . Sulfa Antibiotics     Past Medical History, Surgical history, Social history, and Family History were reviewed and updated.  Review of Syste Physical Exam:  height is 5' 4"  (1.626 m) and weight is 113 lb (51.256 kg). Her oral temperature is 97.7 F (36.5 C). Her blood pressure is 106/66 and her pulse is 68. Her respiration is 14.   well developed and well-nourished white female. Head and neck exam shows no ocular or oral lesions. She has a palpable cervical or supraclavicular lymph nodes. Lungs are clear. There are no rales, wheezes or rhonchi. Cardiac exam regular rate and rhythm with no murmurs, rubs or bruits. Abdomen is soft. She has good bowel sounds. There is no fluid wave. There is no palpable liver or spleen tip. Back exam shows no tenderness over the spine, ribs or hips. Extremities shows no clubbing, cyanosis or edema. She has no venous cord in the legs. She is a negative Homans sign. Neurological exam is nonfocal   Lab Results  Component Value Date   WBC 5.8 10/02/2014   HGB 14.0 10/02/2014   HCT 41.7 10/02/2014   MCV 90 10/02/2014   PLT 179 10/02/2014     Chemistry      Component Value Date/Time   NA 137 11/01/2013 1456   K 3.8 11/01/2013 1456   CL  99 11/01/2013 1456   CO2 29 11/01/2013 1456   BUN 12 11/01/2013 1456   CREATININE 0.7 11/01/2013 1456      Component Value Date/Time   CALCIUM 9.3 11/01/2013 1456   ALKPHOS 54 11/01/2013 1456   AST 22 11/01/2013 1456   ALT 18 11/01/2013 1456   BILITOT 0.6 11/01/2013 1456         Impression and Plan: Annette Hopkins is  44 year old white female. She has a history of thromboembolic disease. She has not had a blood clot probably for 15 years.  We saw her in September. We got her off anticoagulation. An had her on aspirin.  It is obvious that she is hypercoagulable. Again, all of her studies have come back negative.  He will need lifelong anticoagulation. She is happy being on  Xarelto.  I will see her back in 2 months. I will repeat a CT angiogram and Doppler of the left leg we see her back that day.  I spent about 45 mins with her. I will have to get the result from the CT angiogram done at [facility] back in February.    Volanda Napoleon, MD 3/2/20165:21 PM

## 2014-10-03 ENCOUNTER — Telehealth: Payer: Self-pay | Admitting: Hematology & Oncology

## 2014-10-03 ENCOUNTER — Encounter: Payer: Self-pay | Admitting: Hematology & Oncology

## 2014-10-03 NOTE — Telephone Encounter (Signed)
Left pt message to call for appointment preferences in may.

## 2014-10-04 ENCOUNTER — Telehealth: Payer: Self-pay | Admitting: Hematology & Oncology

## 2014-10-04 NOTE — Telephone Encounter (Signed)
Left pt message to call for appointments in may can't do 5-5.

## 2014-10-25 ENCOUNTER — Other Ambulatory Visit: Payer: Self-pay | Admitting: *Deleted

## 2014-10-25 DIAGNOSIS — C439 Malignant melanoma of skin, unspecified: Secondary | ICD-10-CM

## 2014-10-25 MED ORDER — RIVAROXABAN 20 MG PO TABS: 20.0000 mg | ORAL_TABLET | Freq: Every day | ORAL | Status: DC

## 2014-11-28 ENCOUNTER — Telehealth: Payer: Self-pay | Admitting: Hematology & Oncology

## 2014-11-28 NOTE — Telephone Encounter (Signed)
Pt left message cx 5-13 appointments. I left her message all appointment cx plus scans and to call back to r/s. RN aware

## 2014-11-29 ENCOUNTER — Telehealth: Payer: Self-pay | Admitting: Hematology & Oncology

## 2014-11-29 NOTE — Telephone Encounter (Signed)
Pt r/s cx 5-13 to 6-13

## 2014-12-05 ENCOUNTER — Ambulatory Visit: Payer: 59 | Admitting: Hematology & Oncology

## 2014-12-05 ENCOUNTER — Other Ambulatory Visit: Payer: 59 | Admitting: Lab

## 2014-12-10 ENCOUNTER — Other Ambulatory Visit: Payer: Self-pay | Admitting: Physician Assistant

## 2014-12-13 ENCOUNTER — Ambulatory Visit (HOSPITAL_BASED_OUTPATIENT_CLINIC_OR_DEPARTMENT_OTHER): Payer: 59

## 2014-12-13 ENCOUNTER — Other Ambulatory Visit: Payer: 59

## 2014-12-13 ENCOUNTER — Ambulatory Visit: Payer: 59 | Admitting: Family

## 2014-12-31 ENCOUNTER — Other Ambulatory Visit: Payer: Self-pay | Admitting: Hematology & Oncology

## 2015-01-13 ENCOUNTER — Other Ambulatory Visit (HOSPITAL_BASED_OUTPATIENT_CLINIC_OR_DEPARTMENT_OTHER): Payer: 59

## 2015-01-13 ENCOUNTER — Ambulatory Visit (HOSPITAL_BASED_OUTPATIENT_CLINIC_OR_DEPARTMENT_OTHER)
Admission: RE | Admit: 2015-01-13 | Discharge: 2015-01-13 | Disposition: A | Discharge status: Home / Self Care | Payer: 59 | Source: Ambulatory Visit | Attending: Hematology & Oncology | Admitting: Hematology & Oncology

## 2015-01-13 ENCOUNTER — Encounter: Payer: Self-pay | Admitting: *Deleted

## 2015-01-13 ENCOUNTER — Encounter: Payer: Self-pay | Admitting: Family

## 2015-01-13 ENCOUNTER — Ambulatory Visit (HOSPITAL_BASED_OUTPATIENT_CLINIC_OR_DEPARTMENT_OTHER): Payer: 59 | Admitting: Family

## 2015-01-13 VITALS — BP 95/61 | HR 68 | Temp 97.6°F | Resp 14 | Wt 115.0 lb

## 2015-01-13 DIAGNOSIS — Z86711 Personal history of pulmonary embolism: Secondary | ICD-10-CM | POA: Diagnosis not present

## 2015-01-13 DIAGNOSIS — I2699 Other pulmonary embolism without acute cor pulmonale: Secondary | ICD-10-CM | POA: Insufficient documentation

## 2015-01-13 DIAGNOSIS — Z86718 Personal history of other venous thrombosis and embolism: Secondary | ICD-10-CM | POA: Diagnosis present

## 2015-01-13 DIAGNOSIS — Z7901 Long term (current) use of anticoagulants: Secondary | ICD-10-CM | POA: Diagnosis not present

## 2015-01-13 LAB — CBC WITH DIFFERENTIAL (CANCER CENTER ONLY)
BASO#: 0 10*3/uL (ref 0.0–0.2)
BASO%: 0.2 % (ref 0.0–2.0)
EOS ABS: 0.1 10*3/uL (ref 0.0–0.5)
EOS%: 1.2 % (ref 0.0–7.0)
HCT: 41.2 % (ref 34.8–46.6)
HGB: 14.4 g/dL (ref 11.6–15.9)
LYMPH#: 1.4 10*3/uL (ref 0.9–3.3)
LYMPH%: 23.4 % (ref 14.0–48.0)
MCH: 31 pg (ref 26.0–34.0)
MCHC: 35 g/dL (ref 32.0–36.0)
MCV: 89 fL (ref 81–101)
MONO#: 0.5 10*3/uL (ref 0.1–0.9)
MONO%: 7.5 % (ref 0.0–13.0)
NEUT%: 67.7 % (ref 39.6–80.0)
NEUTROS ABS: 4.1 10*3/uL (ref 1.5–6.5)
Platelets: 180 10*3/uL (ref 145–400)
RBC: 4.64 10*6/uL (ref 3.70–5.32)
RDW: 12.6 % (ref 11.1–15.7)
WBC: 6 10*3/uL (ref 3.9–10.0)

## 2015-01-13 MED ORDER — IOHEXOL 350 MG/ML SOLN
100.0000 mL | Freq: Once | INTRAVENOUS | Status: AC | PRN
  Administered 2015-01-13: 100 mL via INTRAVENOUS

## 2015-01-13 NOTE — Progress Notes (Signed)
Hematology and Oncology Follow Up Visit  Annette Hopkins [id number] [birth year] 44 y.o. 01/13/2015   Principle Diagnosis:  Recurrent pulmonary embolism and left leg DVT  Current Therapy:   Xarelto 20 mg by mouth daily-lifelong    Interim History:  Annette Hopkins is here today for a follow-up. She is doing quite well and has no complaints at this time.  She had a CT angio of the chest today which showed complete resolution of PE. Her left lower extremity US also showed no evidence of DVT.  She will continue on life long anticoagulation with Xarelto.  She has had no bruising or episodes of bleeding.  No fever, chills, rash, cough, SOB, chest pain, palpitations, abdominal pain, constipation, diarrhea, blood in urine or stool.  She has not had a flare with her UC for a little while.  No swelling, tenderness, numbness or tingling in her extremities at this time. She does have tenderness in her left leg periodically when she is on her feet for an extended period of time at school.  She is happy to be out for the summer and plans to just relax and rest.  She is eating well and staying hydrated. Her weight is stable.   Medications:    Medication List       This list is accurate as of: 01/13/15  4:24 PM.  Always use your most recent med list.               levonorgestrel 20 MCG/24HR IUD  Commonly known as:  MIRENA  1 each by Intrauterine route once.     LIALDA 1.2 G EC tablet  Generic drug:  mesalamine  TAKE 2 TABLETS BY MOUTH EVERY DAY     multivitamin capsule  Take 1 capsule by mouth daily.     XARELTO 20 MG Tabs tablet  Generic drug:  rivaroxaban  TAKE 1 TABLET(20 MG) BY MOUTH DAILY WITH DINNER        Allergies:  Allergies  Allergen Reactions  . Mercaptopurine Nausea And Vomiting  . Sulfa Antibiotics     Past Medical History, Surgical history, Social history, and Family History were reviewed and updated.  Review of Systems: All other 10 point review of systems is  negative.   Physical Exam:  weight is 115 lb (52.164 kg). Her oral temperature is 97.6 F (36.4 C). Her blood pressure is 95/61 and her pulse is 68. Her respiration is 14.   Wt Readings from Last 3 Encounters:  01/13/15 115 lb (52.164 kg)  10/02/14 113 lb (51.256 kg)  09/13/14 112 lb 8 oz (51.03 kg)    Ocular: Sclerae unicteric, pupils equal, round and reactive to light Ear-nose-throat: Oropharynx clear, dentition fair Lymphatic: No cervical or supraclavicular adenopathy Lungs no rales or rhonchi, good excursion bilaterally Heart regular rate and rhythm, no murmur appreciated Abd soft, nontender, positive bowel sounds MSK no focal spinal tenderness, no joint edema Neuro: non-focal, well-oriented, appropriate affect Breasts: Deferred  Lab Results  Component Value Date   WBC 6.0 01/13/2015   HGB 14.4 01/13/2015   HCT 41.2 01/13/2015   MCV 89 01/13/2015   PLT 180 01/13/2015   Lab Results  Component Value Date   FERRITIN 299.8* 02/16/2011   IRON 202* 02/16/2011   IRONPCTSAT 84.9* 02/16/2011   Lab Results  Component Value Date   RETICCTPCT 4.85* 02/25/2011   RBC 4.64 01/13/2015   RETICCTABS 102.82* 02/25/2011   No results found for: KPAFRELGTCHN, LAMBDASER, KAPLAMBRATIO Lab Results  Component Value Date  IGA 345 02/16/2011   No results found for: Odetta Pink, SPEI   Chemistry      Component Value Date/Time   NA 137 11/01/2013 1456   K 3.8 11/01/2013 1456   CL 99 11/01/2013 1456   CO2 29 11/01/2013 1456   BUN 12 11/01/2013 1456   CREATININE 0.7 11/01/2013 1456      Component Value Date/Time   CALCIUM 9.3 11/01/2013 1456   ALKPHOS 54 11/01/2013 1456   AST 22 11/01/2013 1456   ALT 18 11/01/2013 1456   BILITOT 0.6 11/01/2013 1456     Impression and Plan: Annette Hopkins is 44 year old white female with a recurrent pulmonary embolism and left leg DVT while on aspirin. According to 6/13 CT angio and left lower  extremity US both of these issues have now resolved. She is on lifelong anticoagulation with Xarelto.  She is asymptomatic at this time and has had no problems with bruising or bleeding while on anticoagulation.  We will plan to see her back in 6 months for labs and follow-up.  She knows to call with any questions or concerns. We can certainly see her sooner if need be.   Eliezer Bottom, NP 6/13/20164:24 PM

## 2015-01-16 LAB — BASIC METABOLIC PANEL
BUN: 10 mg/dL (ref 6–23)
CO2: 27 mEq/L (ref 19–32)
Calcium: 9.5 mg/dL (ref 8.4–10.5)
Chloride: 100 mEq/L (ref 96–112)
Creatinine, Ser: 0.74 mg/dL (ref 0.50–1.10)
GLUCOSE: 86 mg/dL (ref 70–99)
POTASSIUM: 4.8 meq/L (ref 3.5–5.3)
SODIUM: 135 meq/L (ref 135–145)

## 2015-01-16 LAB — PROTEIN S, TOTAL: Protein S Total: 69 % (ref 60–150)

## 2015-01-16 LAB — PROTEIN S ACTIVITY: PROTEIN S ACTIVITY: 91 % (ref 69–129)

## 2015-01-21 ENCOUNTER — Other Ambulatory Visit: Payer: Self-pay | Admitting: Physician Assistant

## 2015-01-21 ENCOUNTER — Telehealth: Payer: Self-pay | Admitting: *Deleted

## 2015-01-21 NOTE — Telephone Encounter (Signed)
LM for the patient to advise we sent 1 refill for the Lialda 1.2 mg. For further refills she will need to call our office and make an appointment to see Dr. Zenovia Jarred.  She saw Nicoletta Ba PA last  10/2013.

## 2015-02-24 ENCOUNTER — Other Ambulatory Visit: Payer: Self-pay | Admitting: Physician Assistant

## 2015-03-14 ENCOUNTER — Other Ambulatory Visit: Payer: Self-pay | Admitting: Hematology & Oncology

## 2015-03-19 ENCOUNTER — Telehealth: Payer: Self-pay | Admitting: Hematology & Oncology

## 2015-03-19 NOTE — Telephone Encounter (Signed)
Returned pt call regarding scheduling future appt.  Gave note to nurse regarding call.

## 2015-03-29 ENCOUNTER — Other Ambulatory Visit: Payer: Self-pay | Admitting: Physician Assistant

## 2015-04-10 ENCOUNTER — Other Ambulatory Visit: Payer: Self-pay | Admitting: Hematology & Oncology

## 2015-04-18 ENCOUNTER — Ambulatory Visit: Payer: 59 | Admitting: Internal Medicine

## 2015-04-18 ENCOUNTER — Ambulatory Visit (INDEPENDENT_AMBULATORY_CARE_PROVIDER_SITE_OTHER): Payer: 59 | Admitting: Internal Medicine

## 2015-04-18 ENCOUNTER — Encounter: Payer: Self-pay | Admitting: Internal Medicine

## 2015-04-18 VITALS — BP 110/60 | HR 80 | Ht 63.58 in | Wt 117.2 lb

## 2015-04-18 DIAGNOSIS — K5190 Ulcerative colitis, unspecified, without complications: Secondary | ICD-10-CM

## 2015-04-18 DIAGNOSIS — Z7901 Long term (current) use of anticoagulants: Secondary | ICD-10-CM | POA: Diagnosis not present

## 2015-04-18 NOTE — Progress Notes (Signed)
Patient ID: Annette Hopkins, female   DOB: 1971-03-15, 44 y.o.   MRN: [id number] HPI: [name] is a 44 year old female with a history of pan ulcerative colitis diagnosed around 2003, history of DVT and PE on Xarelto, history of RA, who is seen in follow-up. She was previously seen and managed by Dr. Verl Blalock. She is here today with her 44 year old daughter. She has been doing well clinically and been taking Lialda 2.4 g daily though she said when refilled last month the dose was decreased to 1.2 g daily. She denies current colitis symptoms. She reports no abdominal pain. Bowel movements a been regular without blood in her stool or melena. Appetite is good. Weight is stable. She has had intermittent issues with oral ulcerations over the last few months but these have resolved recently. Does have history of joint pain particularly with back and knees. No rashes. She denies upper abdominal complaint including nausea, vomiting, dysphagia and early satiety. No hepatobiliary complaint.  When diagnosed she was treated with steroids for a number of years. She was also given azathioprine and took this for several years. Later 6-MP caused severe nausea and vomiting.  She has history of DVT and was taken off anticoagulation August 2015. Had PE in February 2016 and thus is maintained on Xarelto with planned lifelong anticoagulation.  No first-degree family history of colorectal cancer. Paternal grandmother had colorectal cancer.  Last colonoscopy performed by Dr. Verl Blalock on 10/30/2012. This was endoscopically normal though biopsies showed chronic active colitis in the right colon without dysplasia. An benign mucosa in the left colon  Past Medical History  Diagnosis Date  . Ulcerative colitis     dx  around 2004  . History of blood clots      B leg DVT 2005, abdomen. On Coumadin for life  . RA (rheumatoid arthritis)     ~2005  . Long term (current) use of anticoagulants   . Abnormal LFTs   .  Esophagitis   . Pulmonary embolism     Past Surgical History  Procedure Laterality Date  . Vascular stents bilateral groins  2006    Outpatient Prescriptions Prior to Visit  Medication Sig Dispense Refill  . levonorgestrel (MIRENA) 20 MCG/24HR IUD 1 each by Intrauterine route once.    . mesalamine (LIALDA) 1.2 G EC tablet Take 1 tablet (1.2 g total) by mouth daily. 60 tablet 0  . Multiple Vitamin (MULTIVITAMIN) capsule Take 1 capsule by mouth daily.    Alveda Reasons 20 MG TABS tablet TAKE 1 TABLET(20 MG) BY MOUTH DAILY WITH DINNER 30 tablet 0   No facility-administered medications prior to visit.    Allergies  Allergen Reactions  . Mercaptopurine Nausea And Vomiting  . Sulfa Antibiotics     Family History  Problem Relation Age of Onset  . Colon cancer Paternal Grandmother   . Colon polyps Paternal Grandmother   . Breast cancer Mother   . CAD Neg Hx   . Clotting disorder Neg Hx     Social History  Substance Use Topics  . Smoking status: Never Smoker   . Smokeless tobacco: Never Used     Comment: Never Used Tobacco  . Alcohol Use: 0.0 oz/week    0 Standard drinks or equivalent per week    ROS: As per history of present illness, otherwise negative  BP 110/60 mmHg  Pulse 80  Ht 5' 3.58" (1.615 m)  Wt 117 lb 4 oz (53.184 kg)  BMI 20.39 kg/m2 Constitutional: Well-developed and  well-nourished. No distress. HEENT: Normocephalic and atraumatic. Oropharynx is clear and moist. No oropharyngeal exudate. Conjunctivae are normal.  No scleral icterus. Neck: Neck supple. Trachea midline. Cardiovascular: Normal rate, regular rhythm and intact distal pulses. No M/R/G Pulmonary/chest: Effort normal and breath sounds normal. No wheezing, rales or rhonchi. Abdominal: Soft, nontender, nondistended. Bowel sounds active throughout. There are no masses palpable. No hepatosplenomegaly. Extremities: no clubbing, cyanosis, or edema Lymphadenopathy: No cervical adenopathy  noted. Neurological: Alert and oriented to person place and time. Skin: Skin is warm and dry. No rashes noted. Psychiatric: Normal mood and affect. Behavior is normal.  RELEVANT LABS AND IMAGING: CBC    Component Value Date/Time   WBC 6.0 01/13/2015 1053   WBC 7.1 11/01/2013 1456   WBC 7.2 08/02/2011 1207   RBC 4.64 01/13/2015 1053   RBC 4.83 11/01/2013 1456   RBC 4.55 08/02/2011 1207   HGB 14.4 01/13/2015 1053   HGB 14.7 11/01/2013 1456   HGB 14.1 08/02/2011 1207   HCT 41.2 01/13/2015 1053   HCT 43.4 11/01/2013 1456   HCT 41.3 08/02/2011 1207   PLT 180 01/13/2015 1053   PLT 203.0 11/01/2013 1456   PLT 172 08/02/2011 1207   MCV 89 01/13/2015 1053   MCV 89.8 11/01/2013 1456   MCV 90.9 08/02/2011 1207   MCH 31.0 01/13/2015 1053   MCH 31.0 08/02/2011 1207   MCHC 35.0 01/13/2015 1053   MCHC 33.9 11/01/2013 1456   MCHC 34.0 08/02/2011 1207   RDW 12.6 01/13/2015 1053   RDW 12.8 11/01/2013 1456   RDW 12.7 08/02/2011 1207   LYMPHSABS 1.4 01/13/2015 1053   LYMPHSABS 2.1 11/01/2013 1456   LYMPHSABS 1.7 08/02/2011 1207   MONOABS 0.5 11/01/2013 1456   MONOABS 0.6 08/02/2011 1207   EOSABS 0.1 01/13/2015 1053   EOSABS 0.2 11/01/2013 1456   EOSABS 0.1 08/02/2011 1207   BASOSABS 0.0 01/13/2015 1053   BASOSABS 0.0 11/01/2013 1456   BASOSABS 0.0 08/02/2011 1207    CMP     Component Value Date/Time   NA 135 01/13/2015 1054   K 4.8 01/13/2015 1054   CL 100 01/13/2015 1054   CO2 27 01/13/2015 1054   GLUCOSE 86 01/13/2015 1054   BUN 10 01/13/2015 1054   CREATININE 0.74 01/13/2015 1054   CALCIUM 9.5 01/13/2015 1054   PROT 7.7 11/01/2013 1456   ALBUMIN 4.5 11/01/2013 1456   AST 22 11/01/2013 1456   ALT 18 11/01/2013 1456   ALKPHOS 54 11/01/2013 1456   BILITOT 0.6 11/01/2013 1456    ASSESSMENT/PLAN: 44 year old female with a history of pan ulcerative colitis diagnosed around 2003, history of DVT and PE on Xarelto, history of RA, who is seen in follow-up.   1. UC, pan dx  2003 -- long-standing pancolitis currently in clinical remission. She did have evidence of mild histologic disease in the right colon in 2014. I recommended a dose of Lialda 2.4 g daily on a long-term basis for maintenance. We discussed guidelines that support surveillance colonoscopy with 4 quadrant biopsy every 12-24 months. She is due at this time. We discussed the risks, benefits and alternatives to colonoscopy and she is agreeable to proceed. We will contact Dr. Marin Olp to see permission to hold Xarelto. I recommended bone mineral density testing and also influenza and pneumococcal vaccination. --One-year office follow-up --Surveillance colonoscopy as above  25 minutes spent with the patient today    OE:VOJJ E Paz, [place], [place] [id number]

## 2015-04-18 NOTE — Patient Instructions (Addendum)
We have sent the following medications to your pharmacy for you to pick up at your convenience: Lialda 2.4 grams daily (2 tablets once daily)  Please follow up with Dr Hilarie Fredrickson in 1 year.  We have recommended the flu vaccine to you but you declined. Should you change your mind, please let us kno.  We have recommended colonoscopy at this time. Since you need to wait until your school break, we will send you a letter to remind you to schedule in February.  You have been scheduled for a bone density test on Friday, 04/25/15 at 2:00 pm. Please go to radiology on the basement floor of [facility] for this test. No preparation is necessary.

## 2015-04-21 ENCOUNTER — Telehealth: Payer: Self-pay | Admitting: *Deleted

## 2015-04-21 NOTE — Telephone Encounter (Signed)
04/21/2015 RE: Annette Hopkins DOB: 1971-01-21 MRN: [id number]  Dear Dr Marin Olp,   We will be scheduling the above patient for an endoscopic procedure around 09/2015. Our records show that she is on anticoagulation therapy.  Please advise as to whether the patient may come off her therapy of Xarelto prior to the procedure. Please route your response to Dixon Boos, CMA  Sincerely,  Dixon Boos

## 2015-04-22 NOTE — Telephone Encounter (Signed)
Volanda Napoleon, MD  Larina Bras, CMA           Dorothy:   Just stop the Xarelto 1-2 days before the procedure and then re-start day after.   I will pray about this!!   Laurey Arrow e

## 2015-04-22 NOTE — Telephone Encounter (Signed)
Left a message for patient to return my call. 

## 2015-04-24 NOTE — Telephone Encounter (Signed)
I have left a message for patient to call back.

## 2015-04-24 NOTE — Telephone Encounter (Signed)
Per patient request, I have left a voicemail indicating that she may hold xarelto 2 days prior to her procedure. We will also go over this with her when she schedules her endoscopic procedures.

## 2015-04-25 ENCOUNTER — Telehealth: Payer: Self-pay | Admitting: Internal Medicine

## 2015-04-25 ENCOUNTER — Ambulatory Visit (INDEPENDENT_AMBULATORY_CARE_PROVIDER_SITE_OTHER)
Admission: RE | Admit: 2015-04-25 | Discharge: 2015-04-25 | Disposition: A | Discharge status: Home / Self Care | Payer: 59 | Source: Ambulatory Visit | Attending: Internal Medicine | Admitting: Internal Medicine

## 2015-04-25 DIAGNOSIS — Z13820 Encounter for screening for osteoporosis: Secondary | ICD-10-CM | POA: Diagnosis not present

## 2015-04-25 DIAGNOSIS — K5190 Ulcerative colitis, unspecified, without complications: Secondary | ICD-10-CM | POA: Diagnosis not present

## 2015-04-28 ENCOUNTER — Telehealth: Payer: Self-pay

## 2015-04-28 ENCOUNTER — Other Ambulatory Visit: Payer: Self-pay

## 2015-04-28 MED ORDER — MESALAMINE 1.2 G PO TBEC: 2.4000 g | DELAYED_RELEASE_TABLET | Freq: Every day | ORAL | Status: DC

## 2015-04-28 NOTE — Telephone Encounter (Signed)
Pt left message that she wants to schedule her colon in Nov or Dec on a Monday. Pt is not due until Feb 2017. Waiting for pt to call back to see if she has any issues where colon needs to be done sooner. Pt also states her lialda script was for 1 tab daily and should be for bid quantity wise. Left message for pt to call back.

## 2015-04-28 NOTE — Telephone Encounter (Signed)
Dr. Marin Olp this pt is scheduled for a colon 06/23/15 with Dr. Hilarie Fredrickson. Pt is taking Xerelto. Please advise if pt may hold Xerelto 24-48 hours prior to colonoscopy. Please advise.

## 2015-04-28 NOTE — Telephone Encounter (Signed)
Left message for pt to call back  °

## 2015-04-29 ENCOUNTER — Telehealth: Payer: Self-pay

## 2015-04-29 NOTE — Telephone Encounter (Signed)
Volanda Napoleon, MD  [name], RN           Vaughan Basta:   She can stop the Xarelto 1-2 days before the procedure!!

## 2015-05-12 ENCOUNTER — Other Ambulatory Visit: Payer: Self-pay | Admitting: Hematology & Oncology

## 2015-06-14 ENCOUNTER — Other Ambulatory Visit: Payer: Self-pay | Admitting: Hematology & Oncology

## 2015-06-16 ENCOUNTER — Telehealth: Payer: Self-pay

## 2015-06-16 ENCOUNTER — Ambulatory Visit (AMBULATORY_SURGERY_CENTER): Payer: Self-pay

## 2015-06-16 VITALS — Ht 64.0 in | Wt 117.0 lb

## 2015-06-16 DIAGNOSIS — K5100 Ulcerative (chronic) pancolitis without complications: Secondary | ICD-10-CM

## 2015-06-16 MED ORDER — SUPREP BOWEL PREP KIT 17.5-3.13-1.6 GM/177ML PO SOLN: 1.0000 | Freq: Once | ORAL | Status: DC

## 2015-06-16 NOTE — Telephone Encounter (Signed)
Yes My recommendation is to proceed with colon at this time

## 2015-06-16 NOTE — Progress Notes (Signed)
No allergies to eggs or soy No diet/weight loss meds No home oxygen No past problems with anesthesia  Has email and internet; refused emmi

## 2015-06-16 NOTE — Telephone Encounter (Signed)
DPatterson's report in 2014 recommended 3 yr f/u.  It's been 2 yrs.  Pt ok with proceeding since you had OV with her.  Just double-checking now is appropriate time for f/u colonoscopy.  Thank you, Caidan Hubbert/PV

## 2015-06-17 ENCOUNTER — Encounter: Payer: Self-pay | Admitting: Internal Medicine

## 2015-06-23 ENCOUNTER — Ambulatory Visit (AMBULATORY_SURGERY_CENTER): Payer: 59 | Admitting: Internal Medicine

## 2015-06-23 ENCOUNTER — Encounter: Payer: Self-pay | Admitting: Internal Medicine

## 2015-06-23 VITALS — BP 106/82 | HR 62 | Temp 97.9°F | Resp 18 | Ht 64.0 in | Wt 117.0 lb

## 2015-06-23 DIAGNOSIS — K5100 Ulcerative (chronic) pancolitis without complications: Secondary | ICD-10-CM | POA: Diagnosis not present

## 2015-06-23 DIAGNOSIS — K5190 Ulcerative colitis, unspecified, without complications: Secondary | ICD-10-CM

## 2015-06-23 DIAGNOSIS — K5150 Left sided colitis without complications: Secondary | ICD-10-CM | POA: Diagnosis not present

## 2015-06-23 DIAGNOSIS — K529 Noninfective gastroenteritis and colitis, unspecified: Secondary | ICD-10-CM | POA: Diagnosis not present

## 2015-06-23 MED ORDER — SODIUM CHLORIDE 0.9 % IV SOLN: 500.0000 mL | INTRAVENOUS | Status: DC

## 2015-06-23 NOTE — Op Note (Signed)
[place]  [address] [place], [id number]   COLONOSCOPY PROCEDURE REPORT  PATIENT: Annette Hopkins, Annette Hopkins  MR#: [id number] BIRTHDATE: [birth year] , 44  yrs. old GENDER: female ENDOSCOPIST: Jerene Bears, MD PROCEDURE DATE:  06/23/2015 PROCEDURE:   Colonoscopy, surveillance and Colonoscopy with biopsy First Screening Colonoscopy - Avg.  risk and is 50 yrs.  old or older - No.  Prior Negative Screening - Now for repeat screening. N/A  History of Adenoma - Now for follow-up colonoscopy & has been > or = to 3 yrs.  N/A  Polyps removed today? No Recommend repeat exam, <10 yrs? Yes high risk ASA CLASS:   Class II INDICATIONS:  history of pan-ulcerative colitis, diagnosis 2003, for surveillance, last colonoscopy 2 yrs ago  . MEDICATIONS: Monitored anesthesia care and Propofol 300 mg IV  DESCRIPTION OF PROCEDURE:   After the risks benefits and alternatives of the procedure were thoroughly explained, informed consent was obtained.  The digital rectal exam revealed no rectal mass.   The LB [id number]  endoscope was introduced through the anus and advanced to the terminal ileum which was intubated for a short distance. No adverse events experienced.   The quality of the prep was excellent.  (Suprep was used)  The instrument was then slowly withdrawn as the colon was fully examined. Estimated blood loss is zero unless otherwise noted in this procedure report.   COLON FINDINGS: The examined terminal ileum appeared to be normal. A normal appearing cecum, ileocecal valve, and appendiceal orifice were identified.  The ascending, transverse, descending, sigmoid colon, appeared unremarkable.   There was very mild erythema in the distal rectum. Multiple surveillance biopsies were performed every 8-10 cm in 4 quadrant fashion using cold forceps.  Retroflexed views revealed small internal hemorrhoids. The time to cecum = 3.9 Withdrawal time = 9.0   The scope was withdrawn and the  procedure completed. COMPLICATIONS: There were no immediate complications.  ENDOSCOPIC IMPRESSION: 1.   The examined terminal ileum appeared to be normal 2.   Normal colonoscopy with very mild rectal erythema; surveillance biopsies were performed using cold forceps  RECOMMENDATIONS: 1.  Await biopsy results 2.  Continue current medications 3.  Timing of repeat colonoscopy will be determined by pathology findings. 4.  You will receive a letter within 1-2 weeks with the results of your biopsy as well as final recommendations.  Please call my office if you have not received a letter after 3 weeks.  eSigned:  Jerene Bears, MD 06/23/2015 10:30 AM   cc: Kathlene November, MD and The Patient

## 2015-06-23 NOTE — Patient Instructions (Addendum)
RESTART XARELTO TONIGHT.  YOU HAD AN ENDOSCOPIC PROCEDURE TODAY AT [facility]:   Refer to the procedure report that was given to you for any specific questions about what was found during the examination.  If the procedure report does not answer your questions, please call your gastroenterologist to clarify.  If you requested that your care partner not be given the details of your procedure findings, then the procedure report has been included in a sealed envelope for you to review at your convenience later.  YOU SHOULD EXPECT: Some feelings of bloating in the abdomen. Passage of more gas than usual.  Walking can help get rid of the air that was put into your GI tract during the procedure and reduce the bloating. If you had a lower endoscopy (such as a colonoscopy or flexible sigmoidoscopy) you may notice spotting of blood in your stool or on the toilet paper. If you underwent a bowel prep for your procedure, you may not have a normal bowel movement for a few days.  Please Note:  You might notice some irritation and congestion in your nose or some drainage.  This is from the oxygen used during your procedure.  There is no need for concern and it should clear up in a day or so.  SYMPTOMS TO REPORT IMMEDIATELY:   Following lower endoscopy (colonoscopy or flexible sigmoidoscopy):  Excessive amounts of blood in the stool  Significant tenderness or worsening of abdominal pains  Swelling of the abdomen that is new, acute  Fever of 100F or higher   For urgent or emergent issues, a gastroenterologist can be reached at any hour by calling [phone].   DIET: Your first meal following the procedure should be a small meal and then it is ok to progress to your normal diet. Heavy or fried foods are harder to digest and may make you feel nauseous or bloated.  Likewise, meals heavy in dairy and vegetables can increase bloating.  Drink plenty of fluids but you should avoid alcoholic  beverages for 24 hours.  ACTIVITY:  You should plan to take it easy for the rest of today and you should NOT DRIVE or use heavy machinery until tomorrow (because of the sedation medicines used during the test).    FOLLOW UP: Our staff will call the number listed on your records the next business day following your procedure to check on you and address any questions or concerns that you may have regarding the information given to you following your procedure. If we do not reach you, we will leave a message.  However, if you are feeling well and you are not experiencing any problems, there is no need to return our call.  We will assume that you have returned to your regular daily activities without incident.  If any biopsies were taken you will be contacted by phone or by letter within the next 1-3 weeks.  Please call us at [phone] if you have not heard about the biopsies in 3 weeks.    SIGNATURES/CONFIDENTIALITY: You and/or your care partner have signed paperwork which will be entered into your electronic medical record.  These signatures attest to the fact that that the information above on your After Visit Summary has been reviewed and is understood.  Full responsibility of the confidentiality of this discharge information lies with you and/or your care-partner.

## 2015-06-23 NOTE — Progress Notes (Signed)
A/ox3, pleased with MAC, report to RN 

## 2015-06-23 NOTE — Progress Notes (Signed)
Called to room to assist during endoscopic procedure.  Patient ID and intended procedure confirmed with present staff. Received instructions for my participation in the procedure from the performing physician.  

## 2015-06-24 ENCOUNTER — Telehealth: Payer: Self-pay

## 2015-06-24 NOTE — Telephone Encounter (Signed)
Left message on answering machine. 

## 2015-06-30 ENCOUNTER — Encounter: Payer: Self-pay | Admitting: Internal Medicine

## 2015-07-14 ENCOUNTER — Other Ambulatory Visit: Payer: Self-pay | Admitting: Hematology & Oncology

## 2015-07-16 ENCOUNTER — Ambulatory Visit (HOSPITAL_BASED_OUTPATIENT_CLINIC_OR_DEPARTMENT_OTHER): Payer: 59 | Admitting: Hematology & Oncology

## 2015-07-16 ENCOUNTER — Encounter: Payer: Self-pay | Admitting: Hematology & Oncology

## 2015-07-16 ENCOUNTER — Other Ambulatory Visit (HOSPITAL_BASED_OUTPATIENT_CLINIC_OR_DEPARTMENT_OTHER): Payer: 59

## 2015-07-16 VITALS — BP 107/62 | HR 62 | Temp 97.2°F | Resp 16 | Ht 64.0 in | Wt 116.0 lb

## 2015-07-16 DIAGNOSIS — Z86718 Personal history of other venous thrombosis and embolism: Secondary | ICD-10-CM

## 2015-07-16 LAB — CBC WITH DIFFERENTIAL (CANCER CENTER ONLY)
BASO#: 0 10*3/uL (ref 0.0–0.2)
BASO%: 0.1 % (ref 0.0–2.0)
EOS%: 1.7 % (ref 0.0–7.0)
Eosinophils Absolute: 0.1 10*3/uL (ref 0.0–0.5)
HCT: 39.9 % (ref 34.8–46.6)
HGB: 13.6 g/dL (ref 11.6–15.9)
LYMPH#: 2.5 10*3/uL (ref 0.9–3.3)
LYMPH%: 33.9 % (ref 14.0–48.0)
MCH: 30 pg (ref 26.0–34.0)
MCHC: 34.1 g/dL (ref 32.0–36.0)
MCV: 88 fL (ref 81–101)
MONO#: 0.7 10*3/uL (ref 0.1–0.9)
MONO%: 9.1 % (ref 0.0–13.0)
NEUT#: 4.1 10*3/uL (ref 1.5–6.5)
NEUT%: 55.2 % (ref 39.6–80.0)
Platelets: 171 10*3/uL (ref 145–400)
RBC: 4.54 10*6/uL (ref 3.70–5.32)
RDW: 12.7 % (ref 11.1–15.7)
WBC: 7.5 10*3/uL (ref 3.9–10.0)

## 2015-07-16 LAB — BASIC METABOLIC PANEL (CC13)
BUN: 12 mg/dL (ref 7–25)
CALCIUM: 9.7 mg/dL (ref 8.6–10.2)
CO2: 28 mmol/L (ref 20–31)
Chloride: 100 mmol/L (ref 98–110)
Creatinine, Ser: 0.76 mg/dL (ref 0.50–1.10)
Glucose, Bld: 88 mg/dL (ref 65–99)
Potassium: 3.7 mmol/L (ref 3.5–5.3)
Sodium: 136 mmol/L (ref 135–146)

## 2015-07-16 NOTE — Progress Notes (Signed)
Hematology and Oncology Follow Up Visit  Annette Hopkins [id number] [birth year] 44 y.o. 07/16/2015   Principle Diagnosis:   Recurrent pulmonary embolism and left leg DVT  Ulcerative colitis  Current Therapy:    Xarelto 20 mg by mouth daily-lifelong     Interim History:  Ms.  Annette Hopkins is back for follow-up. She is doing well. We last saw her I think back in June. At that time we did do a ultrasound of her left leg. Doppler did not show any evidence of thrombus.  She needs lifelong anticoagulation. Identify that she has ulcerative colitis probably is a risk factor for thrombotic episodes.  She is running. She is exercising. She is very active.   She is looking for to Christmas. I think that she and her family are going out of town.   She recently had a colonoscopy done. This was negative. She does not need another colonoscopy for 2 years.   She does have ulcerative colitis but this is not flaring up.   Overall, her performance status is ECOG 0.  Medications:  Current outpatient prescriptions:  .  levonorgestrel (MIRENA) 20 MCG/24HR IUD, 1 each by Intrauterine route once., Disp: , Rfl:  .  mesalamine (LIALDA) 1.2 G EC tablet, Take 1 tablet (1.2 g total) by mouth daily. (Patient taking differently: Take 2.4 g by mouth daily. ), Disp: 60 tablet, Rfl: 0 .  Multiple Vitamin (MULTIVITAMIN) capsule, Take 1 capsule by mouth daily., Disp: , Rfl:  .  XARELTO 20 MG TABS tablet, TAKE 1 TABLET(20 MG) BY MOUTH DAILY WITH DINNER, Disp: 30 tablet, Rfl: 0  Allergies:  Allergies  Allergen Reactions  . Mercaptopurine Nausea And Vomiting  . Sulfa Antibiotics     Past Medical History, Surgical history, Social history, and Family History were reviewed and updated.  Review of Syste Physical Exam:  height is 5' 4"  (1.626 m) and weight is 116 lb (52.617 kg). Her oral temperature is 97.2 F (36.2 C). Her blood pressure is 107/62 and her pulse is 62. Her respiration is 16.   well developed and  well-nourished white female. Head and neck exam shows no ocular or oral lesions. She has a palpable cervical or supraclavicular lymph nodes. Lungs are clear. There are no rales, wheezes or rhonchi. Cardiac exam regular rate and rhythm with no murmurs, rubs or bruits. Abdomen is soft. She has good bowel sounds. There is no fluid wave. There is no palpable liver or spleen tip. Back exam shows no tenderness over the spine, ribs or hips. Extremities shows no clubbing, cyanosis or edema. She has no venous cord in the legs. She is a negative Homans sign. Neurological exam is nonfocal   Lab Results  Component Value Date   WBC 7.5 07/16/2015   HGB 13.6 07/16/2015   HCT 39.9 07/16/2015   MCV 88 07/16/2015   PLT 171 07/16/2015     Chemistry      Component Value Date/Time   NA 135 01/13/2015 1054   K 4.8 01/13/2015 1054   CL 100 01/13/2015 1054   CO2 27 01/13/2015 1054   BUN 10 01/13/2015 1054   CREATININE 0.74 01/13/2015 1054      Component Value Date/Time   CALCIUM 9.5 01/13/2015 1054   ALKPHOS 54 11/01/2013 1456   AST 22 11/01/2013 1456   ALT 18 11/01/2013 1456   BILITOT 0.6 11/01/2013 1456         Impression and Plan: Annette Hopkins is  44 year old white female. She has a history  of thromboembolic disease. She has not had a blood clot probably for 15 years.  We saw her in September 2015.  We got her off anticoagulation. An had her on aspirin.  It is obvious that she is hypercoagulable. Again, all of her studies have come back negative.  He will need lifelong anticoagulation. She is happy being on Xarelto.  I do not think we have to see her back now. Everything is pretty stable. I don't think we are adding much to her medical care right now. I told her that she can always come back at any time if she has any issues.  Annette Napoleon, MD 12/14/20166:06 PM

## 2015-07-20 LAB — PROTEIN S ACTIVITY: PROTEIN S ACTIVITY: 100 % (ref 60–140)

## 2015-07-20 LAB — PROTEIN S, TOTAL: PROTEIN S ANTIGEN, TOTAL: 73 % (ref 70–140)

## 2015-08-24 ENCOUNTER — Other Ambulatory Visit: Payer: Self-pay | Admitting: Hematology & Oncology

## 2015-09-27 ENCOUNTER — Other Ambulatory Visit: Payer: Self-pay | Admitting: Hematology & Oncology

## 2015-10-29 ENCOUNTER — Other Ambulatory Visit: Payer: Self-pay | Admitting: Hematology & Oncology

## 2015-11-26 ENCOUNTER — Other Ambulatory Visit: Payer: Self-pay | Admitting: Hematology & Oncology

## 2015-12-29 ENCOUNTER — Other Ambulatory Visit: Payer: Self-pay | Admitting: Hematology & Oncology

## 2016-01-26 ENCOUNTER — Other Ambulatory Visit: Payer: Self-pay | Admitting: Hematology & Oncology

## 2016-01-26 ENCOUNTER — Telehealth: Payer: Self-pay | Admitting: Internal Medicine

## 2016-01-26 NOTE — Telephone Encounter (Signed)
°  Relationship to patient: Self  Can be reached: [phone]  Reason for call: Patient would like to transfer care to Dr. Lorelei Pont from Dr. Larose Kells

## 2016-01-26 NOTE — Telephone Encounter (Signed)
Spring w/ me , thx

## 2016-01-26 NOTE — Telephone Encounter (Signed)
That is fine 

## 2016-02-06 NOTE — Telephone Encounter (Signed)
Notified patient via My Chart to call and schedule OV to transfer care.

## 2016-02-08 ENCOUNTER — Other Ambulatory Visit: Payer: Self-pay | Admitting: Internal Medicine

## 2016-02-09 ENCOUNTER — Other Ambulatory Visit: Payer: Self-pay | Admitting: Physician Assistant

## 2016-02-09 NOTE — Telephone Encounter (Signed)
Patient scheduled for 03/25/2016

## 2016-02-10 ENCOUNTER — Telehealth: Payer: Self-pay | Admitting: Internal Medicine

## 2016-02-10 ENCOUNTER — Other Ambulatory Visit: Payer: Self-pay | Admitting: Internal Medicine

## 2016-02-10 MED ORDER — MESALAMINE 1.2 G PO TBEC: 2.4000 g | DELAYED_RELEASE_TABLET | Freq: Every day | ORAL | Status: DC

## 2016-02-10 NOTE — Telephone Encounter (Signed)
Ok to refill until followup visit

## 2016-02-10 NOTE — Telephone Encounter (Signed)
Rx sent 

## 2016-02-10 NOTE — Telephone Encounter (Signed)
Dr Hilarie Fredrickson, please advise.... May I give refills on Lialda? Last rx was given 03/2015 with 0 refills. Patient has scheduled an appointment for 04/19/16.

## 2016-02-22 ENCOUNTER — Other Ambulatory Visit: Payer: Self-pay | Admitting: Hematology & Oncology

## 2016-03-24 ENCOUNTER — Other Ambulatory Visit: Payer: Self-pay | Admitting: Hematology & Oncology

## 2016-03-25 ENCOUNTER — Ambulatory Visit (INDEPENDENT_AMBULATORY_CARE_PROVIDER_SITE_OTHER): Payer: 59 | Admitting: Family Medicine

## 2016-03-25 ENCOUNTER — Encounter: Payer: Self-pay | Admitting: Family Medicine

## 2016-03-25 VITALS — BP 100/62 | HR 68 | Temp 97.9°F | Ht 64.0 in | Wt 115.4 lb

## 2016-03-25 DIAGNOSIS — Z85820 Personal history of malignant melanoma of skin: Secondary | ICD-10-CM

## 2016-03-25 DIAGNOSIS — Z0000 Encounter for general adult medical examination without abnormal findings: Secondary | ICD-10-CM | POA: Diagnosis not present

## 2016-03-25 DIAGNOSIS — Z130 Encounter for screening for diseases of the blood and blood-forming organs and certain disorders involving the immune mechanism: Secondary | ICD-10-CM

## 2016-03-25 DIAGNOSIS — Z1329 Encounter for screening for other suspected endocrine disorder: Secondary | ICD-10-CM

## 2016-03-25 DIAGNOSIS — Z13220 Encounter for screening for lipoid disorders: Secondary | ICD-10-CM

## 2016-03-25 DIAGNOSIS — Z131 Encounter for screening for diabetes mellitus: Secondary | ICD-10-CM

## 2016-03-25 NOTE — Progress Notes (Signed)
[facility] [address] [place], [place] [address] [phone]  Date:  03/25/2016   Name:  Annette Hopkins   DOB:  May 16, 1971   MRN:  [id number]  PCP:  Kathlene November, MD    Chief Complaint: Establish Care (Former pt of Dr. Larose Kells. Pt would like CPE today. )   History of Present Illness:  Annette Hopkins is a 45 y.o. very pleasant female patient who presents with the following:  History of UC, coagulation defect resutlting in long term anticoagulation. She is on xarelto which is working well for her.  No abnormal bleeding or bruising that she has noticed She did have melanoma on her led in 2014.  She had surgery but has not followed up with derm She uses Lialda which is helpful for her in controlling her UC  Rheum- she does not see one, her RA has been quiet and not bothersome at this time Hematologist- [facility].  However at her last visit with him in December he indicated that she did not need to continue following up unless she has any change or problem GI- Sharlett Iles She needs to follow-up with dermatology. She would like a referal for same OBG- she does have a new provider and cannot remember their name right now. She uses Mirena IUD.  Pap and mammo are UTD  She teaches kidnergarden.  Her children start next week- she is at [employer] She has 1 daughter- she is 69 yo.  She is doing well, she is a Ship broker at [employer].    She would like to do labs today so we will take care of this.  She ate about 6 hours ago She did have transaminitis in the past but this was due to medications, now resolved   She declines a flu shot today.    Tetanus is UTD She is oveall feeling well Energy level is generally good, she is not losing weight.   No fevers or night sweats.  No cough, vomiting Her bowels are regular She lives in [place]  Patient Active Problem List   Diagnosis Date Noted  . Melanoma ([place]) 02/20/2013  . Headache(784.0)  04/20/2012  . Osteopenia 02/21/2012  . GERD (gastroesophageal reflux disease) 02/17/2011  . UC (ulcerative colitis) ([place]) 02/16/2011  . Coagulation defect ([place]) 02/16/2011  . Abnormal liver function tests 02/16/2011  . Transaminitis 11/11/2010  . Annual physical exam 11/03/2010  . Ulcerative colitis   . History of blood clots   . RA (rheumatoid arthritis) ([place])   . Bilateral polycystic ovarian syndrome 07/31/2010    Past Medical History:  Diagnosis Date  . Abnormal LFTs   . Esophagitis   . History of blood clots     B leg DVT 2005, abdomen. On Coumadin for life  . History of blood transfusion    after reaction to 6-MP  . Long term (current) use of anticoagulants   . Pulmonary embolism ([place])   . RA (rheumatoid arthritis) ([facility])    ~2005  . Ulcerative colitis    dx  around 2004    Past Surgical History:  Procedure Laterality Date  . vascular stents bilateral groins  2006    Social History  Substance Use Topics  . Smoking status: Never Smoker  . Smokeless tobacco: Never Used     Comment: Never Used Tobacco  . Alcohol use 4.2 oz/week    7 Glasses of wine per week    Family  History  Problem Relation Age of Onset  . Colon cancer Paternal Grandmother   . Colon polyps Paternal Grandmother   . Breast cancer Mother   . CAD Neg Hx   . Clotting disorder Neg Hx     Allergies  Allergen Reactions  . Mercaptopurine Nausea And Vomiting  . Sulfa Antibiotics     Medication list has been reviewed and updated.  Current Outpatient Prescriptions on File Prior to Visit  Medication Sig Dispense Refill  . levonorgestrel (MIRENA) 20 MCG/24HR IUD 1 each by Intrauterine route once.    . mesalamine (LIALDA) 1.2 g EC tablet Take 2 tablets (2.4 g total) by mouth daily. 60 tablet 1  . XARELTO 20 MG TABS tablet TAKE 1 TABLET(20 MG) BY MOUTH DAILY WITH DINNER 30 tablet 0   No current facility-administered medications on file prior to visit.     Review of Systems:  As per HPI-  otherwise negative.   Physical Examination: Vitals:   03/25/16 1718  BP: 100/62  Pulse: 68  Temp: 97.9 F (36.6 C)   Vitals:   03/25/16 1718  Weight: 115 lb 6.4 oz (52.3 kg)  Height: 5' 4"  (1.626 m)   Body mass index is 19.81 kg/m. Ideal Body Weight: Weight in (lb) to have BMI = 25: 145.3  GEN: WDWN, NAD, Non-toxic, A & O x 3, thin build, looks well HEENT: Atraumatic, Normocephalic. Neck supple. No masses, No LAD.  Bilateral TM wnl, oropharynx normal.  PEERL,EOMI.   Ears and Nose: No external deformity. CV: RRR, No M/G/R. No JVD. No thrill. No extra heart sounds. PULM: CTA B, no wheezes, crackles, rhonchi. No retractions. No resp. distress. No accessory muscle use. ABD: S, NT, ND. No rebound. No HSM. EXTR: No c/c/e NEURO Normal gait.  PSYCH: Normally interactive. Conversant. Not depressed or anxious appearing.  Calm demeanor.    Assessment and Plan: Physical exam  History of melanoma - Plan: Ambulatory referral to Dermatology  Screening for deficiency anemia - Plan: CBC  Screening for diabetes mellitus - Plan: Comprehensive metabolic panel, Hemoglobin A1c  Screening for thyroid disorder - Plan: TSH  Screening for hyperlipidemia - Plan: Lipid panel  Here today for a physical exam She has a long problem list for someone who is essentially healthy. However all of her chronic conditions are under good control and not causing her any problems at this time Will plan further follow- up pending labs.   Signed Lamar Blinks, MD

## 2016-03-25 NOTE — Progress Notes (Signed)
Pre visit review using our clinic review tool, if applicable. No additional management support is needed unless otherwise documented below in the visit note. 

## 2016-03-25 NOTE — Patient Instructions (Signed)
It was very nice to meet you today- I'll be in touch with your labs asap  We will refer you to dermatology asap for a full skin check.  This is important to do; let me know if you do not hear about your appointment soon  Take care and we can plan to meet for a physical in a year, unless your labs suggest we need to meet sooner.  I am glad to see you for any sick visits or other concerns also

## 2016-03-26 LAB — LIPID PANEL
CHOLESTEROL: 155 mg/dL (ref 0–200)
HDL: 61.3 mg/dL (ref >39.00)
LDL CALC: 76 mg/dL (ref 0–99)
NonHDL: 93.96
TRIGLYCERIDES: 91 mg/dL (ref 0.0–149.0)
Total CHOL/HDL Ratio: 3
VLDL: 18.2 mg/dL (ref 0.0–40.0)

## 2016-03-26 LAB — COMPREHENSIVE METABOLIC PANEL
ALBUMIN: 4.3 g/dL (ref 3.5–5.2)
ALK PHOS: 97 U/L (ref 39–117)
ALT: 35 U/L (ref 0–35)
AST: 25 U/L (ref 0–37)
BILIRUBIN TOTAL: 0.8 mg/dL (ref 0.2–1.2)
BUN: 9 mg/dL (ref 6–23)
CALCIUM: 8.8 mg/dL (ref 8.4–10.5)
CHLORIDE: 102 meq/L (ref 96–112)
CO2: 30 mEq/L (ref 19–32)
CREATININE: 0.74 mg/dL (ref 0.40–1.20)
GFR: 90.81 mL/min (ref >60.00)
Glucose, Bld: 85 mg/dL (ref 70–99)
Potassium: 4 mEq/L (ref 3.5–5.1)
SODIUM: 138 meq/L (ref 135–145)
TOTAL PROTEIN: 7.1 g/dL (ref 6.0–8.3)

## 2016-03-26 LAB — CBC
HCT: 40 % (ref 36.0–46.0)
Hemoglobin: 13.8 g/dL (ref 12.0–15.0)
MCHC: 34.4 g/dL (ref 30.0–36.0)
MCV: 87.4 fl (ref 78.0–100.0)
PLATELETS: 202 10*3/uL (ref 150.0–400.0)
RBC: 4.57 Mil/uL (ref 3.87–5.11)
RDW: 13.3 % (ref 11.5–15.5)
WBC: 6.1 10*3/uL (ref 4.0–10.5)

## 2016-03-26 LAB — HEMOGLOBIN A1C: HEMOGLOBIN A1C: 5.2 % (ref 4.6–6.5)

## 2016-03-26 LAB — TSH: TSH: 1.65 u[IU]/mL (ref 0.35–4.50)

## 2016-04-19 ENCOUNTER — Ambulatory Visit (INDEPENDENT_AMBULATORY_CARE_PROVIDER_SITE_OTHER): Payer: Commercial Managed Care - PPO | Admitting: Internal Medicine

## 2016-04-19 ENCOUNTER — Other Ambulatory Visit: Payer: Self-pay | Admitting: Hematology & Oncology

## 2016-04-19 ENCOUNTER — Encounter: Payer: Self-pay | Admitting: Internal Medicine

## 2016-04-19 VITALS — BP 100/70 | HR 68 | Ht 64.0 in | Wt 115.8 lb

## 2016-04-19 DIAGNOSIS — K5100 Ulcerative (chronic) pancolitis without complications: Secondary | ICD-10-CM

## 2016-04-19 MED ORDER — MESALAMINE 1.2 G PO TBEC: 2.4000 g | DELAYED_RELEASE_TABLET | Freq: Every day | ORAL | 11 refills | Status: DC

## 2016-04-19 NOTE — Patient Instructions (Signed)
We have sent the following medications to your pharmacy for you to pick up at your convenience: Lialda 2.4 grams daily  You will be due for a recall colonoscopy in 06/2017. We will send you a reminder in the mail when it gets closer to that time.  Your colonoscopy will be counted as your follow up.  If you are age 43 or older, your body mass index should be between 23-30. Your Body mass index is 19.88 kg/m. If this is out of the aforementioned range listed, please consider follow up with your Primary Care Provider.  If you are age 67 or younger, your body mass index should be between 19-25. Your Body mass index is 19.88 kg/m. If this is out of the aformentioned range listed, please consider follow up with your Primary Care Provider.

## 2016-04-19 NOTE — Progress Notes (Signed)
Subjective:    Patient ID: Annette Hopkins, female    DOB: February 11, 1971, 45 y.o.   MRN: [id number]  HPI Annette Hopkins is a 45 year old female with history of pan ulcerative colitis diagnosed in 2003, history of DVT and PE on Xarelto, history of RA who is here for follow-up. She was last seen in the office in September 2016 and came for surveillance colonoscopy which was performed on 06/23/2015. She's been maintained on Lialda 2.4 g daily. Her colonoscopy revealed a normal exam with very mild rectal erythema. Surveillance biopsies were performed. Right colon showed mildly active chronic colitis without dysplasia. Left colon biopsy showed chronic inactive colitis. Rectal biopsy showed chronic inactive colitis. There is no dysplasia in any of the surveillance biopsies.  She reports that she been doing very well but had started to run end of Lialda. She reduced the dose to 1.2 g daily and with this noticed some mushy stools without bleeding or abdominal pain. Her energy levels have been good. She denies oral ulcers, rash, new joint pains or ocular complaint.  Appetite has been good. No fever or chills.  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  Review of Systems As per history of present illness, otherwise negative  Current Medications, Allergies, Past Medical History, Past Surgical History, Family History and Social History were reviewed in [employer] record.     Objective:   Physical Exam BP 100/70 (BP Location: Left Arm, Patient Position: Sitting, Cuff Size: Normal)   Pulse 68   Ht 5' 4"  (1.626 m)   Wt 115 lb 12.8 oz (52.5 kg)   BMI 19.88 kg/m  Constitutional: Well-developed and well-nourished. No distress. HEENT: Normocephalic and atraumatic. Oropharynx is clear and moist. No oropharyngeal exudate. Conjunctivae are normal.  No scleral icterus. Neck: Neck supple. Trachea  midline. Cardiovascular: Normal rate, regular rhythm and intact distal pulses. No M/R/G Pulmonary/chest: Effort normal and breath sounds normal. No wheezing, rales or rhonchi. Abdominal: Soft, nontender, nondistended. Bowel sounds active throughout. There are no masses palpable. No hepatosplenomegaly. Extremities: no clubbing, cyanosis, or edema Lymphadenopathy: No cervical adenopathy noted. Neurological: Alert and oriented to person place and time. Skin: Skin is warm and dry. No rashes noted. Psychiatric: Normal mood and affect. Behavior is normal.  CBC    Component Value Date/Time   WBC 6.1 03/25/2016 1750   RBC 4.57 03/25/2016 1750   HGB 13.8 03/25/2016 1750   HGB 13.6 07/16/2015 1523   HGB 14.1 08/02/2011 1207   HCT 40.0 03/25/2016 1750   HCT 39.9 07/16/2015 1523   HCT 41.3 08/02/2011 1207   PLT 202.0 03/25/2016 1750   PLT 171 07/16/2015 1523   PLT 172 08/02/2011 1207   MCV 87.4 03/25/2016 1750   MCV 88 07/16/2015 1523   MCV 90.9 08/02/2011 1207   MCH 30.0 07/16/2015 1523   MCHC 34.4 03/25/2016 1750   RDW 13.3 03/25/2016 1750   RDW 12.7 07/16/2015 1523   RDW 12.7 08/02/2011 1207   LYMPHSABS 2.5 07/16/2015 1523   LYMPHSABS 1.7 08/02/2011 1207   MONOABS 0.5 11/01/2013 1456   MONOABS 0.6 08/02/2011 1207   EOSABS 0.1 07/16/2015 1523   BASOSABS 0.0 07/16/2015 1523   BASOSABS 0.0 08/02/2011 1207   CMP     Component Value Date/Time   NA 138 03/25/2016 1750   K 4.0 03/25/2016 1750   CL 102 03/25/2016 1750   CO2 30  03/25/2016 1750   GLUCOSE 85 03/25/2016 1750   BUN 9 03/25/2016 1750   CREATININE 0.74 03/25/2016 1750   CALCIUM 8.8 03/25/2016 1750   PROT 7.1 03/25/2016 1750   ALBUMIN 4.3 03/25/2016 1750   AST 25 03/25/2016 1750   ALT 35 03/25/2016 1750   ALKPHOS 97 03/25/2016 1750   BILITOT 0.8 03/25/2016 1750       Assessment & Plan:  45 year old female with history of pan ulcerative colitis diagnosed in 2003, history of DVT and PE on Xarelto, history of RA who  is here for follow-up.  1. Pan ulcerative colitis -- minimal histologic activity at time of surveillance colonoscopy last year. She is done well with Lialda though developed some loose stools with lower dose of Lialda. We will refill Lialda and continue 2.4 g daily. She is due surveillance colonoscopy in November 2018 and she is aware of this recommendation. Should she develop signs or symptoms of active or recurrent colitis she's asked to notify me otherwise she can be seen again at the time of her surveillance colonoscopy.  Annual flu vaccine recommended. 15 minutes spent with the patient today. Greater than 50% was spent in counseling and coordination of care with the patient

## 2016-05-23 ENCOUNTER — Other Ambulatory Visit: Payer: Self-pay | Admitting: Hematology & Oncology

## 2016-06-24 ENCOUNTER — Other Ambulatory Visit: Payer: Self-pay | Admitting: Hematology & Oncology

## 2016-07-20 ENCOUNTER — Other Ambulatory Visit: Payer: Self-pay | Admitting: Hematology & Oncology

## 2016-08-12 ENCOUNTER — Telehealth: Payer: Self-pay | Admitting: Internal Medicine

## 2016-08-12 NOTE — Telephone Encounter (Signed)
Ive asked patient to contact her insurance company to find out which mesalamine/balsalazide/sulfasalazine products they prefer to cover this year and let us know. We can possibly choose one of those. She verbalizes understanding.

## 2016-08-19 ENCOUNTER — Other Ambulatory Visit: Payer: Self-pay | Admitting: Hematology & Oncology

## 2016-09-16 ENCOUNTER — Other Ambulatory Visit: Payer: Self-pay | Admitting: Hematology & Oncology

## 2016-10-12 ENCOUNTER — Other Ambulatory Visit: Payer: Self-pay | Admitting: Hematology & Oncology

## 2016-11-17 ENCOUNTER — Other Ambulatory Visit: Payer: Self-pay | Admitting: Hematology & Oncology

## 2016-12-22 ENCOUNTER — Other Ambulatory Visit: Payer: Self-pay | Admitting: Hematology & Oncology

## 2017-01-23 ENCOUNTER — Other Ambulatory Visit: Payer: Self-pay | Admitting: Hematology & Oncology

## 2017-02-03 DIAGNOSIS — Z124 Encounter for screening for malignant neoplasm of cervix: Secondary | ICD-10-CM | POA: Diagnosis not present

## 2017-02-03 DIAGNOSIS — Z1231 Encounter for screening mammogram for malignant neoplasm of breast: Secondary | ICD-10-CM | POA: Diagnosis not present

## 2017-02-03 DIAGNOSIS — R875 Abnormal microbiological findings in specimens from female genital organs: Secondary | ICD-10-CM | POA: Diagnosis not present

## 2017-02-03 DIAGNOSIS — Z01419 Encounter for gynecological examination (general) (routine) without abnormal findings: Secondary | ICD-10-CM | POA: Diagnosis not present

## 2017-02-03 LAB — HM MAMMOGRAPHY

## 2017-02-03 LAB — HM PAP SMEAR

## 2017-02-09 ENCOUNTER — Encounter: Payer: Self-pay | Admitting: Internal Medicine

## 2017-02-09 NOTE — Progress Notes (Addendum)
[place] [facility] [address] [place], [place] [address] [phone] [phone]  Date:  02/10/2017   Name:  Annette Hopkins   DOB:  1970/08/03   MRN:  [id number]  PCP:  Colon Branch, MD    Chief Complaint: Annual Exam (Last PAP: 02/03/2017. )   History of Present Illness:  Annette Hopkins is a 46 y.o. very pleasant female patient who presents with the following:  Here today for a CPE Last seen by myself in August of 2017:  History of UC, coagulation defect resutlting in long term anticoagulation. She is on xarelto which is working well for her.  No abnormal bleeding or bruising that she has noticed She did have melanoma on her leg in 2014.  She had surgery but has not followed up with derm She uses Lialda which is helpful for her in controlling her UC  Rheum- she does not see one, her RA has been quiet and not bothersome at this time Hematologist- [facility].  However at her last visit with him in December he indicated that she did not need to continue following up unless she has any change or problem GI- Sharlett Iles She needs to follow-up with dermatology. She would like a referal for same OBG- she does have a new provider and cannot remember their name right now. She uses Mirena IUD.  Pap and mammo are UTD  She teaches kidnergarden.  Her children start next week- she is at [employer] She has 1 daughter- she is 47 yo.  She is doing well, she is a Ship broker at [employer].    She would like to do labs today so we will take care of this.  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  Her daughter  will be in 6th grade.    She is still on xarelto.  Not having any issues with this medication- I will refill for her today She is on lialda for her UC-  She did see dermatology- all ok.  They want to check her once a year- she would like to change to [facility] however as she did not feel very comfortable with the person who she saw  She is a never smoker, light drinker She is trying to get more exercise and has taken up kayaking and sailing a sun[place] Patient Active Problem List   Diagnosis Date Noted  . Melanoma ([place]) 02/20/2013  . Headache(784.0) 04/20/2012  . Osteopenia 02/21/2012  . GERD (gastroesophageal reflux disease) 02/17/2011  . Coagulation defect ([place]) 02/16/2011  . Transaminitis 11/11/2010  . Ulcerative colitis   . History of blood clots   . RA (rheumatoid arthritis) ([place])   . Polycystic ovaries 07/31/2010    Past Medical History:  Diagnosis Date  . Abnormal LFTs   . Esophagitis   . History of blood clots     B leg DVT 2005, abdomen. On Coumadin for life  . History of blood transfusion    after reaction to 6-MP  .  Long term (current) use of anticoagulants   . Pulmonary embolism ([place])   . RA (rheumatoid arthritis) ([facility])    ~2005  . Ulcerative colitis    dx  around 2004    Past Surgical History:  Procedure Laterality Date  . vascular stents bilateral groins  2006    Social History  Substance Use Topics  . Smoking status: Never Smoker  . Smokeless tobacco: Never Used     Comment: Never Used Tobacco  . Alcohol use 4.2 oz/week    7 Glasses of wine per week    Family History  Problem Relation Age of Onset  . Colon cancer Paternal Grandmother   . Colon polyps Paternal Grandmother   . Breast cancer Mother   . CAD Neg Hx   . Clotting disorder Neg Hx     Allergies  Allergen Reactions  . Mercaptopurine Nausea And Vomiting  . Sulfa Antibiotics Hives    Medication list has been reviewed and updated.  Current Outpatient Prescriptions on  File Prior to Visit  Medication Sig Dispense Refill  . levonorgestrel (MIRENA) 20 MCG/24HR IUD 1 each by Intrauterine route once.    . mesalamine (LIALDA) 1.2 g EC tablet Take 2 tablets (2.4 g total) by mouth daily. 60 tablet 11   No current facility-administered medications on file prior to visit.     Review of Systems:  As per HPI- otherwise negative.  No CP or SOB with exercise No concerns about her skin No fever, chills, ST or cough    Physical Examination: Vitals:   02/10/17 1021  BP: 92/62  Pulse: 82  Temp: 97.9 F (36.6 C)   Vitals:   02/10/17 1021  Weight: 118 lb 3.2 oz (53.6 kg)  Height: 5' 4"  (1.626 m)   Body mass index is 20.29 kg/m. Ideal Body Weight: Weight in (lb) to have BMI = 25: 145.3  GEN: WDWN, NAD, Non-toxic, A & O x 3, slim/normal weight, looks well HEENT: Atraumatic, Normocephalic. Neck supple. No masses, No LAD.  Bilateral TM wnl, oropharynx normal.  PEERL,EOMI.   Ears and Nose: No external deformity. CV: RRR, No M/G/R. No JVD. No thrill. No extra heart sounds. PULM: CTA B, no wheezes, crackles, rhonchi. No retractions. No resp. distress. No accessory muscle use. ABD: S, NT, ND, +BS. No rebound. No HSM. EXTR: No c/c/e NEURO Normal gait.  PSYCH: Normally interactive. Conversant. Not depressed or anxious appearing.  Calm demeanor.   BP Readings from Last 3 Encounters:  02/10/17 92/62  04/19/16 100/70  03/25/16 100/62     Assessment and Plan: Physical exam  Screening for deficiency anemia - Plan: CBC  Screening for diabetes mellitus - Plan: Comprehensive metabolic panel  Screening for hyperlipidemia - Plan: Lipid panel  History of melanoma - Plan: Ambulatory referral to Dermatology  Coagulation defect ([place]) - Plan: rivaroxaban (XARELTO) 20 MG TABS tablet  Ulcerative colitis without complications, unspecified location ([place])  History of a few medical issues as above, but overall feeling very well today Labs pending Referral to [facility]  derm Refilled her xarelto Will plan further follow- up pending labs.   Signed Lamar Blinks, MD  Results for orders placed or performed in visit on 02/10/17  CBC  Result Value Ref Range   WBC 5.9 4.0 - 10.5 K/uL   RBC 4.51 3.87 - 5.11 Mil/uL   Platelets 199.0 150.0 - 400.0 K/uL   Hemoglobin 13.5 12.0 - 15.0 g/dL   HCT 40.1 36.0 - 46.0 %   MCV 88.9  78.0 - 100.0 fl   MCHC 33.8 30.0 - 36.0 g/dL   RDW 12.8 11.5 - 15.5 %  Comprehensive metabolic panel  Result Value Ref Range   Sodium 138 135 - 145 mEq/L   Potassium 4.3 3.5 - 5.1 mEq/L   Chloride 104 96 - 112 mEq/L   CO2 29 19 - 32 mEq/L   Glucose, Bld 91 70 - 99 mg/dL   BUN 9 6 - 23 mg/dL   Creatinine, Ser 0.74 0.40 - 1.20 mg/dL   Total Bilirubin 0.7 0.2 - 1.2 mg/dL   Alkaline Phosphatase 51 39 - 117 U/L   AST 18 0 - 37 U/L   ALT 19 0 - 35 U/L   Total Protein 6.5 6.0 - 8.3 g/dL   Albumin 4.0 3.5 - 5.2 g/dL   Calcium 9.0 8.4 - 10.5 mg/dL   GFR 90.45 >60.00 mL/min  Lipid panel  Result Value Ref Range   Cholesterol 155 0 - 200 mg/dL   Triglycerides 74.0 0.0 - 149.0 mg/dL   HDL 64.40 >39.00 mg/dL   VLDL 14.8 0.0 - 40.0 mg/dL   LDL Cholesterol 76 0 - 99 mg/dL   Total CHOL/HDL Ratio 2    NonHDL 90.49    Message to pt

## 2017-02-10 ENCOUNTER — Encounter: Payer: Self-pay | Admitting: Family Medicine

## 2017-02-10 ENCOUNTER — Ambulatory Visit (INDEPENDENT_AMBULATORY_CARE_PROVIDER_SITE_OTHER): Payer: Commercial Managed Care - PPO | Admitting: Family Medicine

## 2017-02-10 ENCOUNTER — Encounter: Payer: Self-pay | Admitting: Internal Medicine

## 2017-02-10 VITALS — BP 92/62 | HR 82 | Temp 97.9°F | Ht 64.0 in | Wt 118.2 lb

## 2017-02-10 DIAGNOSIS — Z13220 Encounter for screening for lipoid disorders: Secondary | ICD-10-CM | POA: Diagnosis not present

## 2017-02-10 DIAGNOSIS — Z130 Encounter for screening for diseases of the blood and blood-forming organs and certain disorders involving the immune mechanism: Secondary | ICD-10-CM | POA: Diagnosis not present

## 2017-02-10 DIAGNOSIS — K5190 Ulcerative colitis, unspecified, without complications: Secondary | ICD-10-CM | POA: Diagnosis not present

## 2017-02-10 DIAGNOSIS — Z131 Encounter for screening for diabetes mellitus: Secondary | ICD-10-CM | POA: Diagnosis not present

## 2017-02-10 DIAGNOSIS — Z85820 Personal history of malignant melanoma of skin: Secondary | ICD-10-CM

## 2017-02-10 DIAGNOSIS — D689 Coagulation defect, unspecified: Secondary | ICD-10-CM

## 2017-02-10 DIAGNOSIS — Z0000 Encounter for general adult medical examination without abnormal findings: Secondary | ICD-10-CM

## 2017-02-10 LAB — CBC
HEMATOCRIT: 40.1 % (ref 36.0–46.0)
HEMOGLOBIN: 13.5 g/dL (ref 12.0–15.0)
MCHC: 33.8 g/dL (ref 30.0–36.0)
MCV: 88.9 fl (ref 78.0–100.0)
Platelets: 199 10*3/uL (ref 150.0–400.0)
RBC: 4.51 Mil/uL (ref 3.87–5.11)
RDW: 12.8 % (ref 11.5–15.5)
WBC: 5.9 10*3/uL (ref 4.0–10.5)

## 2017-02-10 LAB — LIPID PANEL
CHOLESTEROL: 155 mg/dL (ref 0–200)
HDL: 64.4 mg/dL (ref >39.00)
LDL Cholesterol: 76 mg/dL (ref 0–99)
NonHDL: 90.49
TRIGLYCERIDES: 74 mg/dL (ref 0.0–149.0)
Total CHOL/HDL Ratio: 2
VLDL: 14.8 mg/dL (ref 0.0–40.0)

## 2017-02-10 LAB — COMPREHENSIVE METABOLIC PANEL
ALBUMIN: 4 g/dL (ref 3.5–5.2)
ALK PHOS: 51 U/L (ref 39–117)
ALT: 19 U/L (ref 0–35)
AST: 18 U/L (ref 0–37)
BUN: 9 mg/dL (ref 6–23)
CO2: 29 mEq/L (ref 19–32)
Calcium: 9 mg/dL (ref 8.4–10.5)
Chloride: 104 mEq/L (ref 96–112)
Creatinine, Ser: 0.74 mg/dL (ref 0.40–1.20)
GFR: 90.45 mL/min (ref >60.00)
Glucose, Bld: 91 mg/dL (ref 70–99)
POTASSIUM: 4.3 meq/L (ref 3.5–5.1)
Sodium: 138 mEq/L (ref 135–145)
TOTAL PROTEIN: 6.5 g/dL (ref 6.0–8.3)
Total Bilirubin: 0.7 mg/dL (ref 0.2–1.2)

## 2017-02-10 MED ORDER — RIVAROXABAN 20 MG PO TABS: ORAL_TABLET | ORAL | 3 refills | Status: DC

## 2017-02-10 NOTE — Patient Instructions (Signed)
It was a pleasure to see you today!  Take care and enjoy the rest of your summer vacation I will be in touch with your labs asap We refilled your xarelto today- let me know if any concerns about bleeding/ bruising  Health Maintenance, Female Adopting a healthy lifestyle and getting preventive care can go a long way to promote health and wellness. Talk with your health care provider about what schedule of regular examinations is right for you. This is a good chance for you to check in with your provider about disease prevention and staying healthy. In between checkups, there are plenty of things you can do on your own. Experts have done a lot of research about which lifestyle changes and preventive measures are most likely to keep you healthy. Ask your health care provider for more information. Weight and diet Eat a healthy diet  Be sure to include plenty of vegetables, fruits, low-fat dairy products, and lean protein.  Do not eat a lot of foods high in solid fats, added sugars, or salt.  Get regular exercise. This is one of the most important things you can do for your health. ? Most adults should exercise for at least 150 minutes each week. The exercise should increase your heart rate and make you sweat (moderate-intensity exercise). ? Most adults should also do strengthening exercises at least twice a week. This is in addition to the moderate-intensity exercise.  Maintain a healthy weight  Body mass index (BMI) is a measurement that can be used to identify possible weight problems. It estimates body fat based on height and weight. Your health care provider can help determine your BMI and help you achieve or maintain a healthy weight.  For females 18 years of age and older: ? A BMI below 18.5 is considered underweight. ? A BMI of 18.5 to 24.9 is normal. ? A BMI of 25 to 29.9 is considered overweight. ? A BMI of 30 and above is considered obese.  Watch levels of cholesterol and blood  lipids  You should start having your blood tested for lipids and cholesterol at 44 years of age, then have this test every 5 years.  You may need to have your cholesterol levels checked more often if: ? Your lipid or cholesterol levels are high. ? You are older than 44 years of age. ? You are at high risk for heart disease.  Cancer screening Lung Cancer  Lung cancer screening is recommended for adults 21-24 years old who are at high risk for lung cancer because of a history of smoking.  A yearly low-dose CT scan of the lungs is recommended for people who: ? Currently smoke. ? Have quit within the past 15 years. ? Have at least a 30-pack-year history of smoking. A pack year is smoking an average of one pack of cigarettes a day for 1 year.  Yearly screening should continue until it has been 15 years since you quit.  Yearly screening should stop if you develop a health problem that would prevent you from having lung cancer treatment.  Breast Cancer  Practice breast self-awareness. This means understanding how your breasts normally appear and feel.  It also means doing regular breast self-exams. Let your health care provider know about any changes, no matter how small.  If you are in your 20s or 30s, you should have a clinical breast exam (CBE) by a health care provider every 1-3 years as part of a regular health exam.  If you are  12 or older, have a CBE every year. Also consider having a breast X-ray (mammogram) every year.  If you have a family history of breast cancer, talk to your health care provider about genetic screening.  If you are at high risk for breast cancer, talk to your health care provider about having an MRI and a mammogram every year.  Breast cancer gene (BRCA) assessment is recommended for women who have family members with BRCA-related cancers. BRCA-related cancers include: ? Breast. ? Ovarian. ? Tubal. ? Peritoneal cancers.  Results of the assessment will  determine the need for genetic counseling and BRCA1 and BRCA2 testing.  Cervical Cancer Your health care provider may recommend that you be screened regularly for cancer of the pelvic organs (ovaries, uterus, and vagina). This screening involves a pelvic examination, including checking for microscopic changes to the surface of your cervix (Pap test). You may be encouraged to have this screening done every 3 years, beginning at age 85.  For women ages 47-65, health care providers may recommend pelvic exams and Pap testing every 3 years, or they may recommend the Pap and pelvic exam, combined with testing for human papilloma virus (HPV), every 5 years. Some types of HPV increase your risk of cervical cancer. Testing for HPV may also be done on women of any age with unclear Pap test results.  Other health care providers may not recommend any screening for nonpregnant women who are considered low risk for pelvic cancer and who do not have symptoms. Ask your health care provider if a screening pelvic exam is right for you.  If you have had past treatment for cervical cancer or a condition that could lead to cancer, you need Pap tests and screening for cancer for at least 20 years after your treatment. If Pap tests have been discontinued, your risk factors (such as having a new sexual partner) need to be reassessed to determine if screening should resume. Some women have medical problems that increase the chance of getting cervical cancer. In these cases, your health care provider may recommend more frequent screening and Pap tests.  Colorectal Cancer  This type of cancer can be detected and often prevented.  Routine colorectal cancer screening usually begins at 44 years of age and continues through 44 years of age.  Your health care provider may recommend screening at an earlier age if you have risk factors for colon cancer.  Your health care provider may also recommend using home test kits to check  for hidden blood in the stool.  A small camera at the end of a tube can be used to examine your colon directly (sigmoidoscopy or colonoscopy). This is done to check for the earliest forms of colorectal cancer.  Routine screening usually begins at age 67.  Direct examination of the colon should be repeated every 5-10 years through 44 years of age. However, you may need to be screened more often if early forms of precancerous polyps or small growths are found.  Skin Cancer  Check your skin from head to toe regularly.  Tell your health care provider about any new moles or changes in moles, especially if there is a change in a mole's shape or color.  Also tell your health care provider if you have a mole that is larger than the size of a pencil eraser.  Always use sunscreen. Apply sunscreen liberally and repeatedly throughout the day.  Protect yourself by wearing long sleeves, pants, a wide-brimmed hat, and sunglasses whenever you  are outside.  Heart disease, diabetes, and high blood pressure  High blood pressure causes heart disease and increases the risk of stroke. High blood pressure is more likely to develop in: ? People who have blood pressure in the high end of the normal range (130-139/85-89 mm Hg). ? People who are overweight or obese. ? People who are African American.  If you are 77-66 years of age, have your blood pressure checked every 3-5 years. If you are 46 years of age or older, have your blood pressure checked every year. You should have your blood pressure measured twice-once when you are at a hospital or clinic, and once when you are not at a hospital or clinic. Record the average of the two measurements. To check your blood pressure when you are not at a hospital or clinic, you can use: ? An automated blood pressure machine at a pharmacy. ? A home blood pressure monitor.  If you are between 37 years and 59 years old, ask your health care provider if you should take  aspirin to prevent strokes.  Have regular diabetes screenings. This involves taking a blood sample to check your fasting blood sugar level. ? If you are at a normal weight and have a low risk for diabetes, have this test once every three years after 44 years of age. ? If you are overweight and have a high risk for diabetes, consider being tested at a younger age or more often. Preventing infection Hepatitis B  If you have a higher risk for hepatitis B, you should be screened for this virus. You are considered at high risk for hepatitis B if: ? You were born in a country where hepatitis B is common. Ask your health care provider which countries are considered high risk. ? Your parents were born in a high-risk country, and you have not been immunized against hepatitis B (hepatitis B vaccine). ? You have HIV or AIDS. ? You use needles to inject street drugs. ? You live with someone who has hepatitis B. ? You have had sex with someone who has hepatitis B. ? You get hemodialysis treatment. ? You take certain medicines for conditions, including cancer, organ transplantation, and autoimmune conditions.  Hepatitis C  Blood testing is recommended for: ? Everyone born from [birth year] through 1963. ? Anyone with known risk factors for hepatitis C.  Sexually transmitted infections (STIs)  You should be screened for sexually transmitted infections (STIs) including gonorrhea and chlamydia if: ? You are sexually active and are younger than 44 years of age. ? You are older than 44 years of age and your health care provider tells you that you are at risk for this type of infection. ? Your sexual activity has changed since you were last screened and you are at an increased risk for chlamydia or gonorrhea. Ask your health care provider if you are at risk.  If you do not have HIV, but are at risk, it may be recommended that you take a prescription medicine daily to prevent HIV infection. This is called  pre-exposure prophylaxis (PrEP). You are considered at risk if: ? You are sexually active and do not regularly use condoms or know the HIV status of your partner(s). ? You take drugs by injection. ? You are sexually active with a partner who has HIV.  Talk with your health care provider about whether you are at high risk of being infected with HIV. If you choose to begin PrEP, you should first be  tested for HIV. You should then be tested every 3 months for as long as you are taking PrEP. Pregnancy  If you are premenopausal and you may become pregnant, ask your health care provider about preconception counseling.  If you may become pregnant, take 400 to 800 micrograms (mcg) of folic acid every day.  If you want to prevent pregnancy, talk to your health care provider about birth control (contraception). Osteoporosis and menopause  Osteoporosis is a disease in which the bones lose minerals and strength with aging. This can result in serious bone fractures. Your risk for osteoporosis can be identified using a bone density scan.  If you are 65 years of age or older, or if you are at risk for osteoporosis and fractures, ask your health care provider if you should be screened.  Ask your health care provider whether you should take a calcium or vitamin D supplement to lower your risk for osteoporosis.  Menopause may have certain physical symptoms and risks.  Hormone replacement therapy may reduce some of these symptoms and risks. Talk to your health care provider about whether hormone replacement therapy is right for you. Follow these instructions at home:  Schedule regular health, dental, and eye exams.  Stay current with your immunizations.  Do not use any tobacco products including cigarettes, chewing tobacco, or electronic cigarettes.  If you are pregnant, do not drink alcohol.  If you are breastfeeding, limit how much and how often you drink alcohol.  Limit alcohol intake to no more  than 1 drink per day for nonpregnant women. One drink equals 12 ounces of beer, 5 ounces of wine, or 1 ounces of hard liquor.  Do not use street drugs.  Do not share needles.  Ask your health care provider for help if you need support or information about quitting drugs.  Tell your health care provider if you often feel depressed.  Tell your health care provider if you have ever been abused or do not feel safe at home. This information is not intended to replace advice given to you by your health care provider. Make sure you discuss any questions you have with your health care provider. Document Released: 02/01/2011 Document Revised: 12/25/2015 Document Reviewed: 04/22/2015 Elsevier Interactive Patient Education  2018 Elsevier Inc.  

## 2017-02-25 DIAGNOSIS — D2361 Other benign neoplasm of skin of right upper limb, including shoulder: Secondary | ICD-10-CM | POA: Diagnosis not present

## 2017-02-25 DIAGNOSIS — L821 Other seborrheic keratosis: Secondary | ICD-10-CM | POA: Diagnosis not present

## 2017-02-25 DIAGNOSIS — D1801 Hemangioma of skin and subcutaneous tissue: Secondary | ICD-10-CM | POA: Diagnosis not present

## 2017-04-11 ENCOUNTER — Other Ambulatory Visit: Payer: Self-pay

## 2017-04-11 DIAGNOSIS — Z13820 Encounter for screening for osteoporosis: Secondary | ICD-10-CM

## 2017-04-12 ENCOUNTER — Telehealth: Payer: Self-pay

## 2017-04-12 NOTE — Telephone Encounter (Signed)
-----   Message from [name], RN sent at 04/30/2015  3:58 PM EDT ----- Regarding: Bone Density Needs one in 2 years

## 2017-04-12 NOTE — Telephone Encounter (Signed)
Spoke with pt and she is aware.

## 2017-04-12 NOTE — Telephone Encounter (Signed)
Pt scheduled for bone density scan at [facility] 04/18/17@3 :30pm. Left message for pt to call back.

## 2017-04-18 ENCOUNTER — Inpatient Hospital Stay: Admission: RE | Admit: 2017-04-18 | Payer: Commercial Managed Care - PPO | Source: Ambulatory Visit

## 2017-05-07 ENCOUNTER — Other Ambulatory Visit: Payer: Self-pay | Admitting: Internal Medicine

## 2017-05-16 ENCOUNTER — Ambulatory Visit (INDEPENDENT_AMBULATORY_CARE_PROVIDER_SITE_OTHER)
Admission: RE | Admit: 2017-05-16 | Discharge: 2017-05-16 | Disposition: A | Discharge status: Home / Self Care | Payer: Commercial Managed Care - PPO | Source: Ambulatory Visit | Attending: Internal Medicine | Admitting: Internal Medicine

## 2017-05-16 DIAGNOSIS — Z13820 Encounter for screening for osteoporosis: Secondary | ICD-10-CM | POA: Diagnosis not present

## 2017-06-11 ENCOUNTER — Other Ambulatory Visit: Payer: Self-pay | Admitting: Internal Medicine

## 2017-07-04 ENCOUNTER — Encounter: Payer: Self-pay | Admitting: Internal Medicine

## 2017-07-21 ENCOUNTER — Other Ambulatory Visit: Payer: Self-pay | Admitting: Internal Medicine

## 2017-09-04 ENCOUNTER — Other Ambulatory Visit: Payer: Self-pay | Admitting: Internal Medicine

## 2018-01-23 ENCOUNTER — Ambulatory Visit: Payer: Commercial Managed Care - PPO | Admitting: Physician Assistant

## 2018-01-25 ENCOUNTER — Telehealth: Payer: Self-pay | Admitting: Family Medicine

## 2018-01-25 ENCOUNTER — Telehealth: Payer: Self-pay | Admitting: Internal Medicine

## 2018-01-25 DIAGNOSIS — D689 Coagulation defect, unspecified: Secondary | ICD-10-CM

## 2018-01-25 MED ORDER — MESALAMINE 1.2 G PO TBEC: DELAYED_RELEASE_TABLET | ORAL | 0 refills | Status: DC

## 2018-01-25 NOTE — Telephone Encounter (Signed)
Sent 1 more month of Lialda to patient's pharmacy.

## 2018-01-25 NOTE — Telephone Encounter (Signed)
Copied from [employer] [id number]. Topic: Quick Communication - See Telephone Encounter >> Jan 25, 2018 10:18 AM Conception Chancy, NT wrote: CRM for notification. See Telephone encounter for: 01/25/18.  Patient is calling and states that her father passed away and 03-06-18 she will be traveling to [place] where he lived for a little while. She is going to need a refill on rivaroxaban (XARELTO) 20 MG TABS tablet to take with her. She understands that it is too early to be filled but she will not be here for her physical appointment on 02/15/18 to get further refills. Please advise.   [facility] - [place], [place] - [address] AT [place] [address] [place] [address] Phone: [phone] Fax: [phone]

## 2018-01-25 NOTE — Telephone Encounter (Signed)
Patient requesting a refill of medication lialda sent to [facility] in [facility]. Patient scheduled with Dr.Pyrtle for 9.4.19 since pt is having to go to [place] due to father passing.

## 2018-01-26 NOTE — Telephone Encounter (Signed)
LOV: 02/10/17 with Dr. Lorelei Pont

## 2018-01-27 ENCOUNTER — Encounter: Payer: Self-pay | Admitting: Family Medicine

## 2018-01-27 MED ORDER — RIVAROXABAN 20 MG PO TABS: ORAL_TABLET | ORAL | 0 refills | Status: DC

## 2018-02-01 ENCOUNTER — Ambulatory Visit: Payer: Commercial Managed Care - PPO | Admitting: Nurse Practitioner

## 2018-02-15 ENCOUNTER — Encounter: Payer: Commercial Managed Care - PPO | Admitting: Family Medicine

## 2018-04-05 ENCOUNTER — Ambulatory Visit: Payer: Commercial Managed Care - PPO | Admitting: Internal Medicine

## 2018-05-03 ENCOUNTER — Other Ambulatory Visit: Payer: Self-pay | Admitting: Family Medicine

## 2018-05-03 DIAGNOSIS — D689 Coagulation defect, unspecified: Secondary | ICD-10-CM

## 2018-05-05 ENCOUNTER — Encounter: Payer: Self-pay | Admitting: Family Medicine

## 2018-05-05 DIAGNOSIS — D2239 Melanocytic nevi of other parts of face: Secondary | ICD-10-CM | POA: Diagnosis not present

## 2018-05-05 DIAGNOSIS — Z85828 Personal history of other malignant neoplasm of skin: Secondary | ICD-10-CM | POA: Diagnosis not present

## 2018-05-05 DIAGNOSIS — Z85820 Personal history of malignant melanoma of skin: Secondary | ICD-10-CM | POA: Diagnosis not present

## 2018-05-30 NOTE — Progress Notes (Addendum)
[facility] [place], [place], [place] [address] [phone]  Date:  05/31/2018   Name:  Annette Hopkins   DOB:  Apr 23, 1971   MRN:  [id number]  PCP:  Annette Mclean, MD    Chief Complaint: Annual Exam (flu shot, sees gyn for pap) and Rash (around torso,3 weeks,redness, itching-taken prednisone for 5 days-helped some but still there)   History of Present Illness:  Annette Hopkins is a 47 y.o. 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 The cause is not known.  They cannot think of anything that provoked this clot. He was in [place] at the time as her parents are [place], have lived overseas for many years.  Her mother is doing ok, will be moving in with Annette Hopkins for a while so she can plan her next step   About 3 weeks ago she developed a rash - she used the teledoc service and was given prednisone which she is just finishing up  She has also been taking zyrtec.  This did seem to help but the rash is still present.  It will be very itchy if she gets hot - otherwise is not that bothersome  Does seem to be fading but not resolved yet No one else at home has similar sx   Last seen by myself in July of 2018: She is enjoying her summer break- she is moving to teaching 2nd grade next year.  Her daughter will be in 6th grade.   She is still on xarelto.  Not having any issues with this medication- I will refill for her today She is on lialda for her UC-  She did see dermatology- all ok.  They want to check her once a year- she would like to change to [facility] however as she did not feel very comfortable with the person who she saw She is a never smoker, light  drinker She is trying to get more exercise and has taken up kayaking and sailing a sun[place] in a local [place]  Pap: per GYN Mammo: 7/18, due Labs: a year ago, due Immun: flu today   It looks like she last saw GI in 9/17, Annette Hopkins: 1. Pan ulcerative colitis -- minimal histologic activity at time of surveillance colonoscopy last year. She is done well with Lialda though developed some loose stools with lower dose of Lialda. We will refill Lialda and continue 2.4 g daily. She is due surveillance colonoscopy in November 2018 and she is aware of this recommendation. Should she develop signs or symptoms of active or recurrent colitis she's asked to notify me otherwise she can be seen again at the time of her surveillance colonoscopy. She is due for colonoscopy She is seeing Annette Hopkins this afternoon ------------------------ Last visit with hematology in 2016- at that time Annette Hopkins recommended lifelong anticoagulation but did not feel that she needed to continue seeing hematology as long as stable: Principle Diagnosis:   Recurrent pulmonary embolism and left leg DVT  Ulcerative colitis Current Therapy:         Xarelto 20 mg by mouth daily-lifelong  Interim History:  Ms.  Hopkins is back for follow-up. She is doing well. We last saw her I think back in June. At that time we did do a ultrasound of her left leg. Doppler did not show any evidence of thrombus. She needs lifelong anticoagulation. Identify that she has ulcerative colitis probably is a risk factor for thrombotic episodes.  Dermatology: Annette Hopkins , pt of Annette Hopkins.  Rheumatology:  None currently  She notes past history of low bone mass related to chronic prednisone use -  She did have a dexa a year ago which was ok: Assessment: the BMD is normal according to the [facility] classification for osteoporosis (see below).  Fracture risk: low FRAX score: not calculated due to normal BMD Comments: the technical  quality of the study is good Recommend optimizing calcium (1200 mg/day) and vitamin D (800 IU/day) intake. No pharmacological treatment is indicated. Followup: Repeat BMD is appropriate after 2 years. Patient Active Problem List   Diagnosis Date Noted  . Melanoma ([facility] [place]) 02/20/2013  . Headache(784.0) 04/20/2012  . Osteopenia 02/21/2012  . GERD (gastroesophageal reflux disease) 02/17/2011  . Coagulation defect ([place]) 02/16/2011  . Transaminitis 11/11/2010  . Ulcerative colitis   . History of blood clots   . RA (rheumatoid arthritis) ([place])   . Polycystic ovaries 07/31/2010    Past Medical History:  Diagnosis Date  . Abnormal LFTs   . Esophagitis   . History of blood clots     B leg DVT 2005, abdomen. On Coumadin for life  . History of blood transfusion    after reaction to 6-MP  . Long term (current) use of anticoagulants   . Pulmonary embolism ([place])   . RA (rheumatoid arthritis) ([facility])    ~2005  . Ulcerative colitis    dx  around 2004    Past Surgical History:  Procedure Laterality Date  . vascular stents bilateral groins  2006    Social History   Tobacco Use  . Smoking status: Never Smoker  . Smokeless tobacco: Never Used  . Tobacco comment: Never Used Tobacco  Substance Use Topics  . Alcohol use: Yes    Alcohol/week: 7.0 standard drinks    Types: 7 Glasses of wine per week  . Drug use: No    Family History  Problem Relation Age of Onset  . Colon cancer Paternal Grandmother   . Colon polyps Paternal Grandmother   . Breast cancer Mother   . CAD Neg Hx   . Clotting disorder Neg Hx     Allergies  Allergen Reactions  . Mercaptopurine Nausea And Vomiting  . Sulfa Antibiotics Hives    Medication list has been reviewed and updated.  Current Outpatient Medications on File Prior to Visit  Medication Sig Dispense Refill  . levonorgestrel (MIRENA) 20 MCG/24HR IUD 1 each by Intrauterine route once.    . mesalamine (LIALDA) 1.2 g EC tablet TAKE 2 TABLETS(2.4  GRAMS) BY MOUTH DAILY 60 tablet 0  . XARELTO 20 MG TABS tablet TAKE 1 TABLET(20 MG) BY MOUTH DAILY WITH DINNER 30 tablet 1   No current facility-administered medications on file prior to visit.     Review of Systems:  As per HPI- otherwise negative. No CP or SOB She is not taking lialda that much as her sx are have been quet   Physical Examination: Vitals:   05/31/18 1024  BP: 102/60  Pulse: 96  Resp: 16  Temp: 97.7 F (36.5 C)  SpO2: 97%   Vitals:  05/31/18 1024  Weight: 118 lb (53.5 kg)  Height: 5' 4"  (1.626 m)   Body mass index is 20.25 kg/m. Ideal Body Weight: Weight in (lb) to have BMI = 25: 145.3  GEN: WDWN, NAD, Non-toxic, A & O x 3, looks well, slim build  HEENT: Atraumatic, Normocephalic. Neck supple. No masses, No LAD.  Bilateral TM wnl, oropharynx normal.  PEERL,EOMI.   Ears and Nose: No external deformity. CV: RRR, No M/G/R. No JVD. No thrill. No extra heart sounds. PULM: CTA B, no wheezes, crackles, rhonchi. No retractions. No resp. distress. No accessory muscle use. ABD: S, NT, ND, +BS. No rebound. No HSM. EXTR: No c/c/e NEURO Normal gait.  PSYCH: Normally interactive. Conversant. Not depressed or anxious appearing.  Calm demeanor.  She has a fine, scattered, discrete papular rash on her chest and abdomen.  No vesicles or pustules   Assessment and Plan: Physical exam  Coagulation defect ([place]) - Plan: CBC, Comprehensive metabolic panel, rivaroxaban (XARELTO) 20 MG TABS tablet  Screening for deficiency anemia - Plan: CBC  Screening for diabetes mellitus - Plan: Comprehensive metabolic panel, Hemoglobin A1c  Screening for hyperlipidemia - Plan: Lipid panel  History of melanoma  Ulcerative colitis without complications, unspecified location ([place])  History of blood clots  Rheumatoid arthritis, involving unspecified site, unspecified rheumatoid factor presence ([place]) - Plan: CBC, Comprehensive metabolic panel, TSH, Ambulatory referral to  Rheumatology  Needs flu shot - Plan: Flu Vaccine QUAD 6+ mos PF IM (Fluarix Quad PF)  Folliculitis - Plan: dicloxacillin (DYNAPEN) 250 MG capsule  CPE today Routine labs drawn Flu shot Rash- exact cause is not certain.  Better with pred but now may have element of folliculitis.  Will treat with dicloxacillin for a week.  She will let me know if not resolving continue xarelto for life Referral back to rheumatology- she was told that she had RA in th past, but does not have any current sx.  Would like their input She is also seeing GI later on today Will plan further follow- up pending labs.   Signed Lamar Blinks, MD  Received her labs, message to pt  Results for orders placed or performed in visit on 05/31/18  CBC  Result Value Ref Range   WBC 5.9 4.0 - 10.5 K/uL   RBC 4.73 3.87 - 5.11 Mil/uL   Platelets 204.0 150.0 - 400.0 K/uL   Hemoglobin 14.5 12.0 - 15.0 g/dL   HCT 42.7 36.0 - 46.0 %   MCV 90.3 78.0 - 100.0 fl   MCHC 34.0 30.0 - 36.0 g/dL   RDW 12.9 11.5 - 15.5 %  Comprehensive metabolic panel  Result Value Ref Range   Sodium 138 135 - 145 mEq/L   Potassium 4.0 3.5 - 5.1 mEq/L   Chloride 102 96 - 112 mEq/L   CO2 29 19 - 32 mEq/L   Glucose, Bld 75 70 - 99 mg/dL   BUN 11 6 - 23 mg/dL   Creatinine, Ser 0.73 0.40 - 1.20 mg/dL   Total Bilirubin 0.7 0.2 - 1.2 mg/dL   Alkaline Phosphatase 44 39 - 117 U/L   AST 15 0 - 37 U/L   ALT 17 0 - 35 U/L   Total Protein 6.7 6.0 - 8.3 g/dL   Albumin 4.4 3.5 - 5.2 g/dL   Calcium 9.2 8.4 - 10.5 mg/dL   GFR 91.34 >60.00 mL/min  Hemoglobin A1c  Result Value Ref Range   Hgb A1c MFr Bld 5.4 4.6 - 6.5 %  Lipid  panel  Result Value Ref Range   Cholesterol 162 0 - 200 mg/dL   Triglycerides 152.0 (H) 0.0 - 149.0 mg/dL   HDL 58.50 >39.00 mg/dL   VLDL 30.4 0.0 - 40.0 mg/dL   LDL Cholesterol 73 0 - 99 mg/dL   Total CHOL/HDL Ratio 3    NonHDL 103.74   TSH  Result Value Ref Range   TSH 1.09 0.35 - 4.50 uIU/mL    Your blood counts  are normal Metabolic profile is normal A1c does NOT show any sign of diabetes Your cholesterol is overall quite good Thyroid normal  Take care!  I saw that your GI doc wants to do a colonoscopy and wonders about you coming off xarelto for 2 days prior.  I would think this is likely ok but will touch base with Annette Hopkins and let you know

## 2018-05-30 NOTE — Patient Instructions (Addendum)
Great to see you today! However I am so sorry to hear that your father passed away over the summer  We are going to set you up for a rheumatology consultation I wonder if you truly have RA  It seems that your rash may involve a superficial skin infection now.  Let's try dicloxicillin for a week, and continue zyrtec. Let me know how this does for you Continue daily [place] Maintenance, Female Adopting a healthy lifestyle and getting preventive care can go a long way to promote health and wellness. Talk with your health care provider about what schedule of regular examinations is right for you. This is a good chance for you to check in with your provider about disease prevention and staying healthy. In between checkups, there are plenty of things you can do on your own. Experts have done a lot of research about which lifestyle changes and preventive measures are most likely to keep you healthy. Ask your health care provider for more information. Weight and diet Eat a healthy diet  Be sure to include plenty of vegetables, fruits, low-fat dairy products, and lean protein.  Do not eat a lot of foods high in solid fats, added sugars, or salt.  Get regular exercise. This is one of the most important things you can do for your health. ? Most adults should exercise for at least 150 minutes each week. The exercise should increase your heart rate and make you sweat (moderate-intensity exercise). ? Most adults should also do strengthening exercises at least twice a week. This is in addition to the moderate-intensity exercise.  Maintain a healthy weight  Body mass index (BMI) is a measurement that can be used to identify possible weight problems. It estimates body fat based on height and weight. Your health care provider can help determine your BMI and help you achieve or maintain a healthy weight.  For females 29 years of age and older: ? A BMI below 18.5 is considered underweight. ? A BMI of  18.5 to 24.9 is normal. ? A BMI of 25 to 29.9 is considered overweight. ? A BMI of 30 and above is considered obese.  Watch levels of cholesterol and blood lipids  You should start having your blood tested for lipids and cholesterol at 45 years of age, then have this test every 5 years.  You may need to have your cholesterol levels checked more often if: ? Your lipid or cholesterol levels are high. ? You are older than 45 years of age. ? You are at high risk for heart disease.  Cancer screening Lung Cancer  Lung cancer screening is recommended for adults 41-71 years old who are at high risk for lung cancer because of a history of smoking.  A yearly low-dose CT scan of the lungs is recommended for people who: ? Currently smoke. ? Have quit within the past 15 years. ? Have at least a 30-pack-year history of smoking. A pack year is smoking an average of one pack of cigarettes a day for 1 year.  Yearly screening should continue until it has been 15 years since you quit.  Yearly screening should stop if you develop a health problem that would prevent you from having lung cancer treatment.  Breast Cancer  Practice breast self-awareness. This means understanding how your breasts normally appear and feel.  It also means doing regular breast self-exams. Let your health care provider know about any changes, no matter how small.  If you are  in your 21s or 30s, you should have a clinical breast exam (CBE) by a health care provider every 1-3 years as part of a regular health exam.  If you are 72 or older, have a CBE every year. Also consider having a breast X-ray (mammogram) every year.  If you have a family history of breast cancer, talk to your health care provider about genetic screening.  If you are at high risk for breast cancer, talk to your health care provider about having an MRI and a mammogram every year.  Breast cancer gene (BRCA) assessment is recommended for women who have  family members with BRCA-related cancers. BRCA-related cancers include: ? Breast. ? Ovarian. ? Tubal. ? Peritoneal cancers.  Results of the assessment will determine the need for genetic counseling and BRCA1 and BRCA2 testing.  Cervical Cancer Your health care provider may recommend that you be screened regularly for cancer of the pelvic organs (ovaries, uterus, and vagina). This screening involves a pelvic examination, including checking for microscopic changes to the surface of your cervix (Pap test). You may be encouraged to have this screening done every 3 years, beginning at age 46.  For women ages 81-65, health care providers may recommend pelvic exams and Pap testing every 3 years, or they may recommend the Pap and pelvic exam, combined with testing for human papilloma virus (HPV), every 5 years. Some types of HPV increase your risk of cervical cancer. Testing for HPV may also be done on women of any age with unclear Pap test results.  Other health care providers may not recommend any screening for nonpregnant women who are considered low risk for pelvic cancer and who do not have symptoms. Ask your health care provider if a screening pelvic exam is right for you.  If you have had past treatment for cervical cancer or a condition that could lead to cancer, you need Pap tests and screening for cancer for at least 20 years after your treatment. If Pap tests have been discontinued, your risk factors (such as having a new sexual partner) need to be reassessed to determine if screening should resume. Some women have medical problems that increase the chance of getting cervical cancer. In these cases, your health care provider may recommend more frequent screening and Pap tests.  Colorectal Cancer  This type of cancer can be detected and often prevented.  Routine colorectal cancer screening usually begins at 45 years of age and continues through 45 years of age.  Your health care provider may  recommend screening at an earlier age if you have risk factors for colon cancer.  Your health care provider may also recommend using home test kits to check for hidden blood in the stool.  A small camera at the end of a tube can be used to examine your colon directly (sigmoidoscopy or colonoscopy). This is done to check for the earliest forms of colorectal cancer.  Routine screening usually begins at age 32.  Direct examination of the colon should be repeated every 5-10 years through 45 years of age. However, you may need to be screened more often if early forms of precancerous polyps or small growths are found.  Skin Cancer  Check your skin from head to toe regularly.  Tell your health care provider about any new moles or changes in moles, especially if there is a change in a mole's shape or color.  Also tell your health care provider if you have a mole that is larger than the  size of a pencil eraser.  Always use sunscreen. Apply sunscreen liberally and repeatedly throughout the day.  Protect yourself by wearing long sleeves, pants, a wide-brimmed hat, and sunglasses whenever you are outside.  Heart disease, diabetes, and high blood pressure  High blood pressure causes heart disease and increases the risk of stroke. High blood pressure is more likely to develop in: ? People who have blood pressure in the high end of the normal range (130-139/85-89 mm Hg). ? People who are overweight or obese. ? People who are African American.  If you are 94-19 years of age, have your blood pressure checked every 3-5 years. If you are 19 years of age or older, have your blood pressure checked every year. You should have your blood pressure measured twice-once when you are at a hospital or clinic, and once when you are not at a hospital or clinic. Record the average of the two measurements. To check your blood pressure when you are not at a hospital or clinic, you can use: ? An automated blood pressure  machine at a pharmacy. ? A home blood pressure monitor.  If you are between 41 years and 44 years old, ask your health care provider if you should take aspirin to prevent strokes.  Have regular diabetes screenings. This involves taking a blood sample to check your fasting blood sugar level. ? If you are at a normal weight and have a low risk for diabetes, have this test once every three years after 45 years of age. ? If you are overweight and have a high risk for diabetes, consider being tested at a younger age or more often. Preventing infection Hepatitis B  If you have a higher risk for hepatitis B, you should be screened for this virus. You are considered at high risk for hepatitis B if: ? You were born in a country where hepatitis B is common. Ask your health care provider which countries are considered high risk. ? Your parents were born in a high-risk country, and you have not been immunized against hepatitis B (hepatitis B vaccine). ? You have HIV or AIDS. ? You use needles to inject street drugs. ? You live with someone who has hepatitis B. ? You have had sex with someone who has hepatitis B. ? You get hemodialysis treatment. ? You take certain medicines for conditions, including cancer, organ transplantation, and autoimmune conditions.  Hepatitis C  Blood testing is recommended for: ? Everyone born from [birth year] through 1963. ? Anyone with known risk factors for hepatitis C.  Sexually transmitted infections (STIs)  You should be screened for sexually transmitted infections (STIs) including gonorrhea and chlamydia if: ? You are sexually active and are younger than 45 years of age. ? You are older than 45 years of age and your health care provider tells you that you are at risk for this type of infection. ? Your sexual activity has changed since you were last screened and you are at an increased risk for chlamydia or gonorrhea. Ask your health care provider if you are at  risk.  If you do not have HIV, but are at risk, it may be recommended that you take a prescription medicine daily to prevent HIV infection. This is called pre-exposure prophylaxis (PrEP). You are considered at risk if: ? You are sexually active and do not regularly use condoms or know the HIV status of your partner(s). ? You take drugs by injection. ? You are sexually active with a partner  who has HIV.  Talk with your health care provider about whether you are at high risk of being infected with HIV. If you choose to begin PrEP, you should first be tested for HIV. You should then be tested every 3 months for as long as you are taking PrEP. Pregnancy  If you are premenopausal and you may become pregnant, ask your health care provider about preconception counseling.  If you may become pregnant, take 400 to 800 micrograms (mcg) of folic acid every day.  If you want to prevent pregnancy, talk to your health care provider about birth control (contraception). Osteoporosis and menopause  Osteoporosis is a disease in which the bones lose minerals and strength with aging. This can result in serious bone fractures. Your risk for osteoporosis can be identified using a bone density scan.  If you are 63 years of age or older, or if you are at risk for osteoporosis and fractures, ask your health care provider if you should be screened.  Ask your health care provider whether you should take a calcium or vitamin D supplement to lower your risk for osteoporosis.  Menopause may have certain physical symptoms and risks.  Hormone replacement therapy may reduce some of these symptoms and risks. Talk to your health care provider about whether hormone replacement therapy is right for you. Follow these instructions at home:  Schedule regular health, dental, and eye exams.  Stay current with your immunizations.  Do not use any tobacco products including cigarettes, chewing tobacco, or electronic  cigarettes.  If you are pregnant, do not drink alcohol.  If you are breastfeeding, limit how much and how often you drink alcohol.  Limit alcohol intake to no more than 1 drink per day for nonpregnant women. One drink equals 12 ounces of beer, 5 ounces of wine, or 1 ounces of hard liquor.  Do not use street drugs.  Do not share needles.  Ask your health care provider for help if you need support or information about quitting drugs.  Tell your health care provider if you often feel depressed.  Tell your health care provider if you have ever been abused or do not feel safe at home. This information is not intended to replace advice given to you by your health care provider. Make sure you discuss any questions you have with your health care provider. Document Released: 02/01/2011 Document Revised: 12/25/2015 Document Reviewed: 04/22/2015 Elsevier Interactive Patient Education  [employer].

## 2018-05-31 ENCOUNTER — Encounter: Payer: Self-pay | Admitting: Internal Medicine

## 2018-05-31 ENCOUNTER — Encounter: Payer: Self-pay | Admitting: Family Medicine

## 2018-05-31 ENCOUNTER — Ambulatory Visit (INDEPENDENT_AMBULATORY_CARE_PROVIDER_SITE_OTHER): Payer: Commercial Managed Care - PPO | Admitting: Family Medicine

## 2018-05-31 ENCOUNTER — Ambulatory Visit (INDEPENDENT_AMBULATORY_CARE_PROVIDER_SITE_OTHER): Payer: Commercial Managed Care - PPO | Admitting: Internal Medicine

## 2018-05-31 ENCOUNTER — Telehealth: Payer: Self-pay | Admitting: *Deleted

## 2018-05-31 VITALS — BP 102/60 | HR 82 | Ht 64.0 in | Wt 118.0 lb

## 2018-05-31 VITALS — BP 102/60 | HR 96 | Temp 97.7°F | Resp 16 | Ht 64.0 in | Wt 118.0 lb

## 2018-05-31 DIAGNOSIS — M069 Rheumatoid arthritis, unspecified: Secondary | ICD-10-CM | POA: Diagnosis not present

## 2018-05-31 DIAGNOSIS — Z131 Encounter for screening for diabetes mellitus: Secondary | ICD-10-CM

## 2018-05-31 DIAGNOSIS — L739 Follicular disorder, unspecified: Secondary | ICD-10-CM

## 2018-05-31 DIAGNOSIS — Z7901 Long term (current) use of anticoagulants: Secondary | ICD-10-CM | POA: Diagnosis not present

## 2018-05-31 DIAGNOSIS — Z13220 Encounter for screening for lipoid disorders: Secondary | ICD-10-CM

## 2018-05-31 DIAGNOSIS — Z130 Encounter for screening for diseases of the blood and blood-forming organs and certain disorders involving the immune mechanism: Secondary | ICD-10-CM | POA: Diagnosis not present

## 2018-05-31 DIAGNOSIS — K5100 Ulcerative (chronic) pancolitis without complications: Secondary | ICD-10-CM | POA: Diagnosis not present

## 2018-05-31 DIAGNOSIS — Z23 Encounter for immunization: Secondary | ICD-10-CM | POA: Diagnosis not present

## 2018-05-31 DIAGNOSIS — Z0000 Encounter for general adult medical examination without abnormal findings: Secondary | ICD-10-CM

## 2018-05-31 DIAGNOSIS — D689 Coagulation defect, unspecified: Secondary | ICD-10-CM | POA: Diagnosis not present

## 2018-05-31 DIAGNOSIS — Z86718 Personal history of other venous thrombosis and embolism: Secondary | ICD-10-CM

## 2018-05-31 DIAGNOSIS — K5190 Ulcerative colitis, unspecified, without complications: Secondary | ICD-10-CM

## 2018-05-31 DIAGNOSIS — Z85820 Personal history of malignant melanoma of skin: Secondary | ICD-10-CM

## 2018-05-31 LAB — LIPID PANEL
CHOLESTEROL: 162 mg/dL (ref 0–200)
HDL: 58.5 mg/dL (ref >39.00)
LDL Cholesterol: 73 mg/dL (ref 0–99)
NonHDL: 103.74
TRIGLYCERIDES: 152 mg/dL — AB (ref 0.0–149.0)
Total CHOL/HDL Ratio: 3
VLDL: 30.4 mg/dL (ref 0.0–40.0)

## 2018-05-31 LAB — CBC
HEMATOCRIT: 42.7 % (ref 36.0–46.0)
Hemoglobin: 14.5 g/dL (ref 12.0–15.0)
MCHC: 34 g/dL (ref 30.0–36.0)
MCV: 90.3 fl (ref 78.0–100.0)
Platelets: 204 10*3/uL (ref 150.0–400.0)
RBC: 4.73 Mil/uL (ref 3.87–5.11)
RDW: 12.9 % (ref 11.5–15.5)
WBC: 5.9 10*3/uL (ref 4.0–10.5)

## 2018-05-31 LAB — COMPREHENSIVE METABOLIC PANEL
ALBUMIN: 4.4 g/dL (ref 3.5–5.2)
ALT: 17 U/L (ref 0–35)
AST: 15 U/L (ref 0–37)
Alkaline Phosphatase: 44 U/L (ref 39–117)
BUN: 11 mg/dL (ref 6–23)
CALCIUM: 9.2 mg/dL (ref 8.4–10.5)
CHLORIDE: 102 meq/L (ref 96–112)
CO2: 29 mEq/L (ref 19–32)
Creatinine, Ser: 0.73 mg/dL (ref 0.40–1.20)
GFR: 91.34 mL/min (ref >60.00)
Glucose, Bld: 75 mg/dL (ref 70–99)
Potassium: 4 mEq/L (ref 3.5–5.1)
Sodium: 138 mEq/L (ref 135–145)
Total Bilirubin: 0.7 mg/dL (ref 0.2–1.2)
Total Protein: 6.7 g/dL (ref 6.0–8.3)

## 2018-05-31 LAB — HEMOGLOBIN A1C: HEMOGLOBIN A1C: 5.4 % (ref 4.6–6.5)

## 2018-05-31 LAB — TSH: TSH: 1.09 u[IU]/mL (ref 0.35–4.50)

## 2018-05-31 MED ORDER — DICLOXACILLIN SODIUM 250 MG PO CAPS: 250.0000 mg | ORAL_CAPSULE | Freq: Four times a day (QID) | ORAL | 0 refills | Status: DC

## 2018-05-31 MED ORDER — RIVAROXABAN 20 MG PO TABS: ORAL_TABLET | ORAL | 3 refills | Status: DC

## 2018-05-31 MED ORDER — SUPREP BOWEL PREP KIT 17.5-3.13-1.6 GM/177ML PO SOLN: 1.0000 | ORAL | 0 refills | Status: DC

## 2018-05-31 NOTE — Patient Instructions (Signed)
You have been scheduled for a colonoscopy. Please follow written instructions given to you at your visit today.  Please pick up your prep supplies at the pharmacy within the next 1-3 days. If you use inhalers (even only as needed), please bring them with you on the day of your procedure. Your physician has requested that you go to www.startemmi.com and enter the access code given to you at your visit today. This web site gives a general overview about your procedure. However, you should still follow specific instructions given to you by our office regarding your preparation for the procedure.  If you are age 104 or older, your body mass index should be between 23-30. Your Body mass index is 20.25 kg/m. If this is out of the aforementioned range listed, please consider follow up with your Primary Care Provider.  If you are age 1 or younger, your body mass index should be between 19-25. Your Body mass index is 20.25 kg/m. If this is out of the aformentioned range listed, please consider follow up with your Primary Care Provider.

## 2018-05-31 NOTE — Progress Notes (Signed)
Subjective:    Patient ID: Annette Hopkins, female    DOB: 1971/04/10, 47 y.o.   MRN: [id number]  HPI Annette Hopkins is a 47 year old female with a history of pan ulcerative colitis diagnosed in 2003, history of DVT and PE on Xarelto, history of rheumatoid arthritis who is here for follow-up.  She is here alone today and was last seen on 04/19/2016.  She reports from a colitis standpoint she is been doing very well in fact she has been off Lialda for the better part of 2019.  She stopped this to try to save with medication expense.  She did take the medication for 1 month over the summer when she learned that her father had died suddenly.  She states that in the past during stressful periods her colitis symptoms would flare and so when she learned that her father had died suddenly she took 12 month of Lialda.  She did not have a flare of her colitis symptoms before during or after this period.  He died from a pulmonary embolism in [place] where he had lived for 50 years.  She reports regular bowel movements without diarrhea.  No blood in her stool or melena.  No abdominal pain.  No upper GI or hepatobiliary complaint.  Her last colonoscopy was performed on 06/23/2015.  This showed very mild rectal erythema.  Surveillance biopsy showed mildly active chronic colitis without dysplasia in the right colon and inactive chronic colitis in the left colon and rectum.  There was no dysplasia on any of the surveillance biopsies.  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  Reminder for future care: 6-MP caused severe nausea and vomiting  Review of Systems As per HPI, otherwise negative  Current Medications, Allergies, Past Medical History, Past Surgical History, Family History and Social History were reviewed in [employer] record.     Objective:   Physical Exam BP 102/60   Pulse 82   Ht 5' 4"  (1.626 m)   Wt 118 lb (53.5 kg)   BMI 20.25 kg/m    Constitutional: Well-developed and well-nourished. No distress. HEENT: Normocephalic and atraumatic. Conjunctivae are normal.  No scleral icterus. Neck: Neck supple. Trachea midline. Cardiovascular: Normal rate, regular rhythm and intact distal pulses. No M/R/G Pulmonary/chest: Effort normal and breath sounds normal. No wheezing, rales or rhonchi. Abdominal: Soft, nontender, nondistended. Bowel sounds active throughout.  Extremities: no clubbing, cyanosis, or edema Neurological: Alert and oriented to person place and time. Skin: Skin is warm and dry. Psychiatric: Normal mood and affect. Behavior is normal.     Assessment & Plan:  47 year old female with a history of pan ulcerative colitis diagnosed in 2003, history of DVT and PE on Xarelto, history of rheumatoid arthritis who is here for follow-up.   1.  Pan ulcerative colitis diagnosed 2003 --currently not on therapy and in clinical remission.  We discussed how ulcerative colitis is chronic and very likely to recur if not on therapy.  That said she is doing well without 5-ASA therapy.  I have recommended repeat surveillance colonoscopy at this time.  We discussed the risk, benefits and alternatives and she is agreeable and wishes to proceed.  We will contact Dr. Lorelei Pont her primary care regarding holding Xarelto.  Will hold Xarelto 2 days prior to endoscopic procedures - will instruct when and how to resume after procedure. Benefits and risks of procedure explained including risks of bleeding, perforation, infection, missed lesions, reactions to medications and  possible need for hospitalization and surgery for complications. Additional rare but real risk of stroke or other vascular clotting events off Xarelto also explained and need to seek urgent help if any signs of these problems occur. Will communicate by phone or EMR with patient's  prescribing provider to confirm that holding Xarelto is reasonable in this case.

## 2018-05-31 NOTE — Telephone Encounter (Signed)
   Annette Hopkins January 15, 1971 [id number]  Dear [facility]:  We have scheduled the above named patient for a(n) colonoscopy procedure. Our records show that (s)he is on anticoagulation therapy.  Please advise as to whether the patient may come off their therapy of Xarelto 2 days prior to their procedure which is scheduled for 06/15/18.  Please route your response to Dixon Boos, CMA.  Sincerely,    [facility]

## 2018-06-01 ENCOUNTER — Encounter: Payer: Commercial Managed Care - PPO | Admitting: Family Medicine

## 2018-06-01 ENCOUNTER — Encounter: Payer: Self-pay | Admitting: Family Medicine

## 2018-06-01 NOTE — Telephone Encounter (Signed)
Patient informed per my chart message by Dr. Edilia Bo that she can hold Xarelto 2 days prior to procedure.

## 2018-06-01 NOTE — Telephone Encounter (Signed)
Queried Dr. Marin Olp about having her come off xarelto for 2 days- ? Does she need lovenox.  He does not feel that lovenox is necessary:  Jess: I remember her well!! She does NOT need any lovenox "bridge" for the scope  Message to pt to make sure she is ok with small risk of clot while off xarelto

## 2018-06-12 ENCOUNTER — Other Ambulatory Visit: Payer: Self-pay

## 2018-06-12 ENCOUNTER — Telehealth: Payer: Self-pay | Admitting: Internal Medicine

## 2018-06-12 MED ORDER — MESALAMINE 1.2 G PO TBEC: DELAYED_RELEASE_TABLET | ORAL | 6 refills | Status: DC

## 2018-06-12 NOTE — Telephone Encounter (Signed)
Would resume Lialda 2.4 g daily

## 2018-06-12 NOTE — Telephone Encounter (Signed)
Script sent to pharmacy for pt.

## 2018-06-12 NOTE — Telephone Encounter (Signed)
See note below and advise. 

## 2018-06-12 NOTE — Telephone Encounter (Signed)
Patient states after taking her antibiotics she thinks that she does need medication lialda still, even tho she told Dr.Pyrtle at her last visit on 10.30.19 that she didn't think she needed it. Patient would like a prescription called in to [facility].

## 2018-06-15 ENCOUNTER — Encounter: Payer: Commercial Managed Care - PPO | Admitting: Internal Medicine

## 2018-06-21 ENCOUNTER — Encounter: Payer: Self-pay | Admitting: Internal Medicine

## 2018-06-21 ENCOUNTER — Ambulatory Visit (AMBULATORY_SURGERY_CENTER): Payer: Commercial Managed Care - PPO | Admitting: Internal Medicine

## 2018-06-21 VITALS — BP 100/53 | HR 74 | Temp 99.8°F | Resp 12 | Ht 64.0 in | Wt 118.0 lb

## 2018-06-21 DIAGNOSIS — K5100 Ulcerative (chronic) pancolitis without complications: Secondary | ICD-10-CM | POA: Diagnosis not present

## 2018-06-21 DIAGNOSIS — K5190 Ulcerative colitis, unspecified, without complications: Secondary | ICD-10-CM | POA: Diagnosis not present

## 2018-06-21 DIAGNOSIS — K529 Noninfective gastroenteritis and colitis, unspecified: Secondary | ICD-10-CM

## 2018-06-21 DIAGNOSIS — K5150 Left sided colitis without complications: Secondary | ICD-10-CM | POA: Diagnosis not present

## 2018-06-21 MED ORDER — MESALAMINE 1.2 G PO TBEC: 2.4000 g | DELAYED_RELEASE_TABLET | Freq: Every day | ORAL | 3 refills | Status: DC

## 2018-06-21 MED ORDER — SODIUM CHLORIDE 0.9 % IV SOLN: 500.0000 mL | Freq: Once | INTRAVENOUS | Status: DC

## 2018-06-21 MED ORDER — MESALAMINE 1.2 G PO TBEC: DELAYED_RELEASE_TABLET | ORAL | 6 refills | Status: DC

## 2018-06-21 NOTE — Progress Notes (Signed)
Called to room to assist during endoscopic procedure.  Patient ID and intended procedure confirmed with present staff. Received instructions for my participation in the procedure from the performing physician.  

## 2018-06-21 NOTE — Patient Instructions (Signed)
YOU HAD AN ENDOSCOPIC PROCEDURE TODAY AT [facility]:   Refer to the procedure report that was given to you for any specific questions about what was found during the examination.  If the procedure report does not answer your questions, please call your gastroenterologist to clarify.  If you requested that your care partner not be given the details of your procedure findings, then the procedure report has been included in a sealed envelope for you to review at your convenience later.  YOU SHOULD EXPECT: Some feelings of bloating in the abdomen. Passage of more gas than usual.  Walking can help get rid of the air that was put into your GI tract during the procedure and reduce the bloating. If you had a lower endoscopy (such as a colonoscopy or flexible sigmoidoscopy) you may notice spotting of blood in your stool or on the toilet paper. If you underwent a bowel prep for your procedure, you may not have a normal bowel movement for a few days.  Please Note:  You might notice some irritation and congestion in your nose or some drainage.  This is from the oxygen used during your procedure.  There is no need for concern and it should clear up in a day or so.  SYMPTOMS TO REPORT IMMEDIATELY:   Following lower endoscopy (colonoscopy or flexible sigmoidoscopy):  Excessive amounts of blood in the stool  Significant tenderness or worsening of abdominal pains  Swelling of the abdomen that is new, acute  Fever of 100F or higher   Following upper endoscopy (EGD)  Vomiting of blood or coffee ground material  New chest pain or pain under the shoulder blades  Painful or persistently difficult swallowing  New shortness of breath  Fever of 100F or higher  Black, tarry-looking stools  For urgent or emergent issues, a gastroenterologist can be reached at any hour by calling [phone].   DIET:  We do recommend a small meal at first, but then you may proceed to your regular diet.  Drink  plenty of fluids but you should avoid alcoholic beverages for 24 hours.  ACTIVITY:  You should plan to take it easy for the rest of today and you should NOT DRIVE or use heavy machinery until tomorrow (because of the sedation medicines used during the test).    FOLLOW UP: Our staff will call the number listed on your records the next business day following your procedure to check on you and address any questions or concerns that you may have regarding the information given to you following your procedure. If we do not reach you, we will leave a message.  However, if you are feeling well and you are not experiencing any problems, there is no need to return our call.  We will assume that you have returned to your regular daily activities without incident.  If any biopsies were taken you will be contacted by phone or by letter within the next 1-3 weeks.  Please call us at [phone] if you have not heard about the biopsies in 3 weeks.    SIGNATURES/CONFIDENTIALITY: You and/or your care partner have signed paperwork which will be entered into your electronic medical record.  These signatures attest to the fact that that the information above on your After Visit Summary has been reviewed and is understood.  Full responsibility of the confidentiality of this discharge information lies with you and/or your care-partner.  Await biopsy results  Resume Lialda 2.4gms. Daily  Resume xarelto  tomorrow.

## 2018-06-21 NOTE — Op Note (Addendum)
[facility] Patient Name: Annette Hopkins Procedure Date: 06/21/2018 2:09 PM MRN: [id number] Endoscopist: Jerene Bears , MD Age: 47 Referring MD:  Date of Birth: 09-02-1970 Gender: Female Account #: [id number] Procedure:                Colonoscopy Indications:              High risk colon cancer surveillance: Ulcerative                            pancolitis diagnosis 2003 Medicines:                Monitored Anesthesia Care Procedure:                Pre-Anesthesia Assessment:                           - Prior to the procedure, a History and Physical                            was performed, and patient medications and                            allergies were reviewed. The patient's tolerance of                            previous anesthesia was also reviewed. The risks                            and benefits of the procedure and the sedation                            options and risks were discussed with the patient.                            All questions were answered, and informed consent                            was obtained. Prior Anticoagulants: The patient has                            taken no previous anticoagulant or antiplatelet                            agents. ASA Grade Assessment: II - A patient with                            mild systemic disease. After reviewing the risks                            and benefits, the patient was deemed in                            satisfactory condition to undergo the procedure.  After obtaining informed consent, the colonoscope                            was passed under direct vision. Throughout the                            procedure, the patient's blood pressure, pulse, and                            oxygen saturations were monitored continuously. The                            Colonoscope was introduced through the anus and                            advanced to the terminal ileum. The  colonoscopy was                            performed without difficulty. The patient tolerated                            the procedure well. The quality of the bowel                            preparation was good. The terminal ileum, ileocecal                            valve, appendiceal orifice, and rectum were                            photographed. Scope In: 2:13:23 PM Scope Out: 2:30:55 PM Scope Withdrawal Time: 0 hours 13 minutes 57 seconds  Total Procedure Duration: 0 hours 17 minutes 32 seconds  Findings:                 The digital rectal exam was normal.                           Inflammation was found in the colon, most prominent                            in the cecum, ascending and transverse colon (few                            scattered ulcerations including in the cecum and at                            hepatic flexure). This was graded as Mayo Score 2                            (moderate, with marked erythema, absent vascular                            pattern, friability, erosions), and when compared  to the previous examination, the findings are                            worsened. Four biopsies were taken every 10 cm with                            a cold forceps from the entire colon for dysplasia                            surveillance and ulcerative colitis surveillance.                            These biopsy specimens from the right colon and                            left colon were sent to Pathology.                           Internal hemorrhoids were found during                            retroflexion. The hemorrhoids were small. Complications:            No immediate complications. Estimated Blood Loss:     Estimated blood loss was minimal. Impression:               - Mildly to moderately active (Mayo Score 2)                            pancolitis ulcerative colitis (most prominent in                            the right colon  today), worsened since the last                            examination. Biopsied.                           - Small internal hemorrhoids. Recommendation:           - Patient has a contact number available for                            emergencies. The signs and symptoms of potential                            delayed complications were discussed with the                            patient. Return to normal activities tomorrow.                            Written discharge instructions were provided to the  patient.                           - Resume previous diet.                           - Continue present medications.                           - Please resume Lialda 2.4 g daily.                           - Resume Xarelto at previous dose tomorrow morning.                           - Await pathology results.                           - Repeat colonoscopy is recommended for                            surveillance. The colonoscopy date will be                            determined after pathology results from today's                            exam become available for review. Jerene Bears, MD 06/21/2018 2:40:52 PM This report has been signed electronically.

## 2018-06-21 NOTE — Progress Notes (Signed)
Report given to PACU, vss 

## 2018-06-22 ENCOUNTER — Telehealth: Payer: Self-pay

## 2018-06-22 NOTE — Telephone Encounter (Signed)
  Follow up Call-  Call back number 06/21/2018  Post procedure Call Back phone  # [phone]  Permission to leave phone message Yes  Some recent data might be hidden     Patient questions:  Do you have a fever, pain , or abdominal swelling? No. Pain Score  0 *  Have you tolerated food without any problems? Yes.    Have you been able to return to your normal activities? Yes.    Do you have any questions about your discharge instructions: Diet   No. Medications  No. Follow up visit  No.  Do you have questions or concerns about your Care? No.  Actions: * If pain score is 4 or above: No action needed, pain <4.

## 2018-06-27 ENCOUNTER — Encounter: Payer: Self-pay | Admitting: Internal Medicine

## 2018-06-27 DIAGNOSIS — K5100 Ulcerative (chronic) pancolitis without complications: Secondary | ICD-10-CM | POA: Diagnosis not present

## 2018-06-27 DIAGNOSIS — Z8739 Personal history of other diseases of the musculoskeletal system and connective tissue: Secondary | ICD-10-CM | POA: Insufficient documentation

## 2018-07-03 ENCOUNTER — Ambulatory Visit: Payer: Self-pay

## 2018-07-03 NOTE — Telephone Encounter (Signed)
Pt c/o painful right cheek rash that is slightly red and edematous. Pt stated she is having pain to the right eye and it hurts to smile or talk from the right side of her lips all the way to her right eye. This morning pt stated there was 3 small blisters and now there is one peas sized blister. The rash started Friday. Pt stated the pain is a burning pain and moderate in severity. Care advice given pt and pt verbalized understanding. Pt given an appointment 11:30 tomorrow.  Reason for Disposition . [1] Localized rash is very painful AND [2] no fever  Answer Assessment - Initial Assessment Questions 1. APPEARANCE of RASH: "Describe the rash."      Right cheek, slightly red and edematous, pain to eye and huyurts to smile or to talk feels from right side of lips all the way up eye 2. LOCATION: "Where is the rash located?"      Right cheek 3. NUMBER: "How many spots are there?"      Had 3 small blisters and now 1 big one 4. SIZE: "How big are the spots?" (Inches, centimeters or compare to size of a coin)     Pea sized  5. ONSET: "When did the rash start?"      Late Friday 6. ITCHING: "Does the rash itch?" If so, ask: "How bad is the itch?"  (Scale 1-10; or mild, moderate, severe)     no 7. PAIN: "Does the rash hurt?" If so, ask: "How bad is the pain?"  (Scale 1-10; or mild, moderate, severe)     Yes it burns- moderate 8. OTHER SYMPTOMS: "Do you have any other symptoms?" (e.g., fever)     no 9. PREGNANCY: "Is there any chance you are pregnant?" "When was your last menstrual period?"     n/a  Protocols used: RASH OR REDNESS - LOCALIZED-A-AH

## 2018-07-04 ENCOUNTER — Encounter: Payer: Self-pay | Admitting: Family Medicine

## 2018-07-04 ENCOUNTER — Ambulatory Visit (INDEPENDENT_AMBULATORY_CARE_PROVIDER_SITE_OTHER): Payer: Commercial Managed Care - PPO | Admitting: Family Medicine

## 2018-07-04 VITALS — BP 102/80 | HR 97 | Temp 97.6°F | Resp 18 | Wt 114.8 lb

## 2018-07-04 DIAGNOSIS — B0230 Zoster ocular disease, unspecified: Secondary | ICD-10-CM

## 2018-07-04 DIAGNOSIS — B0239 Other herpes zoster eye disease: Secondary | ICD-10-CM | POA: Diagnosis not present

## 2018-07-04 DIAGNOSIS — B029 Zoster without complications: Secondary | ICD-10-CM | POA: Insufficient documentation

## 2018-07-04 MED ORDER — VALACYCLOVIR HCL 1 G PO TABS: 1000.0000 mg | ORAL_TABLET | Freq: Three times a day (TID) | ORAL | 0 refills | Status: DC

## 2018-07-04 NOTE — Progress Notes (Signed)
Subjective:    Patient ID: Annette Hopkins, female    DOB: [birth year], 47 y.o.   MRN: [id number]  No chief complaint on file.   HPI Patient is in today for evaluation of painful blistering on right side of face started about 4 days ago. Was initially on lip but has spread up the face to below the eye and now the eye is itchy and uncomfortable, maybe some mild photophobia. She has been under stress with her mother now living with her. Cooking the Holiday meals and she recently had a bad rash and secondary infection on her abdomen that required steroids and then antibiotics. Denies CP/palp/SOB/HA/congestion/fevers/GI or GU c/o. Taking meds as prescribed  Past Medical History:  Diagnosis Date  . Abnormal LFTs   . Esophagitis   . History of blood clots     B leg DVT 2005, abdomen. On Coumadin for life  . History of blood transfusion    after reaction to 6-MP  . Long term (current) use of anticoagulants   . Pulmonary embolism ([facility])   . RA (rheumatoid arthritis) ([facility])    ~2005  . Ulcerative colitis    dx  around 2004    Past Surgical History:  Procedure Laterality Date  . vascular stents bilateral groins  2006    Family History  Problem Relation Age of Onset  . Colon cancer Paternal Grandmother   . Colon polyps Paternal Grandmother   . Breast cancer Mother   . CAD Neg Hx   . Clotting disorder Neg Hx   . Stomach cancer Neg Hx   . Esophageal cancer Neg Hx     Social History   Socioeconomic History  . Marital status: Married    Spouse name: Not on file  . Number of children: 1  . Years of education: Not on file  . Highest education level: Not on file  Occupational History  . Occupation: Pharmacist, hospital  Social Needs  . Financial resource strain: Not on file  . Food insecurity:    Worry: Not on file    Inability: Not on file  . Transportation needs:    Medical: Not on file    Non-medical: Not on file  Tobacco Use  . Smoking status: Never Smoker  . Smokeless tobacco: Never  Used  . Tobacco comment: Never Used Tobacco  Substance and Sexual Activity  . Alcohol use: Yes    Alcohol/week: 7.0 standard drinks    Types: 7 Glasses of wine per week  . Drug use: No  . Sexual activity: Not on file  Lifestyle  . Physical activity:    Days per week: Not on file    Minutes per session: Not on file  . Stress: Not on file  Relationships  . Social connections:    Talks on phone: Not on file    Gets together: Not on file    Attends religious service: Not on file    Active member of club or organization: Not on file    Attends meetings of clubs or organizations: Not on file    Relationship status: Not on file  . Intimate partner violence:    Fear of current or ex partner: Not on file    Emotionally abused: Not on file    Physically abused: Not on file    Forced sexual activity: Not on file  Other Topics Concern  . 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 levonorgestrel (MIRENA) 20 MCG/24HR IUD 1 each by Intrauterine route once.    . mesalamine (LIALDA) 1.2 g EC tablet Take 2 tablets (2.4 g total) by mouth daily after supper. 180 tablet 3  . mesalamine (LIALDA) 1.2 g EC tablet TAKE 2 TABLETS(2.4 GRAMS) BY MOUTH DAILY 60 tablet 6  . rivaroxaban (XARELTO) 20 MG TABS tablet TAKE 1 TABLET(20 MG) BY MOUTH DAILY WITH DINNER 90 tablet 3   No facility-administered medications prior to visit.     Allergies  Allergen Reactions  . Mercaptopurine Nausea And Vomiting  . Sulfa Antibiotics Hives    ROS     Objective:    Physical Exam  BP 102/80 (BP Location: Left Arm, Patient Position: Sitting, Cuff Size: Normal)   Pulse 97   Temp 97.6 F (36.4 C) (Oral)   Resp 18   Wt 114 lb 12.8 oz (52.1 kg)   SpO2 100%   BMI 19.71 kg/m  Wt Readings from Last 3 Encounters:  07/04/18 114 lb  12.8 oz (52.1 kg)  06/21/18 118 lb (53.5 kg)  05/31/18 118 lb (53.5 kg)     Lab Results  Component Value Date   WBC 5.9 05/31/2018   HGB 14.5 05/31/2018   HCT 42.7 05/31/2018   PLT 204.0 05/31/2018   GLUCOSE 75 05/31/2018   CHOL 162 05/31/2018   TRIG 152.0 (H) 05/31/2018   HDL 58.50 05/31/2018   LDLCALC 73 05/31/2018   ALT 17 05/31/2018   AST 15 05/31/2018   NA 138 05/31/2018   K 4.0 05/31/2018   CL 102 05/31/2018   CREATININE 0.73 05/31/2018   BUN 11 05/31/2018   CO2 29 05/31/2018   TSH 1.09 05/31/2018   INR 2.6 03/11/2014   HGBA1C 5.4 05/31/2018    Lab Results  Component Value Date   TSH 1.09 05/31/2018   Lab Results  Component Value Date   WBC 5.9 05/31/2018   HGB 14.5 05/31/2018   HCT 42.7 05/31/2018   MCV 90.3 05/31/2018   PLT 204.0 05/31/2018   Lab Results  Component Value Date   NA 138 05/31/2018   K 4.0 05/31/2018   CO2 29 05/31/2018   GLUCOSE 75 05/31/2018   BUN 11 05/31/2018   CREATININE 0.73 05/31/2018   BILITOT 0.7 05/31/2018   ALKPHOS 44 05/31/2018   AST 15 05/31/2018   ALT 17 05/31/2018   PROT 6.7 05/31/2018   ALBUMIN 4.4 05/31/2018   CALCIUM 9.2 05/31/2018   GFR 91.34 05/31/2018   Lab Results  Component Value Date   CHOL 162 05/31/2018   Lab Results  Component Value Date   HDL 58.50 05/31/2018   Lab Results  Component Value Date   LDLCALC 73 05/31/2018   Lab Results  Component Value Date   TRIG 152.0 (H) 05/31/2018   Lab Results  Component Value Date   CHOLHDL 3 05/31/2018   Lab Results  Component Value Date   HGBA1C 5.4 05/31/2018       Assessment & Plan:   Problem List Items Addressed This Visit    Shingles - Primary    On right side of face from upper lip to just below the eye with some itching and irritation in eye noted. Started on Valtrex 1000 mg tid and referred to [facility] for further consideration today  Relevant Medications   valACYclovir (VALTREX) 1000 MG tablet   Other Relevant  Orders   Ambulatory referral to Ophthalmology      I am having Carrine B. Alkhatib start on valACYclovir. I am also having her maintain her levonorgestrel, rivaroxaban, mesalamine, and mesalamine.  Meds ordered this encounter  Medications  . valACYclovir (VALTREX) 1000 MG tablet    Sig: Take 1 tablet (1,000 mg total) by mouth 3 (three) times daily.    Dispense:  21 tablet    Refill:  0     Penni Homans, MD

## 2018-07-04 NOTE — Assessment & Plan Note (Signed)
On right side of face from upper lip to just below the eye with some itching and irritation in eye noted. Started on Valtrex 1000 mg tid and referred to [facility] for further consideration today

## 2018-07-04 NOTE — Patient Instructions (Signed)
Shingles Shingles, which is also known as herpes zoster, is an infection that causes a painful skin rash and fluid-filled blisters. Shingles is not related to genital herpes, which is a sexually transmitted infection. Shingles only develops in people who:  Have had chickenpox.  Have received the chickenpox vaccine. (This is rare.)  What are the causes? Shingles is caused by varicella-zoster virus (VZV). This is the same virus that causes chickenpox. After exposure to VZV, the virus stays in the body in an inactive (dormant) state. Shingles develops if the virus reactivates. This can happen many years after the initial exposure to VZV. It is not known what causes this virus to reactivate. What increases the risk? People who have had chickenpox or received the chickenpox vaccine are at risk for shingles. Infection is more common in people who:  Are older than age 44.  Have a weakened defense (immune) system, such as those with HIV, AIDS, or cancer.  Are taking medicines that weaken the immune system, such as transplant medicines.  Are under great stress.  What are the signs or symptoms? Early symptoms of this condition include itching, tingling, and pain in an area on your skin. Pain may be described as burning, stabbing, or throbbing. A few days or weeks after symptoms start, a painful red rash appears, usually on one side of the body in a bandlike or beltlike pattern. The rash eventually turns into fluid-filled blisters that break open, scab over, and dry up in about 2-3 weeks. At any time during the infection, you may also develop:  A fever.  Chills.  A headache.  An upset stomach.  How is this diagnosed? This condition is diagnosed with a skin exam. Sometimes, skin or fluid samples are taken from the blisters before a diagnosis is made. These samples are examined under a microscope or sent to a lab for testing. How is this treated? There is no specific cure for this condition.  Your health care provider will probably prescribe medicines to help you manage pain, recover more quickly, and avoid long-term problems. Medicines may include:  Antiviral drugs.  Anti-inflammatory drugs.  Pain medicines.  If the area involved is on your face, you may be referred to a specialist, such as an eye doctor (ophthalmologist) or an ear, nose, and throat (ENT) doctor to help you avoid eye problems, chronic pain, or disability. Follow these instructions at home: Medicines  Take medicines only as directed by your health care provider.  Apply an anti-itch or numbing cream to the affected area as directed by your health care provider. Blister and Rash Care  Take a cool bath or apply cool compresses to the area of the rash or blisters as directed by your health care provider. This may help with pain and itching.  Keep your rash covered with a loose bandage (dressing). Wear loose-fitting clothing to help ease the pain of material rubbing against the rash.  Keep your rash and blisters clean with mild soap and cool water or as directed by your health care provider.  Check your rash every day for signs of infection. These include redness, swelling, and pain that lasts or increases.  Do not pick your blisters.  Do not scratch your rash. General instructions  Rest as directed by your health care provider.  Keep all follow-up visits as directed by your health care provider. This is important.  Until your blisters scab over, your infection can cause chickenpox in people who have never had it or been vaccinated  against it. To prevent this from happening, avoid contact with other people, especially: ? Babies. ? Pregnant women. ? Children who have eczema. ? Elderly people who have transplants. ? People who have chronic illnesses, such as leukemia or AIDS. Contact a health care provider if:  Your pain is not relieved with prescribed medicines.  Your pain does not get better after  the rash heals.  Your rash looks infected. Signs of infection include redness, swelling, and pain that lasts or increases. Get help right away if:  The rash is on your face or nose.  You have facial pain, pain around your eye area, or loss of feeling on one side of your face.  You have ear pain or you have ringing in your ear.  You have loss of taste.  Your condition gets worse. This information is not intended to replace advice given to you by your health care provider. Make sure you discuss any questions you have with your health care provider. Document Released: 07/19/2005 Document Revised: 03/14/2016 Document Reviewed: 05/30/2014 Elsevier Interactive Patient Education  2018 [employer].

## 2018-07-05 ENCOUNTER — Encounter (HOSPITAL_BASED_OUTPATIENT_CLINIC_OR_DEPARTMENT_OTHER): Payer: Self-pay | Admitting: Emergency Medicine

## 2018-07-05 ENCOUNTER — Emergency Department (HOSPITAL_BASED_OUTPATIENT_CLINIC_OR_DEPARTMENT_OTHER)
Admission: EM | Admit: 2018-07-05 | Discharge: 2018-07-05 | Disposition: A | Discharge status: Home / Self Care | Payer: Commercial Managed Care - PPO | Attending: Emergency Medicine | Admitting: Emergency Medicine

## 2018-07-05 ENCOUNTER — Other Ambulatory Visit: Payer: Self-pay

## 2018-07-05 DIAGNOSIS — Z7901 Long term (current) use of anticoagulants: Secondary | ICD-10-CM | POA: Diagnosis not present

## 2018-07-05 DIAGNOSIS — B029 Zoster without complications: Secondary | ICD-10-CM | POA: Diagnosis not present

## 2018-07-05 DIAGNOSIS — Z85828 Personal history of other malignant neoplasm of skin: Secondary | ICD-10-CM | POA: Diagnosis not present

## 2018-07-05 DIAGNOSIS — Z79899 Other long term (current) drug therapy: Secondary | ICD-10-CM | POA: Diagnosis not present

## 2018-07-05 DIAGNOSIS — R220 Localized swelling, mass and lump, head: Secondary | ICD-10-CM | POA: Diagnosis present

## 2018-07-05 DIAGNOSIS — R21 Rash and other nonspecific skin eruption: Secondary | ICD-10-CM | POA: Diagnosis not present

## 2018-07-05 MED ORDER — UP4 PROBIOTICS ADULT PO CAPS: 1.0000 | ORAL_CAPSULE | Freq: Two times a day (BID) | ORAL | 0 refills | Status: DC

## 2018-07-05 MED ORDER — OXYCODONE-ACETAMINOPHEN 5-325 MG PO TABS: 2.0000 | ORAL_TABLET | ORAL | 0 refills | Status: DC | PRN

## 2018-07-05 MED ORDER — ONDANSETRON 4 MG PO TBDP: 4.0000 mg | ORAL_TABLET | Freq: Three times a day (TID) | ORAL | 0 refills | Status: DC | PRN

## 2018-07-05 MED ORDER — ONDANSETRON 8 MG PO TBDP
8.0000 mg | ORAL_TABLET | Freq: Once | ORAL | Status: AC
  Administered 2018-07-05: 8 mg via ORAL
  Filled 2018-07-05: qty 1

## 2018-07-05 MED ORDER — PREDNISONE 50 MG PO TABS
60.0000 mg | ORAL_TABLET | Freq: Once | ORAL | Status: AC
  Administered 2018-07-05: 60 mg via ORAL
  Filled 2018-07-05: qty 1

## 2018-07-05 MED ORDER — MORPHINE SULFATE (PF) 4 MG/ML IV SOLN
4.0000 mg | Freq: Once | INTRAVENOUS | Status: AC
  Administered 2018-07-05: 4 mg via INTRAMUSCULAR
  Filled 2018-07-05: qty 1

## 2018-07-05 MED ORDER — GABAPENTIN 300 MG PO CAPS: 300.0000 mg | ORAL_CAPSULE | Freq: Every day | ORAL | 0 refills | Status: DC

## 2018-07-05 MED ORDER — PREDNISONE 50 MG PO TABS: 50.0000 mg | ORAL_TABLET | Freq: Every day | ORAL | 0 refills | Status: DC

## 2018-07-05 MED ORDER — GABAPENTIN 300 MG PO CAPS
300.0000 mg | ORAL_CAPSULE | Freq: Once | ORAL | Status: AC
  Administered 2018-07-05: 300 mg via ORAL
  Filled 2018-07-05: qty 1

## 2018-07-05 NOTE — ED Notes (Signed)
Rash with blisters to right side of face and lips. Pt states that blisters started on lips on Saturday and progressively spread to right side of face. She was prescribes valtrex and started taking yesterday.

## 2018-07-05 NOTE — ED Triage Notes (Signed)
Pt states rash on face started Saturday on lips and spread to right side of face.

## 2018-07-05 NOTE — ED Provider Notes (Signed)
[facility] EMERGENCY DEPARTMENT Provider Note   CSN: [id number] Arrival date & time: 07/05/18  0827     History   Chief Complaint Chief Complaint  Patient presents with  . Rash  . Herpes Zoster    HPI ROMONIA Hopkins is a 47 y.o. female.  Pt presents to the ED today with shingles pain.  She said blisters started on Saturday, 11/30.  They spread to the right side of her face and lips.  She saw her doctor yesterday and was started on Valtrex.  She also went to the ophthalmologist who said her eyes were ok.  Blisters start below her right eye and go down to her right upper lip.  No nose involvement.  Pt has been able to eat and drink.  She has been taking ibuprofen for pain, but that has not been helping.     Past Medical History:  Diagnosis Date  . Abnormal LFTs   . Esophagitis   . History of blood clots     B leg DVT 2005, abdomen. On Coumadin for life  . History of blood transfusion    after reaction to 6-MP  . Long term (current) use of anticoagulants   . Pulmonary embolism ([place])   . RA (rheumatoid arthritis) ([facility])    ~2005  . Ulcerative colitis    dx  around 2004    Patient Active Problem List   Diagnosis Date Noted  . Shingles 07/04/2018  . Melanoma ([place]) 02/20/2013  . Headache(784.0) 04/20/2012  . Osteopenia 02/21/2012  . GERD (gastroesophageal reflux disease) 02/17/2011  . Coagulation defect ([place]) 02/16/2011  . Transaminitis 11/11/2010  . Ulcerative colitis   . History of blood clots   . RA (rheumatoid arthritis) ([place])   . Polycystic ovaries 07/31/2010    Past Surgical History:  Procedure Laterality Date  . vascular stents bilateral groins  2006     OB History   None      Home Medications    Prior to Admission medications   Medication Sig Start Date End Date Taking? Authorizing Provider  gabapentin (NEURONTIN) 300 MG capsule Take 1 capsule (300 mg total) by mouth at bedtime. 07/05/18   Isla Pence, MD  levonorgestrel (MIRENA) 20  MCG/24HR IUD 1 each by Intrauterine route once.    [provider]  mesalamine (LIALDA) 1.2 g EC tablet Take 2 tablets (2.4 g total) by mouth daily after supper. 06/21/18   Pyrtle, Lajuan Lines, MD  mesalamine (LIALDA) 1.2 g EC tablet TAKE 2 TABLETS(2.4 GRAMS) BY MOUTH DAILY 06/21/18   Pyrtle, Lajuan Lines, MD  ondansetron (ZOFRAN ODT) 4 MG disintegrating tablet Take 1 tablet (4 mg total) by mouth every 8 (eight) hours as needed. 07/05/18   Isla Pence, MD  oxyCODONE-acetaminophen (PERCOCET/ROXICET) 5-325 MG tablet Take 2 tablets by mouth every 4 (four) hours as needed for severe pain. 07/05/18   Isla Pence, MD  predniSONE (DELTASONE) 50 MG tablet Take 1 tablet (50 mg total) by mouth daily. 07/05/18   Isla Pence, MD  rivaroxaban (XARELTO) 20 MG TABS tablet TAKE 1 TABLET(20 MG) BY MOUTH DAILY WITH DINNER 05/31/18   Copland, Gay Filler, MD  valACYclovir (VALTREX) 1000 MG tablet Take 1 tablet (1,000 mg total) by mouth 3 (three) times daily. 07/04/18   Mosie Lukes, MD    Family History Family History  Problem Relation Age of Onset  . Colon cancer Paternal Grandmother   . Colon polyps Paternal Grandmother   . Breast cancer Mother   .  CAD Neg Hx   . Clotting disorder Neg Hx   . Stomach cancer Neg Hx   . Esophageal cancer Neg Hx     Social History Social History   Tobacco Use  . Smoking status: Never Smoker  . Smokeless tobacco: Never Used  . Tobacco comment: Never Used Tobacco  Substance Use Topics  . Alcohol use: Yes    Alcohol/week: 7.0 standard drinks    Types: 7 Glasses of wine per week  . Drug use: No     Allergies   Mercaptopurine and Sulfa antibiotics   Review of Systems Review of Systems  Skin: Positive for rash.  All other systems reviewed and are negative.    Physical Exam Updated Vital Signs BP 125/89 (BP Location: Right Arm)   Pulse 98   Temp 98.4 F (36.9 C) (Oral)   Resp 16   Ht 5' 4"  (1.626 m)   Wt 52.2 kg   SpO2 99%   BMI 19.74 kg/m    Physical Exam  Constitutional: She is oriented to person, place, and time. She appears well-developed and well-nourished.  HENT:  Right Ear: External ear normal.  Left Ear: External ear normal.  Nose: Nose normal.  Shingles under right eye down to right upper lip.  No nose involvement.  Right eye is not red or irritated.  Eyes: Pupils are equal, round, and reactive to light. Conjunctivae and EOM are normal.  Neck: Normal range of motion. Neck supple.  Cardiovascular: Normal rate, regular rhythm, normal heart sounds and intact distal pulses.  Pulmonary/Chest: Effort normal and breath sounds normal.  Abdominal: Soft. Bowel sounds are normal.  Musculoskeletal: Normal range of motion.  Neurological: She is alert and oriented to person, place, and time.  Skin: Skin is warm. Capillary refill takes less than 2 seconds.  Psychiatric: She has a normal mood and affect. Her behavior is normal. Judgment and thought content normal.  Nursing note and vitals reviewed.    ED Treatments / Results  Labs (all labs ordered are listed, but only abnormal results are displayed) Labs Reviewed - No data to display  EKG None  Radiology No results found.  Procedures Procedures (including critical care time)  Medications Ordered in ED Medications  predniSONE (DELTASONE) tablet 60 mg (60 mg Oral Given 07/05/18 0910)  gabapentin (NEURONTIN) capsule 300 mg (300 mg Oral Given 07/05/18 0910)  morphine 4 MG/ML injection 4 mg (4 mg Intramuscular Given 07/05/18 0910)  ondansetron (ZOFRAN-ODT) disintegrating tablet 8 mg (8 mg Oral Given 07/05/18 0910)     Initial Impression / Assessment and Plan / ED Course  I have reviewed the triage vital signs and the nursing notes.  Pertinent labs & imaging results that were available during my care of the patient were reviewed by me and considered in my medical decision making (see chart for details).     Pt is already on valtrex.  I will start her on percocet,  prednisone, and neurontin.  She knows to return if worse.  Final Clinical Impressions(s) / ED Diagnoses   Final diagnoses:  Herpes zoster without complication    ED Discharge Orders         Ordered    oxyCODONE-acetaminophen (PERCOCET/ROXICET) 5-325 MG tablet  Every 4 hours PRN     07/05/18 0934    ondansetron (ZOFRAN ODT) 4 MG disintegrating tablet  Every 8 hours PRN     07/05/18 0934    gabapentin (NEURONTIN) 300 MG capsule  Daily at bedtime  07/05/18 0934    predniSONE (DELTASONE) 50 MG tablet  Daily     07/05/18 0934           Isla Pence, MD 07/05/18 [id number]

## 2018-07-07 ENCOUNTER — Telehealth: Payer: Self-pay | Admitting: *Deleted

## 2018-07-07 NOTE — Telephone Encounter (Signed)
Received Eye Exam results from [facility]; forwarded to provider/SLS 12/06

## 2018-07-09 ENCOUNTER — Encounter: Payer: Self-pay | Admitting: Family Medicine

## 2018-07-13 ENCOUNTER — Encounter: Payer: Self-pay | Admitting: Family Medicine

## 2018-07-19 ENCOUNTER — Ambulatory Visit: Payer: Self-pay

## 2018-07-19 NOTE — Telephone Encounter (Signed)
Pt with shingles to the right side of the face called c/o numbness from the right top lip to the hairline of the right side of the face. Pt stated the numbness started yesterday. Numbness is constant and is present now. Pt stated that the pain has turned to numbness. Pt was diagnosed with shingles to the right side of the face 2.5 weeks ago. Pt stated that the lesions have crusted over. Pt has no arm drift, no weakness or numbness to the arms legs or the left side of the face. No slurred speech and smile is even per pt.  Pt has a h/o RA. Pt stated that she wakes up with her right eye swollen shut, but after she is out of bed for a while, the swelling improves. Pt stated that she has occasional palpitations. Pt denies chest pain, difficulty breathing. Pt denies dizziness, vision loss, double vision, changes in speech or unsteady on her feet. Care advice given and pt verbalized understanding. Pt given appointment for tomorrow with PCP. Reason for Disposition . [1] Shingle rash already diagnosed AND [2] weak immune system (e.g., HIV positive,  cancer chemotherapy, chronic steroid treatment, splenectomy) AND [3]  taking antiviral medication    Pt with history of RA  Answer Assessment - Initial Assessment Questions 1. SYMPTOM: "What is the main symptom you are concerned about?" (e.g., weakness, numbness)     Numbness from top lip to hairline on right side of the face 2. ONSET: "When did this start?" (minutes, hours, days; while sleeping)     yesterday 3. LAST NORMAL: "When was the last time you were normal (no symptoms)?"     Had shingles 2.5 weeks ago the pain has turned into numbness 4. PATTERN "Does this come and go, or has it been constant since it started?"  "Is it present now?"     contant-yes 5. CARDIAC SYMPTOMS: "Have you had any of the following symptoms: chest pain, difficulty breathing, palpitations?"     Occasional palpitation 6. NEUROLOGIC SYMPTOMS: "Have you had any of the following  symptoms: headache, dizziness, vision loss, double vision, changes in speech, unsteady on your feet?"     no 7. OTHER SYMPTOMS: "Do you have any other symptoms?"     No drift, no arms leg weakness or numbness, right eye is swollen shut initailly after waking up in the morning and swelling decreases after washing face 8. PREGNANCY: "Is there any chance you are pregnant?" "When was your last menstrual period?"     No-n/a  Protocols used: SHINGLES-A-AH, NEUROLOGIC DEFICIT-A-AH

## 2018-07-19 NOTE — Progress Notes (Signed)
[place] at [facility], [place], [place] [address] [phone]  Date:  07/20/2018   Name:  Annette Hopkins   DOB:  [birth year]   MRN:  [id number]  PCP:  Darreld Mclean, MD    Chief Complaint: Herpes Zoster (shooting pain in right eye, numbness on right side of face due to shingles)   History of Present Illness:  Annette Hopkins is a 47 y.o. very pleasant female patient who presents with the following:  Checking on shingles today- she was first seen in the office with this on 12/3, then ended up on the ER on 12/4 She was treated with valtrex, percocet, prednisone and neurontin  She sent me the following message on 12/12 and I asked her to come in : I attempted to return to work yesterday, and then again today. I am extremely fatigued and cannot get through the day without feeling like I'm going to collapse. My face is still pretty sensitive. I just wonder if this is normal after dealing with shingles.   She has notes that the right side of her face seemed to go numb on Tuesday- 2 days ago, today is Thursday She feels like the right side of her face has been numbed with novocain The numbness is in the distribution of her original shingles vesicular outbreak She shows me some photos of her face when her outbreak is at its worst.  She had quite a dramatic presentation, with crusting and vesicles over the right side of her face, running from underneath her eye to the right side of her upper lip  Her vision is ok.  She does wear corrective lenses but not does not notice any vision change Her eye is tearing, and it may be painful to move her eye  She did see optho earlier this month and was told that the shingles infection was not affecting her eye  She is done with all her meds now that she was given No fever noted  She is able to move her face ok although it is numb No other neurologic symptoms, no slurred speech No  hearing loss noted She has a within-date IUD so we are not concerned about pregnancy  She requests a refill of her pain medication, she got 15 pills from the ER earlier this month Reviewed [place]- ok   Lilyanna has had to miss nearly 2 weeks of work with this illness.  She has 1 more day of work to go, tomorrow, and then will be on holiday break.  She is looking forward to a chance to rest  She takes Xarelto due to presence of vascular stents in her groin and history of blood clots Also history of rheumatoid arthritis, and ulcerative colitis Patient Active Problem List   Diagnosis Date Noted  . Shingles 07/04/2018  . Melanoma ([place]) 02/20/2013  . Headache(784.0) 04/20/2012  . Osteopenia 02/21/2012  . GERD (gastroesophageal reflux disease) 02/17/2011  . Coagulation defect ([place]) 02/16/2011  . Transaminitis 11/11/2010  . Ulcerative colitis   . History of blood clots   . RA (rheumatoid arthritis) ([place])   . Polycystic ovaries 07/31/2010    Past Medical History:  Diagnosis Date  . Abnormal LFTs   . Esophagitis   . History of blood clots     B leg DVT 2005, abdomen. On Coumadin for life  . History of blood transfusion    after reaction to 6-MP  .  Long term (current) use of anticoagulants   . Pulmonary embolism ([place])   . RA (rheumatoid arthritis) ([facility])    ~2005  . Ulcerative colitis    dx  around 2004    Past Surgical History:  Procedure Laterality Date  . vascular stents bilateral groins  2006    Social History   Tobacco Use  . Smoking status: Never Smoker  . Smokeless tobacco: Never Used  . Tobacco comment: Never Used Tobacco  Substance Use Topics  . Alcohol use: Yes    Alcohol/week: 7.0 standard drinks    Types: 7 Glasses of wine per week  . Drug use: No    Family History  Problem Relation Age of Onset  . Colon cancer Paternal Grandmother   . Colon polyps Paternal Grandmother   . Breast cancer Mother   . CAD Neg Hx   . Clotting disorder Neg Hx   . Stomach  cancer Neg Hx   . Esophageal cancer Neg Hx     Allergies  Allergen Reactions  . Mercaptopurine Nausea And Vomiting  . Sulfa Antibiotics Hives    Medication list has been reviewed and updated.  Current Outpatient Medications on File Prior to Visit  Medication Sig Dispense Refill  . gabapentin (NEURONTIN) 300 MG capsule Take 1 capsule (300 mg total) by mouth at bedtime. 14 capsule 0  . levonorgestrel (MIRENA) 20 MCG/24HR IUD 1 each by Intrauterine route once.    . mesalamine (LIALDA) 1.2 g EC tablet Take 2 tablets (2.4 g total) by mouth daily after supper. 180 tablet 3  . mesalamine (LIALDA) 1.2 g EC tablet TAKE 2 TABLETS(2.4 GRAMS) BY MOUTH DAILY 60 tablet 6  . ondansetron (ZOFRAN ODT) 4 MG disintegrating tablet Take 1 tablet (4 mg total) by mouth every 8 (eight) hours as needed. 10 tablet 0  . oxyCODONE-acetaminophen (PERCOCET/ROXICET) 5-325 MG tablet Take 2 tablets by mouth every 4 (four) hours as needed for severe pain. 15 tablet 0  . Probiotic Product (UP4 PROBIOTICS ADULT) CAPS Take 1 tablet by mouth 2 (two) times daily. 30 capsule 0  . rivaroxaban (XARELTO) 20 MG TABS tablet TAKE 1 TABLET(20 MG) BY MOUTH DAILY WITH DINNER 90 tablet 3  . valACYclovir (VALTREX) 1000 MG tablet Take 1 tablet (1,000 mg total) by mouth 3 (three) times daily. 21 tablet 0   No current facility-administered medications on file prior to visit.     Review of Systems:  As per HPI- otherwise negative.   Physical Examination: Vitals:   07/20/18 1623  BP: 110/68  Pulse: 82  Resp: 16  Temp: 98.2 F (36.8 C)  SpO2: 98%   Vitals:   07/20/18 1623  Weight: 117 lb (53.1 kg)  Height: 5' 4"  (1.626 m)   Body mass index is 20.08 kg/m. Ideal Body Weight: Weight in (lb) to have BMI = 25: 145.3  GEN: WDWN, NAD, Non-toxic, A & O x 3, slim build, looks well. HEENT: Atraumatic, Normocephalic. Neck supple. No masses, No LAD.Bilateral TM wnl, oropharynx normal.  PEERL,EOMI.   The right cheek is slightly  puffy and erythematous.  It is tender to the touch.  She also notes tenderness with movement of her eyes. Limited funduscopic exam is normal. There is mild injection of the right conjunctive I. Normal motion of her face, but she does note decreased sensation over the area that which was affected by her shingles rash Ears and Nose: No external deformity. CV: RRR, No M/G/R. No JVD. No thrill. No extra heart sounds.  PULM: CTA B, no wheezes, crackles, rhonchi. No retractions. No resp. distress. No accessory muscle use. EXTR: No c/c/e NEURO Normal gait.  Normal strength and sensation of all limbs, normal deep tendon reflex, normal gait PSYCH: Normally interactive. Conversant. Not depressed or anxious appearing.  Calm demeanor.    Assessment and Plan: Cellulitis, face - Plan: CT Maxillofacial W/Cm, amoxicillin-clavulanate (AUGMENTIN) 875-125 MG tablet, clindamycin (CLEOCIN) 300 MG capsule  Herpes zoster with ophthalmic complication, unspecified herpes zoster eye disease  Doria is a very nice lady who unfortunately had a severe shingles outbreak on her right face earlier this month.   Her rash is much better, but she sought care today as she has been concerned about numbness in her face.  The numbness is located in the distribution of her rash, so suspect due to continued nerve malfunction.  However she also has redness and mild erythema, edema of the right cheek, and pain with eye movement.  Consider orbital vs pre-septal cellulitis.  She would be at risk for cellulitis given her recent severe facial skin blistering. Will send her for a CT scan now, to evaluate.  Assuming no orbital cellulitis is visualized, plan to treat with clindamycin and Augmentin as recommended by up-to-date  Signed Lamar Blinks, MD  Ct Maxillofacial W/cm  Result Date: 07/20/2018 CLINICAL DATA:  47 y/o F; right-sided facial goals. Numbness to the right top lip and hairline of right face. EXAM: CT MAXILLOFACIAL WITH  CONTRAST TECHNIQUE: Multidetector CT imaging of the maxillofacial structures was performed with intravenous contrast. Multiplanar CT image reconstructions were also generated. CONTRAST:  48m ISOVUE-300 IOPAMIDOL (ISOVUE-300) INJECTION 61% COMPARISON:  None. FINDINGS: Osseous: No fracture or mandibular dislocation. No destructive process. Orbits: Negative. No traumatic or inflammatory finding. Sinuses: Clear. Borderline high-riding right jugular bulb with thin sigmoid plate. Soft tissues: No mass or fluid collection. Mildly enhancing parotid lymph nodes, likely reactive. Limited intracranial: No significant or unexpected finding. IMPRESSION: No acute process identified. No inflammatory finding of the orbital compartments. Mildly enhancing parotid lymph nodes, likely reactive. Electronically Signed   By: LKristine GarbeM.D.   On: 07/20/2018 18:28   Received her CT and called her to discuss.  No sign of orbital cellulitis.  Will treat for facial cellulitis as described above.  Gave her a 10-day supply of medication, but advised that she can likely take it for just 7 days.  I have asked her to see her eye doctor tomorrow before the weekend, and she agrees to do so.  We did discuss further imaging of the brain given her facial numbness.  However at this time we both feel comfortable that this is likely due to shingles, as she has no other neurologic signs or symptoms.  Defer further imaging at this time  She is asked to keep me updated on her condition, and she agrees to do so

## 2018-07-20 ENCOUNTER — Ambulatory Visit (HOSPITAL_BASED_OUTPATIENT_CLINIC_OR_DEPARTMENT_OTHER)
Admission: RE | Admit: 2018-07-20 | Discharge: 2018-07-20 | Disposition: A | Discharge status: Home / Self Care | Payer: Commercial Managed Care - PPO | Source: Ambulatory Visit | Attending: Family Medicine | Admitting: Family Medicine

## 2018-07-20 ENCOUNTER — Encounter: Payer: Self-pay | Admitting: Family Medicine

## 2018-07-20 ENCOUNTER — Ambulatory Visit (INDEPENDENT_AMBULATORY_CARE_PROVIDER_SITE_OTHER): Payer: Commercial Managed Care - PPO | Admitting: Family Medicine

## 2018-07-20 ENCOUNTER — Encounter (HOSPITAL_BASED_OUTPATIENT_CLINIC_OR_DEPARTMENT_OTHER): Payer: Self-pay

## 2018-07-20 VITALS — BP 110/68 | HR 82 | Temp 98.2°F | Resp 16 | Ht 64.0 in | Wt 117.0 lb

## 2018-07-20 DIAGNOSIS — L03211 Cellulitis of face: Secondary | ICD-10-CM

## 2018-07-20 DIAGNOSIS — B0230 Zoster ocular disease, unspecified: Secondary | ICD-10-CM | POA: Diagnosis not present

## 2018-07-20 DIAGNOSIS — R200 Anesthesia of skin: Secondary | ICD-10-CM | POA: Diagnosis not present

## 2018-07-20 MED ORDER — CLINDAMYCIN HCL 300 MG PO CAPS: 300.0000 mg | ORAL_CAPSULE | Freq: Three times a day (TID) | ORAL | 0 refills | Status: DC

## 2018-07-20 MED ORDER — OXYCODONE-ACETAMINOPHEN 5-325 MG PO TABS: 2.0000 | ORAL_TABLET | ORAL | 0 refills | Status: DC | PRN

## 2018-07-20 MED ORDER — AMOXICILLIN-POT CLAVULANATE 875-125 MG PO TABS: 1.0000 | ORAL_TABLET | Freq: Two times a day (BID) | ORAL | 0 refills | Status: DC

## 2018-07-20 MED ORDER — IOPAMIDOL (ISOVUE-300) INJECTION 61%
100.0000 mL | Freq: Once | INTRAVENOUS | Status: AC | PRN
  Administered 2018-07-20: 75 mL via INTRAVENOUS

## 2018-07-20 NOTE — Patient Instructions (Addendum)
I will call you with your CT report later tonight.  Our plan will probably be to treat you for cellulits of the face with both augmentin and clindamycin for 7 days. I gave you enough for 10 days just in case

## 2018-07-22 MED ORDER — VALACYCLOVIR HCL 1 G PO TABS: 1000.0000 mg | ORAL_TABLET | Freq: Three times a day (TID) | ORAL | 0 refills | Status: DC

## 2018-07-23 MED ORDER — ONDANSETRON 4 MG PO TBDP: 4.0000 mg | ORAL_TABLET | Freq: Three times a day (TID) | ORAL | 0 refills | Status: DC | PRN

## 2018-07-23 NOTE — Addendum Note (Signed)
Addended by: Lamar Blinks C on: 07/23/2018 08:08 PM   Modules accepted: Orders

## 2018-07-24 DIAGNOSIS — H2513 Age-related nuclear cataract, bilateral: Secondary | ICD-10-CM | POA: Diagnosis not present

## 2018-07-24 DIAGNOSIS — B0239 Other herpes zoster eye disease: Secondary | ICD-10-CM | POA: Diagnosis not present

## 2018-07-25 ENCOUNTER — Telehealth: Payer: Self-pay | Admitting: *Deleted

## 2018-07-25 NOTE — Telephone Encounter (Signed)
Received pt eye exam post Shingles from [facility]; forwarded to provider/SLS 12/24

## 2018-07-28 DIAGNOSIS — B0239 Other herpes zoster eye disease: Secondary | ICD-10-CM | POA: Diagnosis not present

## 2018-07-28 DIAGNOSIS — H2513 Age-related nuclear cataract, bilateral: Secondary | ICD-10-CM | POA: Diagnosis not present

## 2018-08-01 ENCOUNTER — Encounter: Payer: Self-pay | Admitting: Family Medicine

## 2018-08-01 MED ORDER — GABAPENTIN 300 MG PO CAPS: 300.0000 mg | ORAL_CAPSULE | Freq: Three times a day (TID) | ORAL | 0 refills | Status: DC

## 2018-08-01 NOTE — Progress Notes (Signed)
Eye exam 07/21/2018. See report in chart.

## 2018-08-01 NOTE — Addendum Note (Signed)
Addended by: Lamar Blinks C on: 08/01/2018 04:48 PM   Modules accepted: Orders

## 2018-09-01 ENCOUNTER — Other Ambulatory Visit: Payer: Self-pay | Admitting: Family Medicine

## 2018-09-21 ENCOUNTER — Encounter: Payer: Self-pay | Admitting: Family Medicine

## 2018-09-21 ENCOUNTER — Telehealth: Payer: Self-pay

## 2018-09-21 DIAGNOSIS — Z1239 Encounter for other screening for malignant neoplasm of breast: Secondary | ICD-10-CM

## 2018-09-21 NOTE — Telephone Encounter (Signed)
Copied from [facility] [phone]. Topic: Quick Communication - See Telephone Encounter >> Sep 21, 2018  2:51 PM Percell Belt A wrote: CRM for notification. See Telephone encounter for: 09/21/18.  Pt called in and stated that she would like Dr Lorelei Pont to put in order for her year mammogram [facility]     Best number  [phone]

## 2018-09-21 NOTE — Addendum Note (Signed)
Addended by: Lamar Blinks C on: 09/21/2018 04:49 PM   Modules accepted: Orders

## 2018-10-07 ENCOUNTER — Ambulatory Visit (HOSPITAL_BASED_OUTPATIENT_CLINIC_OR_DEPARTMENT_OTHER)
Admission: RE | Admit: 2018-10-07 | Discharge: 2018-10-07 | Disposition: A | Discharge status: Home / Self Care | Payer: Commercial Managed Care - PPO | Source: Ambulatory Visit | Attending: Family Medicine | Admitting: Family Medicine

## 2018-10-07 DIAGNOSIS — Z1231 Encounter for screening mammogram for malignant neoplasm of breast: Secondary | ICD-10-CM | POA: Insufficient documentation

## 2018-10-07 DIAGNOSIS — Z1239 Encounter for other screening for malignant neoplasm of breast: Secondary | ICD-10-CM

## 2018-11-06 ENCOUNTER — Encounter: Payer: Self-pay | Admitting: Family Medicine

## 2019-01-04 ENCOUNTER — Other Ambulatory Visit: Payer: Self-pay | Admitting: Internal Medicine

## 2019-02-21 ENCOUNTER — Other Ambulatory Visit: Payer: Self-pay

## 2019-02-21 ENCOUNTER — Telehealth: Payer: Self-pay | Admitting: Internal Medicine

## 2019-02-21 DIAGNOSIS — D689 Coagulation defect, unspecified: Secondary | ICD-10-CM

## 2019-02-21 MED ORDER — GABAPENTIN 300 MG PO CAPS: ORAL_CAPSULE | ORAL | 1 refills | Status: DC

## 2019-02-21 MED ORDER — RIVAROXABAN 20 MG PO TABS: ORAL_TABLET | ORAL | 3 refills | Status: DC

## 2019-02-21 NOTE — Telephone Encounter (Signed)
Pt is trying to have prescription for Lialda transferred from [facility] to [facility] and would like to know why she needs an OV for future refills.

## 2019-02-22 MED ORDER — MESALAMINE 1.2 G PO TBEC: 2.4000 g | DELAYED_RELEASE_TABLET | Freq: Every day | ORAL | 0 refills | Status: DC

## 2019-02-22 NOTE — Telephone Encounter (Signed)
Patient advised that she is told she needs office visit for future refills because when we give her a 3 month supply, that will put [place] into October. She is due for office visit in November (1 year since last visit). If we wait until October to tell her she needs a visit, our schedule will likely be full and she will not be able to get in until much later than we would like. Patient verbalizes understanding.  Her rx is sent to [facility].

## 2019-04-01 ENCOUNTER — Other Ambulatory Visit: Payer: Self-pay | Admitting: Internal Medicine

## 2019-04-17 ENCOUNTER — Other Ambulatory Visit: Payer: Self-pay | Admitting: Internal Medicine

## 2019-04-20 ENCOUNTER — Telehealth: Payer: Self-pay | Admitting: Internal Medicine

## 2019-04-20 MED ORDER — MESALAMINE 1.2 G PO TBEC: 2.4000 g | DELAYED_RELEASE_TABLET | Freq: Every day | ORAL | 0 refills | Status: DC

## 2019-04-20 NOTE — Telephone Encounter (Signed)
Rx sent 

## 2019-04-20 NOTE — Telephone Encounter (Signed)
Pt is scheduled for OV with Ellouise Newer 05/03/19 and requested a refill for Lialda.

## 2019-05-03 ENCOUNTER — Ambulatory Visit (INDEPENDENT_AMBULATORY_CARE_PROVIDER_SITE_OTHER): Payer: No Typology Code available for payment source | Admitting: Physician Assistant

## 2019-05-03 ENCOUNTER — Other Ambulatory Visit (INDEPENDENT_AMBULATORY_CARE_PROVIDER_SITE_OTHER): Payer: No Typology Code available for payment source

## 2019-05-03 ENCOUNTER — Encounter: Payer: Self-pay | Admitting: Physician Assistant

## 2019-05-03 ENCOUNTER — Other Ambulatory Visit: Payer: Self-pay

## 2019-05-03 VITALS — BP 100/60 | HR 89 | Temp 98.6°F | Ht 64.0 in | Wt 119.0 lb

## 2019-05-03 DIAGNOSIS — K5100 Ulcerative (chronic) pancolitis without complications: Secondary | ICD-10-CM

## 2019-05-03 LAB — COMPREHENSIVE METABOLIC PANEL
ALT: 32 U/L (ref 0–35)
AST: 23 U/L (ref 0–37)
Albumin: 4.4 g/dL (ref 3.5–5.2)
Alkaline Phosphatase: 56 U/L (ref 39–117)
BUN: 16 mg/dL (ref 6–23)
CO2: 29 mEq/L (ref 19–32)
Calcium: 9.5 mg/dL (ref 8.4–10.5)
Chloride: 101 mEq/L (ref 96–112)
Creatinine, Ser: 0.81 mg/dL (ref 0.40–1.20)
GFR: 75.91 mL/min (ref >60.00)
Glucose, Bld: 143 mg/dL — ABNORMAL HIGH (ref 70–99)
Potassium: 3.8 mEq/L (ref 3.5–5.1)
Sodium: 137 mEq/L (ref 135–145)
Total Bilirubin: 0.4 mg/dL (ref 0.2–1.2)
Total Protein: 7.1 g/dL (ref 6.0–8.3)

## 2019-05-03 LAB — CBC WITH DIFFERENTIAL/PLATELET
Basophils Absolute: 0.1 10*3/uL (ref 0.0–0.1)
Basophils Relative: 1.1 % (ref 0.0–3.0)
Eosinophils Absolute: 0.2 10*3/uL (ref 0.0–0.7)
Eosinophils Relative: 3.1 % (ref 0.0–5.0)
HCT: 41.2 % (ref 36.0–46.0)
Hemoglobin: 14 g/dL (ref 12.0–15.0)
Lymphocytes Relative: 26.2 % (ref 12.0–46.0)
Lymphs Abs: 2 10*3/uL (ref 0.7–4.0)
MCHC: 34 g/dL (ref 30.0–36.0)
MCV: 90.1 fl (ref 78.0–100.0)
Monocytes Absolute: 0.5 10*3/uL (ref 0.1–1.0)
Monocytes Relative: 6.6 % (ref 3.0–12.0)
Neutro Abs: 4.7 10*3/uL (ref 1.4–7.7)
Neutrophils Relative %: 63 % (ref 43.0–77.0)
Platelets: 187 10*3/uL (ref 150.0–400.0)
RBC: 4.58 Mil/uL (ref 3.87–5.11)
RDW: 12.6 % (ref 11.5–15.5)
WBC: 7.5 10*3/uL (ref 4.0–10.5)

## 2019-05-03 MED ORDER — MESALAMINE 1.2 G PO TBEC: DELAYED_RELEASE_TABLET | ORAL | 3 refills | Status: DC

## 2019-05-03 NOTE — Patient Instructions (Signed)
We have sent the following prescriptions to your mail in pharmacy:  [facility]  If you have not heard from your mail in pharmacy within 1 week or if you have not received your medication in the mail, please contact us at [phone] so we may find out why.    Your provider has requested that you go to the basement level for lab work before leaving today. Press "B" on the elevator. The lab is located at the first door on the left as you exit the elevator.   Follow up with Dr Hilarie Fredrickson in a year or sooner if needed.   I appreciate the opportunity to care for you. Ellouise Newer, PA-C

## 2019-05-03 NOTE — Progress Notes (Signed)
Chief Complaint: Follow-up pan ulcerative colitis  HPI:    [name] is a 48 year old Caucasian female with a past medical history of pan ulcerative colitis diagnosed in 2003, history of DVT and PE on Xarelto, rheumatoid arthritis who normally follows with Dr. Hilarie Fredrickson and presents clinic today for medication refill.    06/21/2018 colonoscopy with mildly to moderately active pancolitis ulcerative colitis most prominent in the right colon, worsened since last exam and small internal hemorrhoids.  Patient was started back on Lialda.    Today, the patient tells me that she was doing wonderful she has regular daily bowel movements that are soft solid and no abdominal pain or bleeding.  She is currently using her Lialda 2.4 g/day.    Social history positive for being a [employer].    Denies any complaints or concerns.  Past Medical History:  Diagnosis Date  . Abnormal LFTs   . Esophagitis   . History of blood clots     B leg DVT 2005, abdomen. On Coumadin for life  . History of blood transfusion    after reaction to 6-MP  . Long term (current) use of anticoagulants   . Pulmonary embolism ([place])   . RA (rheumatoid arthritis) ([facility])    ~2005  . Ulcerative colitis    dx  around 2004    Past Surgical History:  Procedure Laterality Date  . vascular stents bilateral groins  2006    Current Outpatient Medications  Medication Sig Dispense Refill  . gabapentin (NEURONTIN) 300 MG capsule TAKE 1 TO 2 CAPSULES(300 TO 600 MG) BY MOUTH THREE TIMES DAILY 180 capsule 1  . levonorgestrel (MIRENA) 20 MCG/24HR IUD 1 each by Intrauterine route once.    . mesalamine (LIALDA) 1.2 g EC tablet TAKE 2 TABLETS(2.4 GRAMS) BY MOUTH DAILY 180 tablet 3  . rivaroxaban (XARELTO) 20 MG TABS tablet TAKE 1 TABLET(20 MG) BY MOUTH DAILY WITH DINNER 90 tablet 3   No current facility-administered medications for this visit.     Allergies as of 05/03/2019 - Review Complete 05/03/2019  Allergen Reaction Noted   . Mercaptopurine Nausea And Vomiting 07/26/2011  . Sulfa antibiotics Hives 11/03/2010    Family History  Problem Relation Age of Onset  . Colon cancer Paternal Grandmother   . Colon polyps Paternal Grandmother   . Breast cancer Mother   . Deep vein thrombosis Father        between lung and heart  . CAD Neg Hx   . Clotting disorder Neg Hx   . Stomach cancer Neg Hx   . Esophageal cancer Neg Hx     Social History   Socioeconomic History  . Marital status: Married    Spouse name: Not on file  . Number of children: 1  . Years of education: Not on file  . Highest education level: Not on file  Occupational History  . 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 Financial resource strain: Not on file  . Food insecurity    Worry: Not on file    Inability: Not on file  . Transportation needs    Medical: Not on file    Non-medical: Not on file  Tobacco Use  . Smoking status: Never Smoker  . Smokeless tobacco: Never Used  . Tobacco comment: Never Used Tobacco  Substance and Sexual Activity  . Alcohol use: Yes    Alcohol/week: 7.0 standard drinks    Types: 7 Glasses of wine per  week  . Drug use: No  . Sexual activity: Yes    Partners: Male    Birth control/protection: I.U.D.  Lifestyle  . Physical activity    Days per week: Not on file    Minutes per session: Not on file  . Stress: Not on file  Relationships  . Social Herbalist on phone: Not on file    Gets together: Not on file    Attends religious service: Not on file    Active member of club or organization: Not on file    Attends meetings of clubs or organizations: Not on file    Relationship status: Not on file  . Intimate partner violence    Fear of current or ex partner: Not on file    Emotionally abused: Not on file    Physically abused: Not on file    Forced sexual activity: Not on file  Other Topics Concern  . 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 Lungs clear to auscultation bilaterally.   No wheezes, crackles, or rhonchi.  Cardiovascular: Normal S1, S2. No MRG. Regular rate and rhythm. No peripheral edema, cyanosis or pallor.  Gastrointestinal:  Soft, nondistended, nontender. No rebound or guarding. Normal bowel sounds. No appreciable masses or hepatomegaly. Psychiatric: Demonstrates good judgement and reason without abnormal affect or behaviors.  NO recent labs or imaging.  Assessment: 1.  Ulcerative pancolitis: Controlled on Lialda 2.4 g/day,  Last colonoscopy 06/21/2018  Plan: 1.  Ordered repeat CBC and CMP 2.  Refilled Lialda 2.4 g/day for a 90-day prescription refills x3 3.  Patient return to clinic in a year for further refills  Ellouise Newer, PA-C [facility] 05/03/2019, 4:25 PM  Cc: Copland, Gay Filler, MD

## 2019-05-04 ENCOUNTER — Telehealth: Payer: Self-pay | Admitting: Physician Assistant

## 2019-05-04 NOTE — Progress Notes (Signed)
Addendum: Reviewed and agree with assessment and management plan. Bralee Feldt M, MD  

## 2019-05-04 NOTE — Telephone Encounter (Signed)
Pt called stating that she was returning your call.

## 2019-05-04 NOTE — Telephone Encounter (Signed)
See phone note

## 2019-06-03 NOTE — Progress Notes (Addendum)
Annette Hopkins [address] [place], [place] [address] [phone]  Date:  06/11/2019   Name:  Annette Hopkins   DOB:  1970/08/16   MRN:  [id number]  PCP:  Darreld Mclean, MD    Chief Complaint: Annual Exam   History of Present Illness:  Annette Hopkins is a 48 y.o. very pleasant female patient who presents with the following:  Here today for physical exam Patient with history of rheumatoid arthritis, coagulation defect, history of DVT and PE, ulcerative colitis, melanoma Last seen by myself last December with severe shingles on her right face Unfortunately she continues to have residual neuropathy symptoms, with pain, stinging, and numbness on the right side of her face. She is still using gabapentin for her neuropathy- I will refer her to neurology for a consultation She had to stop drinking wine as this increases her pain for some reason  She saw her gastroenterology PA about a month ago, her primary gastroenterologist is Dr. 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  She is glad to be at school teaching in person They are doing ok at [employer] so far this year, few cases of Covid  Married to Annette Hopkins, 44 yo daughter -her daughter is having a hard time with lack of social interaction due to COVID-19  Flu shot- give today  Mammogram up-to-date Pap up-to-date Colonoscopy up-to-date  No bleeding or bruising with Xarelto  Recent CMP and CBC on chart per gastroenterology However no A1c, lipids, TSH, HIV screening She would like a covid ab test if possible-she notes exposure to illness earlier this year  She feels like she gets hypoglycemic if she does  not eat every few hours-she tends to need something to eat every 2 or 3 hours Her weight is stable She has always had this tendenecy toward symptomatic hypoglycemia but worse than usual lately Admits that her diet is generally low-fat, as her husband tends to struggle more with his weight and fat intake  Wt Readings from Last 3 Encounters:  06/11/19 119 lb (54 kg)  05/03/19 119 lb (54 kg)  07/20/18 117 lb (53.1 kg)    Patient Active Problem List   Diagnosis Date Noted  . Shingles 07/04/2018  . Melanoma ([place]) 02/20/2013  . Headache(784.0) 04/20/2012  . Osteopenia 02/21/2012  . GERD (gastroesophageal reflux disease) 02/17/2011  . Coagulation defect ([place]) 02/16/2011  . Transaminitis 11/11/2010  . Ulcerative colitis   . History of blood clots   . RA (rheumatoid arthritis) ([place])   . Polycystic ovaries 07/31/2010    Past Medical History:  Diagnosis Date  . Abnormal LFTs   . Esophagitis   . History of blood clots     B leg DVT 2005, abdomen. On Coumadin for life  . History of blood transfusion    after reaction to 6-MP  . Long term (current) use of anticoagulants   . Pulmonary embolism ([place])   . RA (rheumatoid arthritis) ([facility])    ~2005  . Ulcerative colitis    dx  around 2004    Past Surgical History:  Procedure Laterality Date  . vascular stents bilateral groins  2006  Social History   Tobacco Use  . Smoking status: Never Smoker  . Smokeless tobacco: Never Used  . Tobacco comment: Never Used Tobacco  Substance Use Topics  . Alcohol use: Yes    Alcohol/week: 7.0 standard drinks    Types: 7 Glasses of wine per week  . Drug use: No    Family History  Problem Relation Age of Onset  . Colon cancer Paternal Grandmother   . Colon polyps Paternal Grandmother   . Breast cancer Mother   . Deep vein thrombosis Father        between lung and heart  . CAD Neg Hx   . Clotting disorder Neg Hx   . Stomach cancer Neg Hx   . Esophageal cancer Neg Hx     Allergies   Allergen Reactions  . Mercaptopurine Nausea And Vomiting  . Sulfa Antibiotics Hives    Medication list has been reviewed and updated.  Current Outpatient Medications on File Prior to Visit  Medication Sig Dispense Refill  . gabapentin (NEURONTIN) 300 MG capsule TAKE 1 TO 2 CAPSULES(300 TO 600 MG) BY MOUTH THREE TIMES DAILY 180 capsule 1  . levonorgestrel (MIRENA) 20 MCG/24HR IUD 1 each by Intrauterine route once.    . mesalamine (LIALDA) 1.2 g EC tablet TAKE 2 TABLETS(2.4 GRAMS) BY MOUTH DAILY 180 tablet 3  . rivaroxaban (XARELTO) 20 MG TABS tablet TAKE 1 TABLET(20 MG) BY MOUTH DAILY WITH DINNER 90 tablet 3   No current facility-administered medications on file prior to visit.     Review of Systems:  As per HPI- otherwise negative. Exercises daily with yoga, no CP or SOB She is now working out at home due to pandemic   Physical Examination: Vitals:   06/11/19 1030  BP: 106/60  Pulse: 81  Resp: 16  Temp: (!) 97.5 F (36.4 C)  SpO2: 98%   Vitals:   06/11/19 1030  Weight: 119 lb (54 kg)  Height: 5' 4"  (1.626 m)   Body mass index is 20.43 kg/m. Ideal Body Weight: Weight in (lb) to have BMI = 25: 145.3  GEN: WDWN, NAD, Non-toxic, A & O x 3, slim build, looks well  HEENT: Atraumatic, Normocephalic. Neck supple. No masses, No LAD.  Bilateral TM wnl, oropharynx normal.  PEERL,EOMI.   Ears and Nose: No external deformity. CV: RRR, No M/G/R. No JVD. No thrill. No extra heart sounds. PULM: CTA B, no wheezes, crackles, rhonchi. No retractions. No resp. distress. No accessory muscle use.   ABD: S, NT, ND, +BS. No rebound. No HSM. EXTR: No c/c/e NEURO Normal gait.  PSYCH: Normally interactive. Conversant. Not depressed or anxious appearing.  Calm demeanor.    Assessment and Plan: Physical exam  Screening for deficiency anemia  Screening for diabetes mellitus - Plan: Hemoglobin A1c  Screening for hyperlipidemia - Plan: Lipid panel  History of melanoma  History of  blood clots  Ulcerative colitis without complications, unspecified location ([place])  Screening for HIV (human immunodeficiency virus) - Plan: HIV Antibody (routine testing w rflx)  Screening for thyroid disorder - Plan: TSH  Post herpetic neuralgia - Plan: Ambulatory referral to Neurology  Encounter for screening laboratory testing for COVID-19 virus - Plan: SAR CoV2 Serology (COVID 19)AB(IGG)IA  Needs flu shot - Plan: Flu Vaccine QUAD 6+ mos PF IM (Fluarix Quad PF)  Here today for physical exam. Routine blood work pending as above Designer, industrial/product for history of blood clots Flu shot given Covid antibody test as per patient request Referral  to neurology to discuss persistent postherpetic neuralgia Possible symptomatic hypoglycemia.  Encouraged her to add more good fats to her diet, this may help stabilize her blood sugar Check A1c and TSH  Signed Lamar Blinks, MD  Received her labs, message to patient  Results for orders placed or performed in visit on 06/11/19  Hemoglobin A1c  Result Value Ref Range   Hgb A1c MFr Bld 5.3 4.6 - 6.5 %  Lipid panel  Result Value Ref Range   Cholesterol 160 0 - 200 mg/dL   Triglycerides 98.0 0.0 - 149.0 mg/dL   HDL 56.20 >39.00 mg/dL   VLDL 19.6 0.0 - 40.0 mg/dL   LDL Cholesterol 84 0 - 99 mg/dL   Total CHOL/HDL Ratio 3    NonHDL 103.98   TSH  Result Value Ref Range   TSH 0.99 0.35 - 4.50 uIU/mL

## 2019-06-11 ENCOUNTER — Encounter: Payer: Self-pay | Admitting: Family Medicine

## 2019-06-11 ENCOUNTER — Other Ambulatory Visit: Payer: Self-pay

## 2019-06-11 ENCOUNTER — Ambulatory Visit (INDEPENDENT_AMBULATORY_CARE_PROVIDER_SITE_OTHER): Payer: No Typology Code available for payment source | Admitting: Family Medicine

## 2019-06-11 ENCOUNTER — Encounter: Payer: Self-pay | Admitting: Neurology

## 2019-06-11 VITALS — BP 106/60 | HR 81 | Temp 97.5°F | Resp 16 | Ht 64.0 in | Wt 119.0 lb

## 2019-06-11 DIAGNOSIS — Z0000 Encounter for general adult medical examination without abnormal findings: Secondary | ICD-10-CM | POA: Diagnosis not present

## 2019-06-11 DIAGNOSIS — Z13220 Encounter for screening for lipoid disorders: Secondary | ICD-10-CM

## 2019-06-11 DIAGNOSIS — Z130 Encounter for screening for diseases of the blood and blood-forming organs and certain disorders involving the immune mechanism: Secondary | ICD-10-CM

## 2019-06-11 DIAGNOSIS — Z1152 Encounter for screening for COVID-19: Secondary | ICD-10-CM

## 2019-06-11 DIAGNOSIS — Z85820 Personal history of malignant melanoma of skin: Secondary | ICD-10-CM

## 2019-06-11 DIAGNOSIS — Z131 Encounter for screening for diabetes mellitus: Secondary | ICD-10-CM | POA: Diagnosis not present

## 2019-06-11 DIAGNOSIS — Z20828 Contact with and (suspected) exposure to other viral communicable diseases: Secondary | ICD-10-CM

## 2019-06-11 DIAGNOSIS — Z20822 Contact with and (suspected) exposure to covid-19: Secondary | ICD-10-CM

## 2019-06-11 DIAGNOSIS — Z23 Encounter for immunization: Secondary | ICD-10-CM

## 2019-06-11 DIAGNOSIS — Z1329 Encounter for screening for other suspected endocrine disorder: Secondary | ICD-10-CM

## 2019-06-11 DIAGNOSIS — K5190 Ulcerative colitis, unspecified, without complications: Secondary | ICD-10-CM

## 2019-06-11 DIAGNOSIS — Z86718 Personal history of other venous thrombosis and embolism: Secondary | ICD-10-CM

## 2019-06-11 DIAGNOSIS — Z114 Encounter for screening for human immunodeficiency virus [HIV]: Secondary | ICD-10-CM

## 2019-06-11 DIAGNOSIS — B0229 Other postherpetic nervous system involvement: Secondary | ICD-10-CM

## 2019-06-11 LAB — LIPID PANEL
Cholesterol: 160 mg/dL (ref 0–200)
HDL: 56.2 mg/dL (ref >39.00)
LDL Cholesterol: 84 mg/dL (ref 0–99)
NonHDL: 103.98
Total CHOL/HDL Ratio: 3
Triglycerides: 98 mg/dL (ref 0.0–149.0)
VLDL: 19.6 mg/dL (ref 0.0–40.0)

## 2019-06-11 LAB — HEMOGLOBIN A1C: Hgb A1c MFr Bld: 5.3 % (ref 4.6–6.5)

## 2019-06-11 LAB — TSH: TSH: 0.99 u[IU]/mL (ref 0.35–4.50)

## 2019-06-11 NOTE — Patient Instructions (Signed)
Great to see you again today I will be in touch with your labs asap We will set you up for a neurology evaluation to discuss your persistent post shingles pain I would suggest adding a bit more fat to your diet to help combat symptoms of low blood sugar   Health Maintenance, Female Adopting a healthy lifestyle and getting preventive care are important in promoting health and wellness. Ask your health care provider about:  The right schedule for you to have regular tests and exams.  Things you can do on your own to prevent diseases and keep yourself healthy. What should I know about diet, weight, and exercise? Eat a healthy diet   Eat a diet that includes plenty of vegetables, fruits, low-fat dairy products, and lean protein.  Do not eat a lot of foods that are high in solid fats, added sugars, or sodium. Maintain a healthy weight Body mass index (BMI) is used to identify weight problems. It estimates body fat based on height and weight. Your health care provider can help determine your BMI and help you achieve or maintain a healthy weight. Get regular exercise Get regular exercise. This is one of the most important things you can do for your health. Most adults should:  Exercise for at least 150 minutes each week. The exercise should increase your heart rate and make you sweat (moderate-intensity exercise).  Do strengthening exercises at least twice a week. This is in addition to the moderate-intensity exercise.  Spend less time sitting. Even light physical activity can be beneficial. Watch cholesterol and blood lipids Have your blood tested for lipids and cholesterol at 45 years of age, then have this test every 5 years. Have your cholesterol levels checked more often if:  Your lipid or cholesterol levels are high.  You are older than 46 years of age.  You are at high risk for heart disease. What should I know about cancer screening? Depending on your health history and family  history, you may need to have cancer screening at various ages. This may include screening for:  Breast cancer.  Cervical cancer.  Colorectal cancer.  Skin cancer.  Lung cancer. What should I know about heart disease, diabetes, and high blood pressure? Blood pressure and heart disease  High blood pressure causes heart disease and increases the risk of stroke. This is more likely to develop in people who have high blood pressure readings, are of African descent, or are overweight.  Have your blood pressure checked: ? Every 3-5 years if you are 14-36 years of age. ? Every year if you are 69 years old or older. Diabetes Have regular diabetes screenings. This checks your fasting blood sugar level. Have the screening done:  Once every three years after age 54 if you are at a normal weight and have a low risk for diabetes.  More often and at a younger age if you are overweight or have a high risk for diabetes. What should I know about preventing infection? Hepatitis B If you have a higher risk for hepatitis B, you should be screened for this virus. Talk with your health care provider to find out if you are at risk for hepatitis B infection. Hepatitis C Testing is recommended for:  Everyone born from [birth year] through 1963.  Anyone with known risk factors for hepatitis C. Sexually transmitted infections (STIs)  Get screened for STIs, including gonorrhea and chlamydia, if: ? You are sexually active and are younger than 46 years of age. ?  You are older than 46 years of age and your health care provider tells you that you are at risk for this type of infection. ? Your sexual activity has changed since you were last screened, and you are at increased risk for chlamydia or gonorrhea. Ask your health care provider if you are at risk.  Ask your health care provider about whether you are at high risk for HIV. Your health care provider may recommend a prescription medicine to help prevent HIV  infection. If you choose to take medicine to prevent HIV, you should first get tested for HIV. You should then be tested every 3 months for as long as you are taking the medicine. Pregnancy  If you are about to stop having your period (premenopausal) and you may become pregnant, seek counseling before you get pregnant.  Take 400 to 800 micrograms (mcg) of folic acid every day if you become pregnant.  Ask for birth control (contraception) if you want to prevent pregnancy. Osteoporosis and menopause Osteoporosis is a disease in which the bones lose minerals and strength with aging. This can result in bone fractures. If you are 12 years old or older, or if you are at risk for osteoporosis and fractures, ask your health care provider if you should:  Be screened for bone loss.  Take a calcium or vitamin D supplement to lower your risk of fractures.  Be given hormone replacement therapy (HRT) to treat symptoms of menopause. Follow these instructions at home: Lifestyle  Do not use any products that contain nicotine or tobacco, such as cigarettes, e-cigarettes, and chewing tobacco. If you need help quitting, ask your health care provider.  Do not use street drugs.  Do not share needles.  Ask your health care provider for help if you need support or information about quitting drugs. Alcohol use  Do not drink alcohol if: ? Your health care provider tells you not to drink. ? You are pregnant, may be pregnant, or are planning to become pregnant.  If you drink alcohol: ? Limit how much you use to 0-1 drink a day. ? Limit intake if you are breastfeeding.  Be aware of how much alcohol is in your drink. In the [place] one drink equals one 12 oz bottle of beer (355 mL), one 5 oz glass of wine (148 mL), or one 1 oz glass of hard liquor (44 mL). General instructions  Schedule regular health, dental, and eye exams.  Stay current with your vaccines.  Tell your health care provider if: ? You  often feel depressed. ? You have ever been abused or do not feel safe at home. Summary  Adopting a healthy lifestyle and getting preventive care are important in promoting health and wellness.  Follow your health care provider's instructions about healthy diet, exercising, and getting tested or screened for diseases.  Follow your health care provider's instructions on monitoring your cholesterol and blood pressure. This information is not intended to replace advice given to you by your health care provider. Make sure you discuss any questions you have with your health care provider. Document Released: 02/01/2011 Document Revised: 07/12/2018 Document Reviewed: 07/12/2018 Elsevier Patient Education  2020 [employer].

## 2019-06-12 LAB — SAR COV2 SEROLOGY (COVID19)AB(IGG),IA: SARS CoV2 AB IGG: NEGATIVE

## 2019-06-12 LAB — HIV ANTIBODY (ROUTINE TESTING W REFLEX): HIV 1&2 Ab, 4th Generation: NONREACTIVE

## 2019-06-17 ENCOUNTER — Other Ambulatory Visit: Payer: Self-pay | Admitting: Family Medicine

## 2019-06-17 DIAGNOSIS — D689 Coagulation defect, unspecified: Secondary | ICD-10-CM

## 2019-07-09 ENCOUNTER — Ambulatory Visit: Payer: No Typology Code available for payment source | Admitting: Neurology

## 2019-07-24 ENCOUNTER — Encounter: Payer: Self-pay | Admitting: Neurology

## 2019-08-10 ENCOUNTER — Ambulatory Visit: Payer: No Typology Code available for payment source | Admitting: Neurology

## 2019-08-11 ENCOUNTER — Other Ambulatory Visit: Payer: Self-pay | Admitting: Internal Medicine

## 2019-08-27 ENCOUNTER — Ambulatory Visit: Payer: No Typology Code available for payment source | Admitting: Family Medicine

## 2019-09-29 ENCOUNTER — Ambulatory Visit: Payer: No Typology Code available for payment source | Attending: Internal Medicine

## 2019-09-29 DIAGNOSIS — Z23 Encounter for immunization: Secondary | ICD-10-CM | POA: Insufficient documentation

## 2019-09-29 NOTE — Progress Notes (Signed)
   Covid-19 Vaccination Clinic  Name:  Annette Hopkins    MRN: [id number] DOB: [birth year]  09/29/2019  [name] was observed post Covid-19 immunization for 15 minutes without incidence. She was provided with Vaccine Information Sheet and instruction to access the V-Safe system.   [name] was instructed to call 911 with any severe reactions post vaccine: Marland Kitchen Difficulty breathing  . Swelling of your face and throat  . A fast heartbeat  . A bad rash all over your body  . Dizziness and weakness    Immunizations Administered    Name Date Dose VIS Date Route   Pfizer COVID-19 Vaccine 09/29/2019 11:31 AM 0.3 mL 07/13/2019 Intramuscular   Manufacturer: [place]   Lot: [id number]   [place]: [id number]

## 2019-10-20 ENCOUNTER — Ambulatory Visit: Payer: No Typology Code available for payment source | Attending: Internal Medicine

## 2019-10-20 DIAGNOSIS — Z23 Encounter for immunization: Secondary | ICD-10-CM

## 2019-10-20 NOTE — Progress Notes (Signed)
   Covid-19 Vaccination Clinic  Name:  Annette Hopkins    MRN: [id number] DOB: [birth year]  10/20/2019  [name] was observed post Covid-19 immunization for 15 minutes without incident. She was provided with Vaccine Information Sheet and instruction to access the V-Safe system.   [name] was instructed to call 911 with any severe reactions post vaccine: Marland Kitchen Difficulty breathing  . Swelling of face and throat  . A fast heartbeat  . A bad rash all over body  . Dizziness and weakness   Immunizations Administered    Name Date Dose VIS Date Route   Pfizer COVID-19 Vaccine 10/20/2019 12:28 PM 0.3 mL 07/13/2019 Intramuscular   Manufacturer: [place]   Lot: [id number]   [place]: [id number]

## 2019-10-24 ENCOUNTER — Ambulatory Visit: Payer: No Typology Code available for payment source

## 2020-01-11 ENCOUNTER — Other Ambulatory Visit (HOSPITAL_BASED_OUTPATIENT_CLINIC_OR_DEPARTMENT_OTHER): Payer: Self-pay | Admitting: Family Medicine

## 2020-01-11 DIAGNOSIS — Z1231 Encounter for screening mammogram for malignant neoplasm of breast: Secondary | ICD-10-CM

## 2020-01-16 ENCOUNTER — Ambulatory Visit (HOSPITAL_BASED_OUTPATIENT_CLINIC_OR_DEPARTMENT_OTHER)
Admission: RE | Admit: 2020-01-16 | Discharge: 2020-01-16 | Disposition: A | Discharge status: Home / Self Care | Payer: No Typology Code available for payment source | Source: Ambulatory Visit | Attending: Diagnostic Radiology | Admitting: Diagnostic Radiology

## 2020-01-16 ENCOUNTER — Other Ambulatory Visit: Payer: Self-pay

## 2020-01-16 DIAGNOSIS — Z1231 Encounter for screening mammogram for malignant neoplasm of breast: Secondary | ICD-10-CM | POA: Insufficient documentation

## 2020-02-16 ENCOUNTER — Other Ambulatory Visit: Payer: Self-pay | Admitting: Family Medicine

## 2020-02-16 DIAGNOSIS — D689 Coagulation defect, unspecified: Secondary | ICD-10-CM

## 2020-05-21 ENCOUNTER — Encounter: Payer: Self-pay | Admitting: Family Medicine

## 2020-05-21 DIAGNOSIS — D049 Carcinoma in situ of skin, unspecified: Secondary | ICD-10-CM | POA: Insufficient documentation

## 2020-06-04 ENCOUNTER — Other Ambulatory Visit: Payer: Self-pay | Admitting: Physician Assistant

## 2020-06-11 NOTE — Patient Instructions (Signed)
It was great to see you again today, I will be in touch with your labs soon as possible Keep up the good work!  Assuming all is well we can visit in one year    Health Maintenance, Female Adopting a healthy lifestyle and getting preventive care are important in promoting health and wellness. Ask your health care provider about:  The right schedule for you to have regular tests and exams.  Things you can do on your own to prevent diseases and keep yourself healthy. What should I know about diet, weight, and exercise? Eat a healthy diet   Eat a diet that includes plenty of vegetables, fruits, low-fat dairy products, and lean protein.  Do not eat a lot of foods that are high in solid fats, added sugars, or sodium. Maintain a healthy weight Body mass index (BMI) is used to identify weight problems. It estimates body fat based on height and weight. Your health care provider can help determine your BMI and help you achieve or maintain a healthy weight. Get regular exercise Get regular exercise. This is one of the most important things you can do for your health. Most adults should:  Exercise for at least 150 minutes each week. The exercise should increase your heart rate and make you sweat (moderate-intensity exercise).  Do strengthening exercises at least twice a week. This is in addition to the moderate-intensity exercise.  Spend less time sitting. Even light physical activity can be beneficial. Watch cholesterol and blood lipids Have your blood tested for lipids and cholesterol at 47 years of age, then have this test every 5 years. Have your cholesterol levels checked more often if:  Your lipid or cholesterol levels are high.  You are older than 47 years of age.  You are at high risk for heart disease. What should I know about cancer screening? Depending on your health history and family history, you may need to have cancer screening at various ages. This may include screening  for:  Breast cancer.  Cervical cancer.  Colorectal cancer.  Skin cancer.  Lung cancer. What should I know about heart disease, diabetes, and high blood pressure? Blood pressure and heart disease  High blood pressure causes heart disease and increases the risk of stroke. This is more likely to develop in people who have high blood pressure readings, are of African descent, or are overweight.  Have your blood pressure checked: ? Every 3-5 years if you are 40-83 years of age. ? Every year if you are 66 years old or older. Diabetes Have regular diabetes screenings. This checks your fasting blood sugar level. Have the screening done:  Once every three years after age 20 if you are at a normal weight and have a low risk for diabetes.  More often and at a younger age if you are overweight or have a high risk for diabetes. What should I know about preventing infection? Hepatitis B If you have a higher risk for hepatitis B, you should be screened for this virus. Talk with your health care provider to find out if you are at risk for hepatitis B infection. Hepatitis C Testing is recommended for:  Everyone born from [birth year] through 1963.  Anyone with known risk factors for hepatitis C. Sexually transmitted infections (STIs)  Get screened for STIs, including gonorrhea and chlamydia, if: ? You are sexually active and are younger than 47 years of age. ? You are older than 47 years of age and your health care provider tells  you that you are at risk for this type of infection. ? Your sexual activity has changed since you were last screened, and you are at increased risk for chlamydia or gonorrhea. Ask your health care provider if you are at risk.  Ask your health care provider about whether you are at high risk for HIV. Your health care provider may recommend a prescription medicine to help prevent HIV infection. If you choose to take medicine to prevent HIV, you should first get tested for HIV.  You should then be tested every 3 months for as long as you are taking the medicine. Pregnancy  If you are about to stop having your period (premenopausal) and you may become pregnant, seek counseling before you get pregnant.  Take 400 to 800 micrograms (mcg) of folic acid every day if you become pregnant.  Ask for birth control (contraception) if you want to prevent pregnancy. Osteoporosis and menopause Osteoporosis is a disease in which the bones lose minerals and strength with aging. This can result in bone fractures. If you are 17 years old or older, or if you are at risk for osteoporosis and fractures, ask your health care provider if you should:  Be screened for bone loss.  Take a calcium or vitamin D supplement to lower your risk of fractures.  Be given hormone replacement therapy (HRT) to treat symptoms of menopause. Follow these instructions at home: Lifestyle  Do not use any products that contain nicotine or tobacco, such as cigarettes, e-cigarettes, and chewing tobacco. If you need help quitting, ask your health care provider.  Do not use street drugs.  Do not share needles.  Ask your health care provider for help if you need support or information about quitting drugs. Alcohol use  Do not drink alcohol if: ? Your health care provider tells you not to drink. ? You are pregnant, may be pregnant, or are planning to become pregnant.  If you drink alcohol: ? Limit how much you use to 0-1 drink a day. ? Limit intake if you are breastfeeding.  Be aware of how much alcohol is in your drink. In the [place] one drink equals one 12 oz bottle of beer (355 mL), one 5 oz glass of wine (148 mL), or one 1 oz glass of hard liquor (44 mL). General instructions  Schedule regular health, dental, and eye exams.  Stay current with your vaccines.  Tell your health care provider if: ? You often feel depressed. ? You have ever been abused or do not feel safe at  home. Summary  Adopting a healthy lifestyle and getting preventive care are important in promoting health and wellness.  Follow your health care provider's instructions about healthy diet, exercising, and getting tested or screened for diseases.  Follow your health care provider's instructions on monitoring your cholesterol and blood pressure. This information is not intended to replace advice given to you by your health care provider. Make sure you discuss any questions you have with your health care provider. Document Revised: 07/12/2018 Document Reviewed: 07/12/2018 Elsevier Patient Education  2020 [employer].

## 2020-06-11 NOTE — Progress Notes (Addendum)
[place] at [facility] [address], [place], [place] [address] [phone]  Date:  06/12/2020   Name:  Annette Hopkins   DOB:  [birth year]   MRN:  [id number]  PCP:  Darreld Mclean, MD    Chief Complaint: Annual Exam (pap, flu shot)   History of Present Illness:  Annette Hopkins is a 49 y.o. very pleasant female patient who presents with the following:  [name] is here today for physical exam  Last seen by myself about 1 year ago Patient with history of (questionable)rheumatoid arthritis, coagulation defect, history of DVT and PE on Xarelto, ulcerative colitis, melanoma.  She also had a severe case of shingles in 2019-she still has some numbness on her lip from this illness  Her RA sx are stable Her UC is not too bad- some sx off and on  Pap smear- at GYN in 2018,  Would like to update today.  She has an IUD in place Never had an abnormal  Mammogram up-to-date Colonoscopy 2019, patient is due this year due to ulcerative colitis COVID-19 vaccine- done Flu vaccine- give today  Routine labs due today  Married to [name], has a 60 year old daughter who is doing drivers Ed.  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  She is maintained on Lialda 2.4 g daily Dermatology- she is a pt at [facility].  No further major issues   She does carry diagnosis of RA, however at last rheumatology visit in 2019 they felt her joint symptoms were actually due to ulcerative colitis 1. History of inflammatory arthritis Amb Ref to Rheumatology  2. Ulcerative pancolitis without complication ([place])  Plan/Orders Patient with history of inflammatory arthritis secondary to inflammatory bowel disease (ulcerative colitis). On exam today, no evidence now of inflammatory arthritis. She will return if joints become hot and swollen.  We discussed the relationship of inflammatory bowel disease and inflammatory arthritis and  the diagnosis being that, not necessarily RA.   Patient Active Problem List   Diagnosis Date Noted  . Basal cell carcinoma (BCC) in situ of skin 05/21/2020  . Shingles 07/04/2018  . Melanoma ([place]) 02/20/2013  . Headache(784.0) 04/20/2012  . Osteopenia 02/21/2012  . GERD (gastroesophageal reflux disease) 02/17/2011  . Coagulation defect ([place]) 02/16/2011  . Transaminitis 11/11/2010  . Ulcerative colitis   . History of blood clots   . RA (rheumatoid arthritis) ([place])   . Polycystic ovaries 07/31/2010    Past Medical History:  Diagnosis Date  . Abnormal LFTs   . Esophagitis   . History of blood clots     B leg DVT 2005, abdomen. On Coumadin for life  . History of blood transfusion    after reaction to 6-MP  . Long term (current) use of anticoagulants   . Pulmonary embolism ([place])   . RA (rheumatoid arthritis) ([facility])    ~2005  . Ulcerative colitis    dx  around 2004    Past Surgical History:  Procedure Laterality Date  . vascular stents bilateral groins  2006    Social History   Tobacco Use  . Smoking status: Never Smoker  . Smokeless tobacco: Never Used  . Tobacco comment: Never Used Tobacco  Vaping Use  . Vaping Use: Never used  Substance Use Topics  . Alcohol use: Yes    Alcohol/week: 7.0 standard drinks    Types: 7 Glasses of wine per week  . Drug  use: No    Family History  Problem Relation Age of Onset  . Colon cancer Paternal Grandmother   . Colon polyps Paternal Grandmother   . Breast cancer Mother   . Deep vein thrombosis Father        between lung and heart  . CAD Neg Hx   . Clotting disorder Neg Hx   . Stomach cancer Neg Hx   . Esophageal cancer Neg Hx     Allergies  Allergen Reactions  . Mercaptopurine Nausea And Vomiting  . Sulfa Antibiotics Hives    Medication list has been reviewed and updated.  Current Outpatient Medications on File Prior to Visit  Medication Sig Dispense Refill  . levonorgestrel (MIRENA) 20 MCG/24HR IUD 1 each by  Intrauterine route once.    . mesalamine (LIALDA) 1.2 g EC tablet TAKE 2 TABLETS BY MOUTH  DAILY; office visit for further refills 180 tablet 0  . XARELTO 20 MG TABS tablet TAKE 1 TABLET BY MOUTH  DAILY WITH DINNER 90 tablet 3   No current facility-administered medications on file prior to visit.    Review of Systems:  As per HPI- otherwise negative.   Physical Examination: Vitals:   06/12/20 1437  BP: 118/80  Pulse: 71  Resp: 16  SpO2: 98%   Vitals:   06/12/20 1437  Weight: 126 lb (57.2 kg)  Height: 5' 4"  (1.626 m)   Body mass index is 21.63 kg/m. Ideal Body Weight: Weight in (lb) to have BMI = 25: 145.3  GEN: no acute distress. Slim build, looks well  HEENT: Atraumatic, Normocephalic.  Ears and Nose: No external deformity. CV: RRR, No M/G/R. No JVD. No thrill. No extra heart sounds. PULM: CTA B, no wheezes, crackles, rhonchi. No retractions. No resp. distress. No accessory muscle use. ABD: S, NT, ND, +BS. No rebound. No HSM. EXTR: No c/c/e PSYCH: Normally interactive. Conversant.  Pap collected today -normal pelvic exam  Assessment and Plan: Physical exam  Screening for deficiency anemia - Plan: CBC  Screening for diabetes mellitus - Plan: Comprehensive metabolic panel, Hemoglobin A1c  History of blood clots - Plan: CBC  Screening for hyperlipidemia - Plan: Lipid panel  Ulcerative colitis without complications, unspecified location ([place]) - Plan: Comprehensive metabolic panel  Screening for thyroid disorder - Plan: TSH  Encounter for vitamin deficiency screening - Plan: VITAMIN D 25 Hydroxy (Vit-D Deficiency, Fractures)  Needs flu shot - Plan: Flu Vaccine QUAD 6+ mos PF IM (Fluarix Quad PF)  Screening for cervical cancer - Plan: Cytology - PAP( [place])  Patient today for physical exam Flu shot given Discussed health maintenance, encouraged healthy diet and exercise routine Patient's ulcerative colitis is stable, she continues regular  gastroenterology care Will plan further follow- up pending labs.  This visit occurred during the SARS-CoV-2 public health emergency.  Safety protocols were in place, including screening questions prior to the visit, additional usage of staff PPE, and extensive cleaning of exam room while observing appropriate contact time as indicated for disinfecting solutions.    Signed Lamar Blinks, MD  Received her labs as below 11/12- message to pt Results for orders placed or performed in visit on 06/12/20  CBC  Result Value Ref Range   WBC 7.4 4.0 - 10.5 K/uL   RBC 4.76 3.87 - 5.11 Mil/uL   Platelets 218.0 150 - 400 K/uL   Hemoglobin 14.5 12.0 - 15.0 g/dL   HCT 42.4 36 - 46 %   MCV 89.2 78.0 - 100.0 fl  MCHC 34.2 30.0 - 36.0 g/dL   RDW 12.9 11.5 - 15.5 %  Comprehensive metabolic panel  Result Value Ref Range   Sodium 138 135 - 145 mEq/L   Potassium 3.7 3.5 - 5.1 mEq/L   Chloride 101 96 - 112 mEq/L   CO2 31 19 - 32 mEq/L   Glucose, Bld 85 70 - 99 mg/dL   BUN 12 6 - 23 mg/dL   Creatinine, Ser 0.74 0.40 - 1.20 mg/dL   Total Bilirubin 0.4 0.2 - 1.2 mg/dL   Alkaline Phosphatase 55 39 - 117 U/L   AST 18 0 - 37 U/L   ALT 20 0 - 35 U/L   Total Protein 7.1 6.0 - 8.3 g/dL   Albumin 4.6 3.5 - 5.2 g/dL   GFR 96.13 >60.00 mL/min   Calcium 9.3 8.4 - 10.5 mg/dL  Hemoglobin A1c  Result Value Ref Range   Hgb A1c MFr Bld 5.4 4.6 - 6.5 %  Lipid panel  Result Value Ref Range   Cholesterol 168 0 - 200 mg/dL   Triglycerides 65.0 0 - 149 mg/dL   HDL 70.50 >39.00 mg/dL   VLDL 13.0 0.0 - 40.0 mg/dL   LDL Cholesterol 84 0 - 99 mg/dL   Total CHOL/HDL Ratio 2    NonHDL 97.21   TSH  Result Value Ref Range   TSH 1.44 0.35 - 4.50 uIU/mL  VITAMIN D 25 Hydroxy (Vit-D Deficiency, Fractures)  Result Value Ref Range   VITD 20.32 (L) 30.00 - 100.00 ng/mL

## 2020-06-12 ENCOUNTER — Encounter: Payer: Self-pay | Admitting: Family Medicine

## 2020-06-12 ENCOUNTER — Other Ambulatory Visit (HOSPITAL_COMMUNITY)
Admission: RE | Admit: 2020-06-12 | Discharge: 2020-06-12 | Disposition: A | Discharge status: Home / Self Care | Payer: No Typology Code available for payment source | Source: Ambulatory Visit | Attending: Family Medicine | Admitting: Family Medicine

## 2020-06-12 ENCOUNTER — Other Ambulatory Visit: Payer: Self-pay

## 2020-06-12 ENCOUNTER — Ambulatory Visit (INDEPENDENT_AMBULATORY_CARE_PROVIDER_SITE_OTHER): Payer: No Typology Code available for payment source | Admitting: Family Medicine

## 2020-06-12 VITALS — BP 118/80 | HR 71 | Resp 16 | Ht 64.0 in | Wt 126.0 lb

## 2020-06-12 DIAGNOSIS — Z0000 Encounter for general adult medical examination without abnormal findings: Secondary | ICD-10-CM | POA: Diagnosis not present

## 2020-06-12 DIAGNOSIS — Z86718 Personal history of other venous thrombosis and embolism: Secondary | ICD-10-CM | POA: Diagnosis not present

## 2020-06-12 DIAGNOSIS — Z1321 Encounter for screening for nutritional disorder: Secondary | ICD-10-CM

## 2020-06-12 DIAGNOSIS — Z130 Encounter for screening for diseases of the blood and blood-forming organs and certain disorders involving the immune mechanism: Secondary | ICD-10-CM

## 2020-06-12 DIAGNOSIS — Z1329 Encounter for screening for other suspected endocrine disorder: Secondary | ICD-10-CM | POA: Diagnosis not present

## 2020-06-12 DIAGNOSIS — K5190 Ulcerative colitis, unspecified, without complications: Secondary | ICD-10-CM | POA: Diagnosis not present

## 2020-06-12 DIAGNOSIS — Z124 Encounter for screening for malignant neoplasm of cervix: Secondary | ICD-10-CM | POA: Insufficient documentation

## 2020-06-12 DIAGNOSIS — Z13220 Encounter for screening for lipoid disorders: Secondary | ICD-10-CM

## 2020-06-12 DIAGNOSIS — Z131 Encounter for screening for diabetes mellitus: Secondary | ICD-10-CM | POA: Diagnosis not present

## 2020-06-12 DIAGNOSIS — Z23 Encounter for immunization: Secondary | ICD-10-CM

## 2020-06-13 ENCOUNTER — Encounter: Payer: Self-pay | Admitting: Family Medicine

## 2020-06-13 LAB — VITAMIN D 25 HYDROXY (VIT D DEFICIENCY, FRACTURES): VITD: 20.32 ng/mL — ABNORMAL LOW (ref 30.00–100.00)

## 2020-06-13 LAB — LIPID PANEL
Cholesterol: 168 mg/dL (ref 0–200)
HDL: 70.5 mg/dL (ref >39.00)
LDL Cholesterol: 84 mg/dL (ref 0–99)
NonHDL: 97.21
Total CHOL/HDL Ratio: 2
Triglycerides: 65 mg/dL (ref 0.0–149.0)
VLDL: 13 mg/dL (ref 0.0–40.0)

## 2020-06-13 LAB — CBC
HCT: 42.4 % (ref 36.0–46.0)
Hemoglobin: 14.5 g/dL (ref 12.0–15.0)
MCHC: 34.2 g/dL (ref 30.0–36.0)
MCV: 89.2 fl (ref 78.0–100.0)
Platelets: 218 10*3/uL (ref 150.0–400.0)
RBC: 4.76 Mil/uL (ref 3.87–5.11)
RDW: 12.9 % (ref 11.5–15.5)
WBC: 7.4 10*3/uL (ref 4.0–10.5)

## 2020-06-13 LAB — COMPREHENSIVE METABOLIC PANEL
ALT: 20 U/L (ref 0–35)
AST: 18 U/L (ref 0–37)
Albumin: 4.6 g/dL (ref 3.5–5.2)
Alkaline Phosphatase: 55 U/L (ref 39–117)
BUN: 12 mg/dL (ref 6–23)
CO2: 31 mEq/L (ref 19–32)
Calcium: 9.3 mg/dL (ref 8.4–10.5)
Chloride: 101 mEq/L (ref 96–112)
Creatinine, Ser: 0.74 mg/dL (ref 0.40–1.20)
GFR: 96.13 mL/min (ref >60.00)
Glucose, Bld: 85 mg/dL (ref 70–99)
Potassium: 3.7 mEq/L (ref 3.5–5.1)
Sodium: 138 mEq/L (ref 135–145)
Total Bilirubin: 0.4 mg/dL (ref 0.2–1.2)
Total Protein: 7.1 g/dL (ref 6.0–8.3)

## 2020-06-13 LAB — TSH: TSH: 1.44 u[IU]/mL (ref 0.35–4.50)

## 2020-06-13 LAB — HEMOGLOBIN A1C: Hgb A1c MFr Bld: 5.4 % (ref 4.6–6.5)

## 2020-06-16 ENCOUNTER — Encounter: Payer: Self-pay | Admitting: Family Medicine

## 2020-06-16 LAB — CYTOLOGY - PAP
Comment: NEGATIVE
Diagnosis: NEGATIVE
High risk HPV: NEGATIVE

## 2020-07-29 ENCOUNTER — Ambulatory Visit: Payer: No Typology Code available for payment source | Attending: Internal Medicine

## 2020-07-29 ENCOUNTER — Other Ambulatory Visit (HOSPITAL_BASED_OUTPATIENT_CLINIC_OR_DEPARTMENT_OTHER): Payer: Self-pay | Admitting: Internal Medicine

## 2020-07-29 DIAGNOSIS — Z23 Encounter for immunization: Secondary | ICD-10-CM

## 2020-07-29 MED FILL — MODERNA COVID-19 VACCINE 10: 100 | 28 days supply | Qty: 0 | Fill #0

## 2020-08-29 ENCOUNTER — Other Ambulatory Visit: Payer: Self-pay | Admitting: Physician Assistant

## 2020-09-13 ENCOUNTER — Other Ambulatory Visit: Payer: Self-pay | Admitting: Physician Assistant

## 2020-09-16 ENCOUNTER — Encounter: Payer: Self-pay | Admitting: Internal Medicine

## 2020-11-11 NOTE — Progress Notes (Deleted)
[place] at [facility] [address] [place], [place] [address] [phone]  Date:  11/13/2020   Name:  Annette Hopkins   DOB:  02-Jun-1971   MRN:  [id number]  PCP:  Darreld Mclean, MD    Chief Complaint: No chief complaint on file.   History of Present Illness:  Annette Hopkins is a 50 y.o. very pleasant female patient who presents with the following:  Pt here today with concern of a rash in her underarm area Last seen by myself in November of last year  Patient with history of (questionable)rheumatoid arthritis, coagulation defect, history of DVT and PE on Xarelto, ulcerative colitis, melanoma.  She also had a severe case of shingles in 2019-she still has some numbness on her lip from this illness  Patient Active Problem List   Diagnosis Date Noted  . Basal cell carcinoma (BCC) in situ of skin 05/21/2020  . Shingles 07/04/2018  . Melanoma ([place]) 02/20/2013  . Headache(784.0) 04/20/2012  . Osteopenia 02/21/2012  . GERD (gastroesophageal reflux disease) 02/17/2011  . Coagulation defect ([place]) 02/16/2011  . Transaminitis 11/11/2010  . Ulcerative colitis   . History of blood clots   . RA (rheumatoid arthritis) ([place])   . Polycystic ovaries 07/31/2010    Past Medical History:  Diagnosis Date  . Abnormal LFTs   . Esophagitis   . History of blood clots     B leg DVT 2005, abdomen. On Coumadin for life  . History of blood transfusion    after reaction to 6-MP  . Long term (current) use of anticoagulants   . Pulmonary embolism ([place])   . RA (rheumatoid arthritis) ([facility])    ~2005  . Ulcerative colitis    dx  around 2004    Past Surgical History:  Procedure Laterality Date  . vascular stents bilateral groins  2006    Social History   Tobacco Use  . Smoking status: Never Smoker  . Smokeless tobacco: Never Used  . Tobacco comment: Never Used Tobacco  Vaping Use  . Vaping Use: Never used  Substance Use Topics  .  Alcohol use: Yes    Alcohol/week: 7.0 standard drinks    Types: 7 Glasses of wine per week  . Drug use: No    Family History  Problem Relation Age of Onset  . Colon cancer Paternal Grandmother   . Colon polyps Paternal Grandmother   . Breast cancer Mother   . Deep vein thrombosis Father        between lung and heart  . CAD Neg Hx   . Clotting disorder Neg Hx   . Stomach cancer Neg Hx   . Esophageal cancer Neg Hx     Allergies  Allergen Reactions  . Mercaptopurine Nausea And Vomiting  . Sulfa Antibiotics Hives    Medication list has been reviewed and updated.  Current Outpatient Medications on File Prior to Visit  Medication Sig Dispense Refill  . COVID-19 mRNA vaccine, Moderna, 100 MCG/0.5ML injection INJECT AS DIRECTED .25 mL 0  . levonorgestrel (MIRENA) 20 MCG/24HR IUD 1 each by Intrauterine route once.    . mesalamine (LIALDA) 1.2 g EC tablet TAKE 2 TABLETS BY MOUTH  DAILY; office visit for further refills 180 tablet 0  . XARELTO 20 MG TABS tablet TAKE 1 TABLET BY MOUTH  DAILY WITH DINNER 90 tablet 3   No current facility-administered medications on file prior to visit.  Review of Systems:  As per HPI- otherwise negative.   Physical Examination: There were no vitals filed for this visit. There were no vitals filed for this visit. There is no height or weight on file to calculate BMI. Ideal Body Weight:    GEN: no acute distress. HEENT: Atraumatic, Normocephalic.  Ears and Nose: No external deformity. CV: RRR, No M/G/R. No JVD. No thrill. No extra heart sounds. PULM: CTA B, no wheezes, crackles, rhonchi. No retractions. No resp. distress. No accessory muscle use. ABD: S, NT, ND, +BS. No rebound. No HSM. EXTR: No c/c/e PSYCH: Normally interactive. Conversant.    Assessment and Plan: *** This visit occurred during the SARS-CoV-2 public health emergency.  Safety protocols were in place, including screening questions prior to the visit, additional usage of  staff PPE, and extensive cleaning of exam room while observing appropriate contact time as indicated for disinfecting solutions.    Signed Lamar Blinks, MD

## 2020-11-13 ENCOUNTER — Ambulatory Visit: Payer: No Typology Code available for payment source | Admitting: Family Medicine

## 2020-12-30 ENCOUNTER — Other Ambulatory Visit: Payer: Self-pay

## 2020-12-30 ENCOUNTER — Ambulatory Visit (INDEPENDENT_AMBULATORY_CARE_PROVIDER_SITE_OTHER): Payer: No Typology Code available for payment source | Admitting: Family

## 2020-12-30 ENCOUNTER — Ambulatory Visit (HOSPITAL_BASED_OUTPATIENT_CLINIC_OR_DEPARTMENT_OTHER)
Admission: RE | Admit: 2020-12-30 | Discharge: 2020-12-30 | Disposition: A | Discharge status: Home / Self Care | Payer: No Typology Code available for payment source | Source: Ambulatory Visit | Attending: Family | Admitting: Family

## 2020-12-30 ENCOUNTER — Encounter: Payer: Self-pay | Admitting: Family

## 2020-12-30 VITALS — BP 107/67 | HR 86 | Temp 98.2°F | Resp 16 | Ht 64.0 in | Wt 123.0 lb

## 2020-12-30 DIAGNOSIS — M545 Low back pain, unspecified: Secondary | ICD-10-CM | POA: Diagnosis not present

## 2020-12-30 LAB — POC URINALSYSI DIPSTICK (AUTOMATED)
Bilirubin, UA: NEGATIVE
Glucose, UA: NEGATIVE
Ketones, UA: NEGATIVE
Leukocytes, UA: NEGATIVE
Nitrite, UA: NEGATIVE
Protein, UA: NEGATIVE
Spec Grav, UA: 1.025 (ref 1.010–1.025)
Urobilinogen, UA: NEGATIVE E.U./dL — AB
pH, UA: 6 (ref 5.0–8.0)

## 2020-12-30 MED ORDER — NITROFURANTOIN MONOHYD MACRO 100 MG PO CAPS: 100.0000 mg | ORAL_CAPSULE | Freq: Two times a day (BID) | ORAL | 0 refills | Status: DC

## 2020-12-30 NOTE — Progress Notes (Signed)
Subjective:    Patient ID: Annette Hopkins, female    DOB: [birth year], 50 y.o.   MRN: [id number]  HPI  Patient is a 50 yr old female who presents today with chief complaint of right sided low back pain which started yesterday.  She denies dysuria, frequency, notes "a little bit of blood with wiping." Denies hx of fever or kidney stone.   Review of Systems See HPI  Past Medical History:  Diagnosis Date  . Abnormal LFTs   . Esophagitis   . History of blood clots     B leg DVT 2005, abdomen. On Coumadin for life  . History of blood transfusion    after reaction to 6-MP  . Long term (current) use of anticoagulants   . Pulmonary embolism ([place])   . RA (rheumatoid arthritis) ([facility])    ~2005  . Ulcerative colitis    dx  around 2004     Social History   Socioeconomic History  . Marital status: Married    Spouse name: Not on file  . Number of children: 1  . Years of education: Not on file  . Highest education level: Not on file  Occupational History  . 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 Smoking status: Never Smoker  . Smokeless tobacco: Never Used  . Tobacco comment: Never Used Tobacco  Vaping Use  . Vaping Use: Never used  Substance and Sexual Activity  . Alcohol use: Yes    Alcohol/week: 7.0 standard drinks    Types: 7 Glasses of wine per week  . Drug use: No  . Sexual activity: Yes    Partners: Male    Birth control/protection: I.U.D.  Other Topics Concern  . 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 vascular  stents bilateral groins  2006    Family History  Problem Relation Age of Onset  . Colon cancer Paternal Grandmother   . Colon polyps Paternal Grandmother   . Breast cancer Mother   . Deep vein thrombosis Father        between lung and heart  . CAD Neg Hx   . Clotting disorder Neg Hx   . Stomach cancer Neg Hx   . Esophageal cancer Neg Hx     Allergies  Allergen Reactions  . Mercaptopurine Nausea And Vomiting  . Sulfa Antibiotics Hives    Current Outpatient Medications on File Prior to Visit  Medication Sig Dispense Refill  . COVID-19 mRNA vaccine, Moderna, 100 MCG/0.5ML injection INJECT AS DIRECTED .25 mL 0  . levonorgestrel (MIRENA) 20 MCG/24HR IUD 1 each by Intrauterine route once.    Alveda Reasons 20 MG TABS tablet TAKE 1 TABLET BY MOUTH  DAILY WITH DINNER 90 tablet 3   No current facility-administered medications on file prior to visit.    BP 107/67 (BP Location: Right Arm, Patient Position: Sitting, Cuff Size: Small)   Pulse 86   Temp 98.2 F (36.8 C) (  Oral)   Resp 16   Ht 5' 4"  (1.626 m)   Wt 123 lb (55.8 kg)   SpO2 100%   BMI 21.11 kg/m       Objective:   Physical Exam Constitutional:      Appearance: She is well-developed.  Neck:     Thyroid: No thyromegaly.  Cardiovascular:     Rate and Rhythm: Normal rate and regular rhythm.     Heart sounds: Normal heart sounds. No murmur heard.   Pulmonary:     Effort: Pulmonary effort is normal. No respiratory distress.     Breath sounds: Normal breath sounds. No wheezing.  Abdominal:     General: Bowel sounds are normal. There is no distension.     Palpations: Abdomen is soft. There is no mass.     Tenderness: There is right CVA tenderness. There is no left CVA tenderness.  Musculoskeletal:     Cervical back: Neck supple.  Skin:    General: Skin is warm and dry.  Neurological:     Mental Status: She is alert and oriented to person, place, and time.  Psychiatric:        Behavior: Behavior normal.         Thought Content: Thought content normal.        Judgment: Judgment normal.           Assessment & Plan:  Flank pain- ? Musculoskeletal versus Renal etiology. UA notes microscopic blood only. This could be due to some light menstrual bleeding, also need to consider possibility of kidney stone. Will plan empiric rx with macrobid 15m bid and obtain a KUB to evaluate for stone. Will also plan to culture urine. She is advised to call if new/worsening symptoms or if symptoms fail to improve.   This visit occurred during the SARS-CoV-2 public health emergency.  Safety protocols were in place, including screening questions prior to the visit, additional usage of staff PPE, and extensive cleaning of exam room while observing appropriate contact time as indicated for disinfecting solutions.

## 2020-12-30 NOTE — Patient Instructions (Signed)
Please start macrobid (antibiotic). Call if your symptoms worsen or if symptoms are not improved in 3-4 days.  Complete your abdominal x-ray on the first floor.

## 2020-12-31 LAB — URINE CULTURE
MICRO NUMBER:: 11950094
SPECIMEN QUALITY:: ADEQUATE

## 2021-01-01 ENCOUNTER — Other Ambulatory Visit: Payer: Self-pay | Admitting: Family Medicine

## 2021-01-01 DIAGNOSIS — D689 Coagulation defect, unspecified: Secondary | ICD-10-CM

## 2021-01-02 ENCOUNTER — Encounter: Payer: Self-pay | Admitting: Family Medicine

## 2021-01-02 ENCOUNTER — Telehealth: Payer: Self-pay | Admitting: Family

## 2021-01-02 NOTE — Telephone Encounter (Signed)
Patient has hx of DVT do you want to continue?

## 2021-01-02 NOTE — Telephone Encounter (Signed)
See mychart.  

## 2021-01-30 ENCOUNTER — Telehealth: Payer: Self-pay

## 2021-01-30 DIAGNOSIS — Z1231 Encounter for screening mammogram for malignant neoplasm of breast: Secondary | ICD-10-CM

## 2021-01-30 NOTE — Addendum Note (Signed)
Addended by: Wynonia Musty A on: 01/30/2021 10:29 AM   Modules accepted: Orders

## 2021-01-30 NOTE — Telephone Encounter (Signed)
Pt called to have her Mammogram order placed to Imaging downstairs.

## 2021-03-10 ENCOUNTER — Ambulatory Visit (HOSPITAL_BASED_OUTPATIENT_CLINIC_OR_DEPARTMENT_OTHER)
Admission: RE | Admit: 2021-03-10 | Discharge: 2021-03-10 | Disposition: A | Discharge status: Home / Self Care | Payer: No Typology Code available for payment source | Source: Ambulatory Visit | Attending: Family Medicine | Admitting: Family Medicine

## 2021-03-10 ENCOUNTER — Encounter (HOSPITAL_BASED_OUTPATIENT_CLINIC_OR_DEPARTMENT_OTHER): Payer: Self-pay

## 2021-03-10 ENCOUNTER — Other Ambulatory Visit: Payer: Self-pay | Admitting: Family Medicine

## 2021-03-10 ENCOUNTER — Other Ambulatory Visit: Payer: Self-pay

## 2021-03-10 DIAGNOSIS — Z1231 Encounter for screening mammogram for malignant neoplasm of breast: Secondary | ICD-10-CM | POA: Diagnosis present

## 2021-03-13 ENCOUNTER — Other Ambulatory Visit: Payer: Self-pay | Admitting: Family Medicine

## 2021-03-13 DIAGNOSIS — R928 Other abnormal and inconclusive findings on diagnostic imaging of breast: Secondary | ICD-10-CM

## 2021-03-19 ENCOUNTER — Other Ambulatory Visit: Payer: Self-pay

## 2021-03-19 ENCOUNTER — Ambulatory Visit
Admission: RE | Admit: 2021-03-19 | Discharge: 2021-03-19 | Disposition: A | Discharge status: Home / Self Care | Payer: No Typology Code available for payment source | Source: Ambulatory Visit | Attending: Family Medicine | Admitting: Family Medicine

## 2021-03-19 DIAGNOSIS — R928 Other abnormal and inconclusive findings on diagnostic imaging of breast: Secondary | ICD-10-CM

## 2021-03-23 ENCOUNTER — Other Ambulatory Visit: Payer: No Typology Code available for payment source

## 2021-04-14 ENCOUNTER — Ambulatory Visit: Payer: No Typology Code available for payment source | Admitting: Internal Medicine

## 2021-06-19 NOTE — Progress Notes (Addendum)
[facility] [address], [place], [place] [address] [phone] [id number]  Date:  06/22/2021   Name:  Annette Hopkins   DOB:  1970/12/16   MRN:  [id number]  PCP:  Darreld Mclean, MD    Chief Complaint: Annual Exam (Concerns/ questions: troubles with sleep/Flu shot today: yes/)   History of Present Illness:  Annette Hopkins is a 50 y.o. very pleasant female patient who presents with the following:  Pt seen today for a CPE Last visit with myself about one year ago  Patient with history of (questionable) rheumatoid arthritis, coagulation defect, history of DVT and PE on Xarelto, ulcerative colitis, melanoma, vit d def.  She also had a severe case of shingles in 2019-she still has some numbness on her lip from this illness  She had a "cold" about a week ago 5 days ago- she had to be out of school on Friday as she was too ill to attend.  She noted cough, body aches and fatigue. 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  She is not sleeping very well.  She notes that she sleeps fine on the weekends a couple of breaks, but the night before school she can 7 back on She would like to try something for sleep- she is only getting about 4 hours a night! She has tried benadryl but she is too sedated the next day Melatonin does not help  Her mother takes trazodone, she would feel comfortable giving this a try  Colonoscopy- 2019- she has an appt with GI next month  Covid  booster-recommended Pneumonia vaccine Flu -we had planned to give today.  However, we suspect the patient is actually getting over the flu right now Labs one year ago  Mammo and pap UTD  Patient Active Problem List   Diagnosis Date Noted   Basal cell carcinoma ([place]) in situ of skin 05/21/2020   Shingles 07/04/2018   Melanoma ([place]) 02/20/2013   Headache(784.0) 04/20/2012   Osteopenia 02/21/2012   GERD (gastroesophageal reflux disease) 02/17/2011   Coagulation defect ([place]) 02/16/2011   Transaminitis 11/11/2010   Ulcerative colitis    History of blood clots    RA (rheumatoid arthritis) ([place])    Polycystic ovaries 07/31/2010    Past Medical History:  Diagnosis Date   Abnormal LFTs    Esophagitis    History of blood clots     B leg DVT 2005, abdomen. On Coumadin for life   History of blood transfusion    after reaction to 6-MP   Long term (current) use of anticoagulants    Pulmonary embolism ([facility])    RA (rheumatoid arthritis) ([place])    ~2005   Ulcerative colitis    dx  around 2004    Past Surgical History:  Procedure Laterality Date   vascular stents bilateral groins  2006    Social History   Tobacco Use   Smoking status: Never  Smokeless tobacco: Never   Tobacco comments:    Never Used Tobacco  Vaping Use   Vaping Use: Never used  Substance Use Topics   Alcohol use: Yes    Alcohol/week: 7.0 standard drinks    Types: 7 Glasses of wine per week   Drug use: No    Family History  Problem Relation Age of Onset   Colon cancer Paternal Grandmother    Colon polyps Paternal Grandmother    Breast cancer Mother    Deep vein thrombosis Father        between lung and heart   CAD Neg Hx    Clotting disorder Neg Hx    Stomach cancer Neg Hx    Esophageal cancer Neg Hx     Allergies  Allergen Reactions   Mercaptopurine Nausea And Vomiting   Sulfa Antibiotics Hives    Medication list has been reviewed and updated.  Current Outpatient Medications on File Prior  to Visit  Medication Sig Dispense Refill   COVID-19 mRNA vaccine, Moderna, 100 MCG/0.5ML injection INJECT AS DIRECTED .25 mL 0   levonorgestrel (MIRENA) 20 MCG/24HR IUD 1 each by Intrauterine route once.     XARELTO 20 MG TABS tablet TAKE 1 TABLET BY MOUTH  DAILY WITH DINNER 90 tablet 3   No current facility-administered medications on file prior to visit.    Review of Systems:  As per HPI- otherwise negative.   Physical Examination: Vitals:   06/22/21 1430  BP: 100/60  Pulse: 90  Resp: 18  Temp: 97.7 F (36.5 C)  SpO2: 99%   Vitals:   06/22/21 1430  Weight: 116 lb 9.6 oz (52.9 kg)  Height: 5' 4"  (1.626 m)   Body mass index is 20.01 kg/m. Ideal Body Weight: Weight in (lb) to have BMI = 25: 145.3  GEN: no acute distress.  Petite and slender build, otherwise looks well HEENT: Atraumatic, Normocephalic.  Bilateral TM wnl, oropharynx injected left side only.  PEERL,EOMI.   Ears and Nose: No external deformity. CV: RRR, No M/G/R. No JVD. No thrill. No extra heart sounds. PULM: CTA B, no wheezes, crackles, rhonchi. No retractions. No resp. distress. No accessory muscle use. ABD: S, NT, ND, +BS. No rebound. No HSM. EXTR: No c/c/e PSYCH: Normally interactive. Conversant.   Results for orders placed or performed in visit on 06/22/21  POCT rapid strep A  Result Value Ref Range   Rapid Strep A Screen Negative Negative    Assessment and Plan: Physical exam  Screening for deficiency anemia - Plan: CBC  Screening for diabetes mellitus - Plan: Comprehensive metabolic panel, Hemoglobin A1c  History of blood clots  Screening for thyroid disorder - Plan: TSH  Ulcerative colitis without complications, unspecified location ([place])  Screening for hyperlipidemia - Plan: Lipid panel  Fatigue, unspecified type - Plan: TSH, VITAMIN D 25 Hydroxy (Vit-D Deficiency, Fractures)  Vitamin D deficiency - Plan: VITAMIN D 25 Hydroxy (Vit-D Deficiency, Fractures)  Pharyngitis,  unspecified etiology - Plan: POCT rapid strep A  Insomnia due to stress - Plan: traZODone (DESYREL) 50 MG tablet   Physical exam today- encouraged healthy diet and exercise routine Will plan further follow- up pending labs. Discussed health maintenance.  Encouraged her to get flu shot and COVID bivalent booster next week.    She was recently sick with flulike symptoms, flu was very rampant in our area right now.  Unfortunate has not able to test her for the flu as we do not have testing materials.  However,  as she was negative for COVID we do suspect she is getting over influenza  She will alert me if not continuing to get better   For insomnia we will have her try trazodone, start with 25 mg can increase to 100 if needed Signed Lamar Blinks, MD  Addendum 11/22, received her labs as below.  Message to patient  Results for orders placed or performed in visit on 06/22/21  CBC  Result Value Ref Range   WBC 3.4 (L) 4.0 - 10.5 K/uL   RBC 4.51 3.87 - 5.11 Mil/uL   Platelets 153.0 150.0 - 400.0 K/uL   Hemoglobin 13.2 12.0 - 15.0 g/dL   HCT 39.1 36.0 - 46.0 %   MCV 86.9 78.0 - 100.0 fl   MCHC 33.7 30.0 - 36.0 g/dL   RDW 13.4 11.5 - 15.5 %  Comprehensive metabolic panel  Result Value Ref Range   Sodium 137 135 - 145 mEq/L   Potassium 3.8 3.5 - 5.1 mEq/L   Chloride 101 96 - 112 mEq/L   CO2 30 19 - 32 mEq/L   Glucose, Bld 120 (H) 70 - 99 mg/dL   BUN 6 6 - 23 mg/dL   Creatinine, Ser 0.64 0.40 - 1.20 mg/dL   Total Bilirubin 0.4 0.2 - 1.2 mg/dL   Alkaline Phosphatase 78 39 - 117 U/L   AST 27 0 - 37 U/L   ALT 27 0 - 35 U/L   Total Protein 6.4 6.0 - 8.3 g/dL   Albumin 3.8 3.5 - 5.2 g/dL   GFR 104.24 >60.00 mL/min   Calcium 8.4 8.4 - 10.5 mg/dL  Hemoglobin A1c  Result Value Ref Range   Hgb A1c MFr Bld 5.6 4.6 - 6.5 %  Lipid panel  Result Value Ref Range   Cholesterol 103 0 - 200 mg/dL   Triglycerides 103.0 0.0 - 149.0 mg/dL   HDL 39.30 >39.00 mg/dL   VLDL 20.6 0.0 - 40.0  mg/dL   LDL Cholesterol 43 0 - 99 mg/dL   Total CHOL/HDL Ratio 3    NonHDL 63.97   TSH  Result Value Ref Range   TSH 0.83 0.35 - 5.50 uIU/mL  VITAMIN D 25 Hydroxy (Vit-D Deficiency, Fractures)  Result Value Ref Range   VITD 23.81 (L) 30.00 - 100.00 ng/mL  POCT rapid strep A  Result Value Ref Range   Rapid Strep A Screen Negative Negative

## 2021-06-19 NOTE — Patient Instructions (Addendum)
Good to see you today- I will be in touch with your labs asap  Try the trazodone as needed for sleep- let me know if not helpful for you Please plan to get your covid bivalant booster and flu shot next week

## 2021-06-22 ENCOUNTER — Other Ambulatory Visit: Payer: Self-pay

## 2021-06-22 ENCOUNTER — Ambulatory Visit (INDEPENDENT_AMBULATORY_CARE_PROVIDER_SITE_OTHER): Payer: No Typology Code available for payment source | Admitting: Family Medicine

## 2021-06-22 VITALS — BP 100/60 | HR 90 | Temp 97.7°F | Resp 18 | Ht 64.0 in | Wt 116.6 lb

## 2021-06-22 DIAGNOSIS — Z1329 Encounter for screening for other suspected endocrine disorder: Secondary | ICD-10-CM | POA: Diagnosis not present

## 2021-06-22 DIAGNOSIS — Z0000 Encounter for general adult medical examination without abnormal findings: Secondary | ICD-10-CM

## 2021-06-22 DIAGNOSIS — Z86718 Personal history of other venous thrombosis and embolism: Secondary | ICD-10-CM | POA: Diagnosis not present

## 2021-06-22 DIAGNOSIS — F5102 Adjustment insomnia: Secondary | ICD-10-CM

## 2021-06-22 DIAGNOSIS — D72819 Decreased white blood cell count, unspecified: Secondary | ICD-10-CM

## 2021-06-22 DIAGNOSIS — Z131 Encounter for screening for diabetes mellitus: Secondary | ICD-10-CM

## 2021-06-22 DIAGNOSIS — K5190 Ulcerative colitis, unspecified, without complications: Secondary | ICD-10-CM

## 2021-06-22 DIAGNOSIS — E559 Vitamin D deficiency, unspecified: Secondary | ICD-10-CM | POA: Diagnosis not present

## 2021-06-22 DIAGNOSIS — R5383 Other fatigue: Secondary | ICD-10-CM

## 2021-06-22 DIAGNOSIS — Z13220 Encounter for screening for lipoid disorders: Secondary | ICD-10-CM | POA: Diagnosis not present

## 2021-06-22 DIAGNOSIS — Z130 Encounter for screening for diseases of the blood and blood-forming organs and certain disorders involving the immune mechanism: Secondary | ICD-10-CM | POA: Diagnosis not present

## 2021-06-22 DIAGNOSIS — J029 Acute pharyngitis, unspecified: Secondary | ICD-10-CM | POA: Diagnosis not present

## 2021-06-22 LAB — POCT RAPID STREP A (OFFICE): Rapid Strep A Screen: NEGATIVE

## 2021-06-22 MED ORDER — TRAZODONE HCL 50 MG PO TABS: 25.0000 mg | ORAL_TABLET | Freq: Every evening | ORAL | 3 refills | Status: DC | PRN

## 2021-06-23 ENCOUNTER — Encounter: Payer: Self-pay | Admitting: Family Medicine

## 2021-06-23 LAB — CBC
HCT: 39.1 % (ref 36.0–46.0)
Hemoglobin: 13.2 g/dL (ref 12.0–15.0)
MCHC: 33.7 g/dL (ref 30.0–36.0)
MCV: 86.9 fl (ref 78.0–100.0)
Platelets: 153 10*3/uL (ref 150.0–400.0)
RBC: 4.51 Mil/uL (ref 3.87–5.11)
RDW: 13.4 % (ref 11.5–15.5)
WBC: 3.4 10*3/uL — ABNORMAL LOW (ref 4.0–10.5)

## 2021-06-23 LAB — LIPID PANEL
Cholesterol: 103 mg/dL (ref 0–200)
HDL: 39.3 mg/dL (ref >39.00)
LDL Cholesterol: 43 mg/dL (ref 0–99)
NonHDL: 63.97
Total CHOL/HDL Ratio: 3
Triglycerides: 103 mg/dL (ref 0.0–149.0)
VLDL: 20.6 mg/dL (ref 0.0–40.0)

## 2021-06-23 LAB — VITAMIN D 25 HYDROXY (VIT D DEFICIENCY, FRACTURES): VITD: 23.81 ng/mL — ABNORMAL LOW (ref 30.00–100.00)

## 2021-06-23 LAB — COMPREHENSIVE METABOLIC PANEL
ALT: 27 U/L (ref 0–35)
AST: 27 U/L (ref 0–37)
Albumin: 3.8 g/dL (ref 3.5–5.2)
Alkaline Phosphatase: 78 U/L (ref 39–117)
BUN: 6 mg/dL (ref 6–23)
CO2: 30 mEq/L (ref 19–32)
Calcium: 8.4 mg/dL (ref 8.4–10.5)
Chloride: 101 mEq/L (ref 96–112)
Creatinine, Ser: 0.64 mg/dL (ref 0.40–1.20)
GFR: 104.24 mL/min (ref >60.00)
Glucose, Bld: 120 mg/dL — ABNORMAL HIGH (ref 70–99)
Potassium: 3.8 mEq/L (ref 3.5–5.1)
Sodium: 137 mEq/L (ref 135–145)
Total Bilirubin: 0.4 mg/dL (ref 0.2–1.2)
Total Protein: 6.4 g/dL (ref 6.0–8.3)

## 2021-06-23 LAB — TSH: TSH: 0.83 u[IU]/mL (ref 0.35–5.50)

## 2021-06-23 LAB — HEMOGLOBIN A1C: Hgb A1c MFr Bld: 5.6 % (ref 4.6–6.5)

## 2021-06-23 NOTE — Addendum Note (Signed)
Addended by: Lamar Blinks C on: 06/23/2021 02:20 PM   Modules accepted: Orders

## 2021-07-15 ENCOUNTER — Ambulatory Visit: Payer: No Typology Code available for payment source | Admitting: Internal Medicine

## 2021-07-17 ENCOUNTER — Ambulatory Visit: Payer: No Typology Code available for payment source | Admitting: Gastroenterology

## 2021-07-21 ENCOUNTER — Ambulatory Visit: Payer: No Typology Code available for payment source | Attending: Internal Medicine

## 2021-07-21 ENCOUNTER — Ambulatory Visit: Payer: No Typology Code available for payment source

## 2021-07-21 DIAGNOSIS — Z23 Encounter for immunization: Secondary | ICD-10-CM

## 2021-07-21 NOTE — Progress Notes (Signed)
° °  Covid-19 Vaccination Clinic  Name:  Annette Hopkins    MRN: [id number] DOB: Jan 07, 1971  07/21/2021  Annette Hopkins was observed post Covid-19 immunization for 15 minutes without incident. She was provided with Vaccine Information Sheet and instruction to access the V-Safe system.   Annette Hopkins was instructed to call 911 with any severe reactions post vaccine: Difficulty breathing  Swelling of face and throat  A fast heartbeat  A bad rash all over body  Dizziness and weakness   Immunizations Administered     Name Date Dose VIS Date Route   Moderna Covid-19 vaccine Bivalent Booster 07/21/2021  2:42 PM 0.5 mL 03/14/2021 Intramuscular   Manufacturer: Levan Hurst   Lot: [id number]   [place]: [id number]

## 2021-07-23 ENCOUNTER — Other Ambulatory Visit (HOSPITAL_BASED_OUTPATIENT_CLINIC_OR_DEPARTMENT_OTHER): Payer: Self-pay

## 2021-07-23 MED ORDER — MODERNA COVID-19 BIVAL BOOSTER 50 MCG/0.5ML IM SUSP
INTRAMUSCULAR | 0 refills | Status: DC
  Filled 2021-07-23: qty 0.5, 1d supply, fill #0

## 2021-09-29 ENCOUNTER — Other Ambulatory Visit: Payer: Self-pay | Admitting: Family Medicine

## 2021-09-29 DIAGNOSIS — F5102 Adjustment insomnia: Secondary | ICD-10-CM

## 2021-11-12 IMAGING — MG MM DIGITAL SCREENING BILAT W/ TOMO AND CAD
8 series · 9 of 24 positions shown · non-contrast
Comparison: Previous exam(s).

CLINICAL DATA: Screening.

EXAM:
DIGITAL SCREENING BILATERAL MAMMOGRAM WITH TOMOSYNTHESIS AND CAD
TECHNIQUE: Bilateral screening digital craniocaudal and mediolateral oblique
mammograms were obtained. Bilateral screening digital breast
tomosynthesis was performed. The images were evaluated with
computer-aided detection.

[R MLO synth-2D]
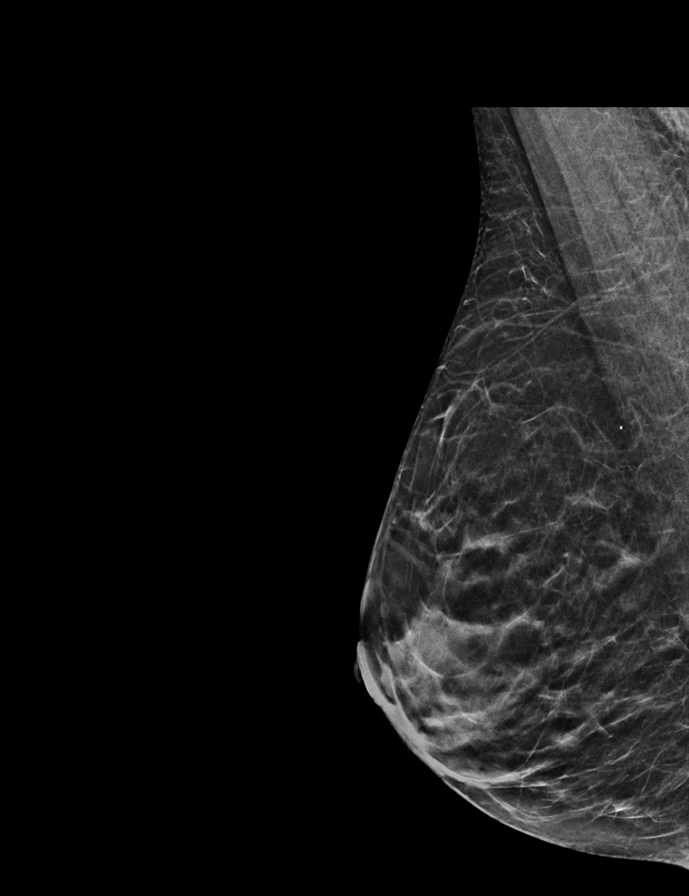

[L MLO synth-2D]
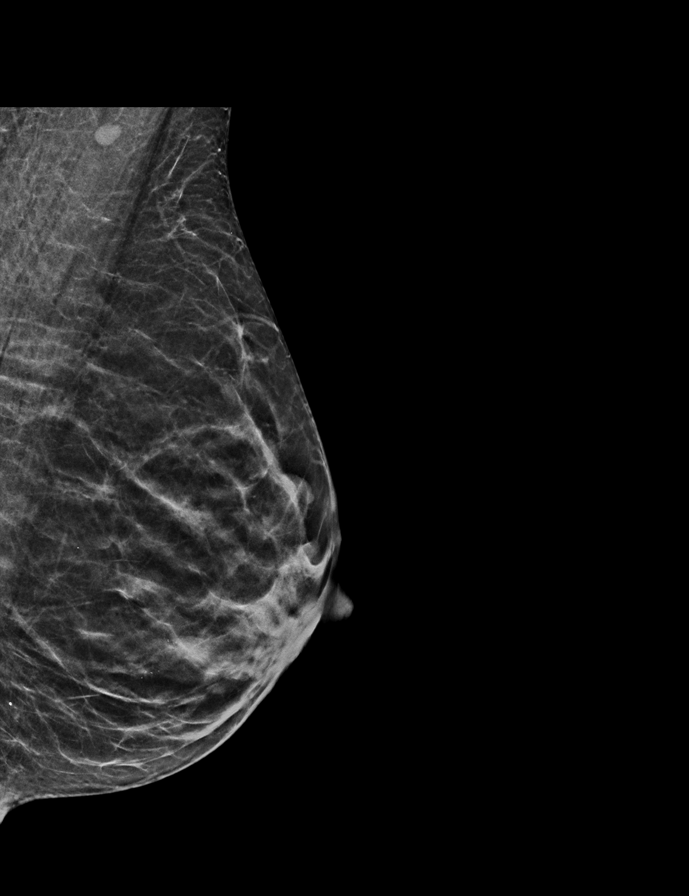

[L CC synth-2D]
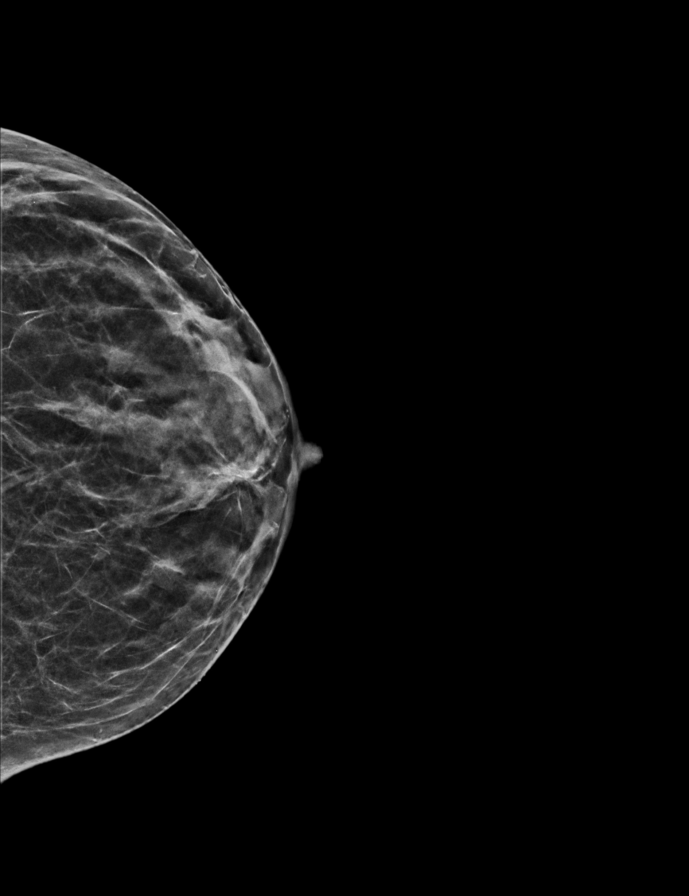

[R CC synth-2D]
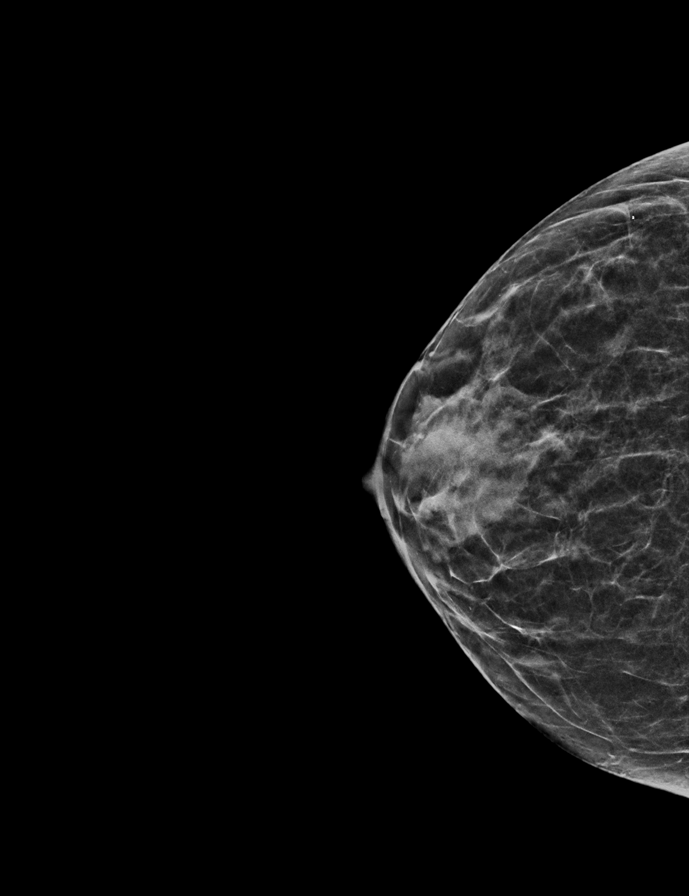

[R CC tomo · 2 of 43 frames shown]
[frame 14/43]
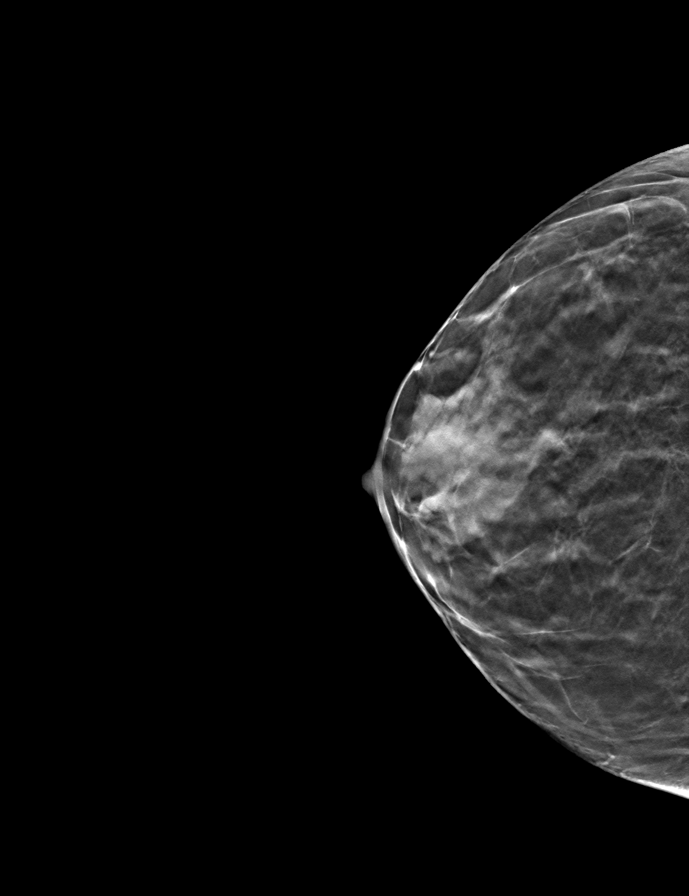
[frame 22/43]
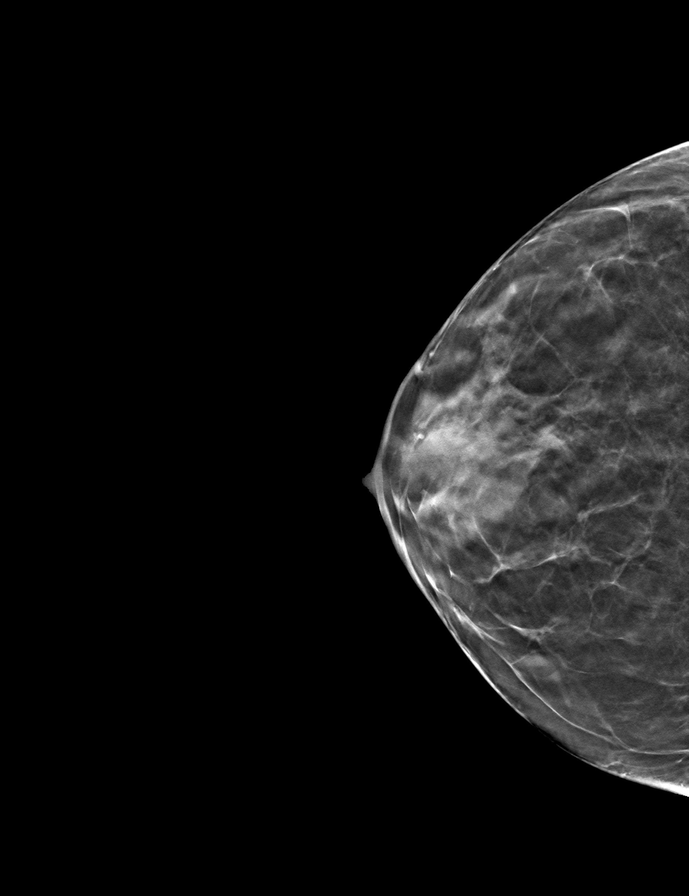

[L CC tomo · tomo slice 21/42.0]
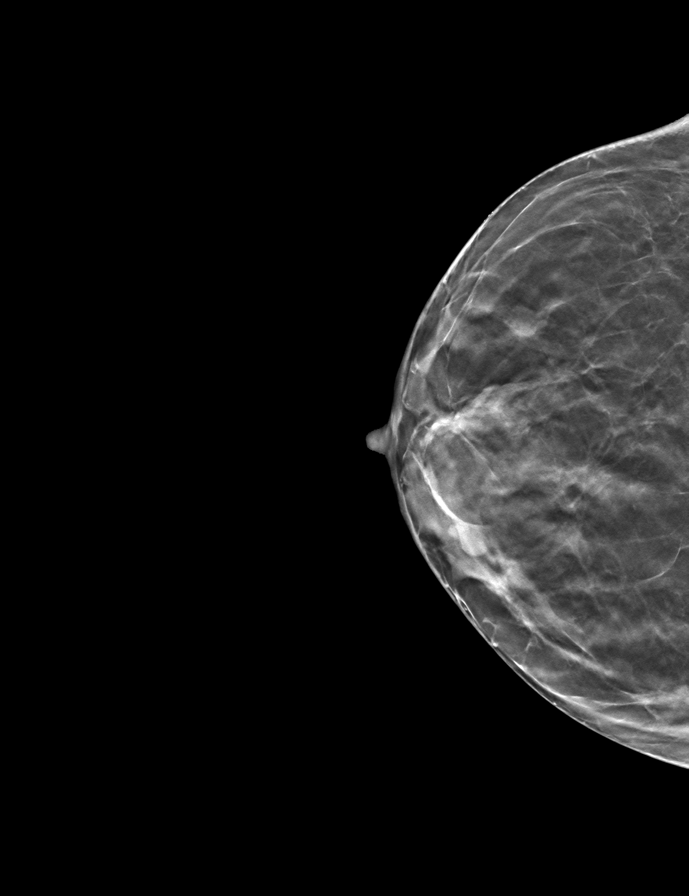

[R MLO tomo · tomo slice 21/42.0]
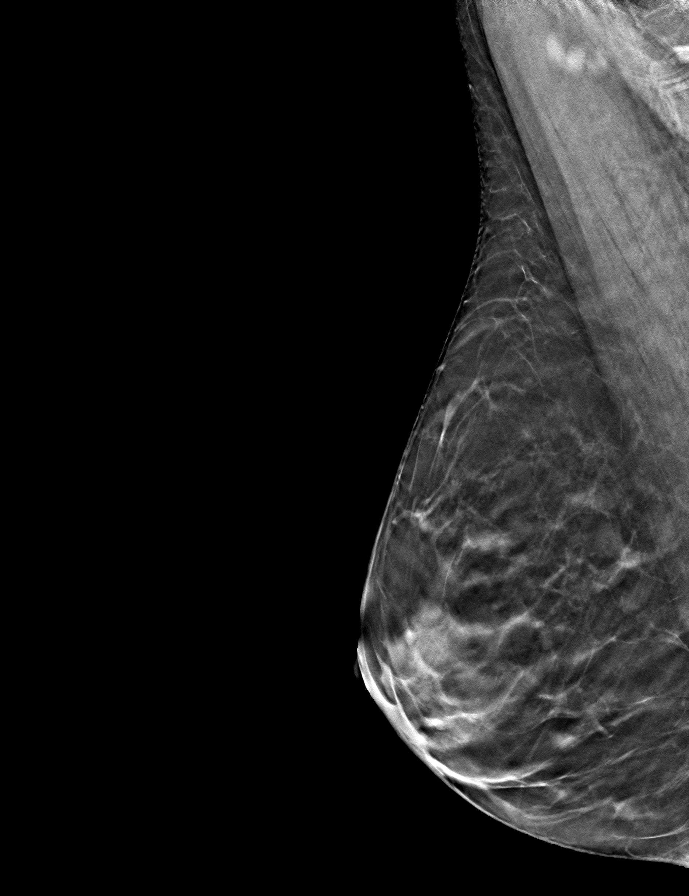

[L MLO tomo · tomo slice 21/42.0]
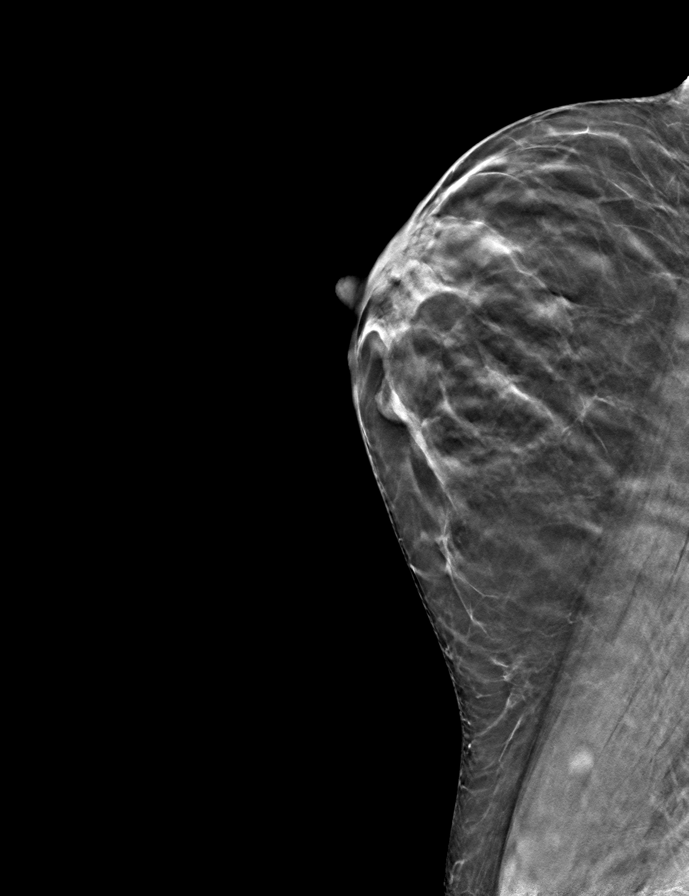

[9 of 24 positions shown; findings below may reference images not displayed]

ACR Breast Density Category b: There are scattered areas of
fibroglandular density.
FINDINGS: In the left breast, a possible mass warrants further evaluation. In
the right breast, no findings suspicious for malignancy.
IMPRESSION: Further evaluation is suggested for possible mass in the left
breast.

RECOMMENDATION:
Ultrasound of the left breast. (Code:B5-G-DD6)

The patient will be contacted regarding the findings, and additional
imaging will be scheduled.

BI-RADS CATEGORY  0: Incomplete. Need additional imaging evaluation
and/or prior mammograms for comparison.

## 2021-11-30 ENCOUNTER — Ambulatory Visit (INDEPENDENT_AMBULATORY_CARE_PROVIDER_SITE_OTHER): Payer: No Typology Code available for payment source | Admitting: Physician Assistant

## 2021-11-30 ENCOUNTER — Encounter: Payer: Self-pay | Admitting: Family Medicine

## 2021-11-30 ENCOUNTER — Encounter: Payer: Self-pay | Admitting: Physician Assistant

## 2021-11-30 VITALS — BP 100/60 | HR 60 | Ht 64.0 in | Wt 118.0 lb

## 2021-11-30 DIAGNOSIS — K5100 Ulcerative (chronic) pancolitis without complications: Secondary | ICD-10-CM

## 2021-11-30 DIAGNOSIS — Z86718 Personal history of other venous thrombosis and embolism: Secondary | ICD-10-CM | POA: Diagnosis not present

## 2021-11-30 DIAGNOSIS — Z7901 Long term (current) use of anticoagulants: Secondary | ICD-10-CM

## 2021-11-30 MED ORDER — NA SULFATE-K SULFATE-MG SULF 17.5-3.13-1.6 GM/177ML PO SOLN: 1.0000 | ORAL | 0 refills | Status: DC

## 2021-11-30 NOTE — Patient Instructions (Signed)
You have been scheduled for a colonoscopy. Please follow written instructions given to you at your visit today.  ?Please pick up your prep supplies at the pharmacy within the next 1-3 days. ?If you use inhalers (even only as needed), please bring them with you on the day of your procedure. ? ?We have sent the following medications to your pharmacy for you to pick up at your convenience: ?[place]  ? ?Thank you for choosing me and [facility]. ? ?Ellouise Newer PA  ?

## 2021-11-30 NOTE — Progress Notes (Signed)
? ?Chief Complaint: Follow-up ulcerative pancolitis ? ?HPI: ?   Annette Hopkins is a 51 year old Caucasian female with a past medical history of ulcerative pancolitis diagnosed in 2003, history of DVT and PE on Xarelto, rheumatoid arthritis and others, known to Dr. Hilarie Fredrickson who presents to clinic today for follow-up of her ulcerative pancolitis. ?   06/21/2018 colonoscopy with mild to moderately active pancolitis most prominent in the right colon, worsened since last exam and small internal hemorrhoids.  Patient was started back on Lialda and told to repeat in 2 years. ?   05/03/2019 patient seen in clinic and was doing wonderfully with regular daily bowel movements are soft solid and no abdominal pain.  She was using her Lialda 2.4 g/day.  That time repeated CBC and CMP and refilled Lialda 2.4 g/day. ?   06/22/2021 CBC with minimally decreased white count at 3.4.  CMP with elevated glucose at 120 and otherwise normal. ?   Today, the patient presents to clinic and tells me that she stopped her Lialda about 6 months ago because she felt like it was causing her hair to fall out.  She has noticed that since stopping it her hair started to grow back in.  Tells me that GI wise she is doing very well with regular bowel movements, no hematochezia and no abdominal pain.  Aware that she is due for her colonoscopy. ?   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 ?   Denies fever, chills, weight loss, blood in her stool, change in bowel habits, nausea or vomiting. ? ?Past Medical History:  ?Diagnosis Date  ? Abnormal LFTs   ? Esophagitis   ? History of blood clots   ?  B leg DVT 2005, abdomen. On Coumadin for life  ? History of blood transfusion   ? after reaction to 6-MP  ? Long term (current) use of anticoagulants   ? Pulmonary embolism ([place])   ? RA (rheumatoid arthritis) ([facility])   ? ~2005  ? Ulcerative colitis   ? dx  around 2004  ? ? ?Past Surgical History:  ?Procedure Laterality Date  ? vascular stents  bilateral groins  2006  ? ? ?Current Outpatient Medications  ?Medication Sig Dispense Refill  ? COVID-19 mRNA bivalent vaccine, Moderna, (MODERNA COVID-19 BIVAL BOOSTER) 50 MCG/0.5ML injection Inject into the muscle. 0.5 mL 0  ? levonorgestrel (MIRENA) 20 MCG/24HR IUD 1 each by Intrauterine route once.    ? traZODone (DESYREL) 50 MG tablet TAKE 1/2 TO 1 TABLET BY MOUTH AT BEDTIME AS NEEDED FOR SLEEP CAN  TAKE UP TO 100 MG IF NEEDED 180 tablet 0  ? XARELTO 20 MG TABS tablet TAKE 1 TABLET BY MOUTH  DAILY WITH DINNER 90 tablet 3  ? ?No current facility-administered medications for this visit.  ? ? ?Allergies as of 11/30/2021 - Review Complete 06/22/2021  ?Allergen Reaction Noted  ? Mercaptopurine Nausea And Vomiting 07/26/2011  ? Sulfa antibiotics Hives 11/03/2010  ? ? ?Family History  ?Problem Relation Age of Onset  ? Colon cancer Paternal Grandmother   ? Colon polyps Paternal Grandmother   ? Breast cancer Mother   ? Deep vein thrombosis Father   ?     between lung and heart  ? CAD Neg Hx   ? Clotting disorder Neg Hx   ? Stomach cancer Neg Hx   ? Esophageal cancer Neg Hx   ? ? ?Social History  ? ?Socioeconomic History  ? Marital status: Married  ?  Spouse name: Not on file  ? Number of children: 1  ? Years of education: Not on file  ? Highest education level: Not on file  ?Occupational History  ? Occupation: Pharmacist, hospital  ?  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 Smoking status: Never  ? Smokeless tobacco: Never  ? Tobacco comments:  ?  Never Used Tobacco  ?Vaping Use  ? Vaping Use: Never used  ?Substance and Sexual Activity  ? Alcohol use: Yes  ?  Alcohol/week: 7.0 standard drinks  ?  Types: 7 Glasses of wine per week  ? Drug use: No  ? Sexual activity: Yes  ?  Partners: Male  ?  Birth control/protection: I.U.D.  ?Other Topics Concern  ? Not on file  ?Social History Narrative  ? Married, 1 child,  2006  ? Moved from [place] ~ July 2011  ? 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    ? ?Social Determinants of Health   ? ?Financial Resource Strain: Not on file  ?Food Insecurity: Not on file  ?Transportation Needs: Not on file  ?Physical Activity: Not on file  ?Stress: Not on file  ?Social Connections: Not on file  ?Intimate Partner Violence: Not on file  ? ? ?Review of Systems:    ?Constitutional: No weight loss, fever or chills ?Cardiovascular: No chest pain ?Respiratory: No SOB  ?Gastrointestinal: See HPI and otherwise negative ? ? Physical Exam:  ?Vital signs: ?BP 100/60   Pulse 60   Ht 5' 4"  (1.626 m)   Wt 118 lb (53.5 kg)   BMI 20.25 kg/m?   ? ?Constitutional:   Pleasant Caucasian female appears to be in NAD, Well developed, Well nourished, alert and cooperative ?Head:  Normocephalic and atraumatic. ?Eyes:   PEERL, EOMI. No icterus. Conjunctiva pink. ?Ears:  Normal auditory acuity. ?Neck:  Supple ?Throat: Oral cavity and pharynx without inflammation, swelling or lesion.  ?Respiratory: Respirations even and unlabored. Lungs clear to auscultation bilaterally.   No wheezes, crackles, or rhonchi.  ?Cardiovascular: Normal S1, S2. No MRG. Regular rate and rhythm. No peripheral edema, cyanosis or pallor.  ?Gastrointestinal:  Soft, nondistended, nontender. No rebound or guarding. Normal bowel sounds. No appreciable masses or hepatomegaly. ?Rectal:  Not performed.  ?Msk:  Symmetrical without gross deformities. Without edema, no deformity or joint abnormality.  ?Neurologic:  Alert and  oriented x4;  grossly normal neurologically.  ?Skin:   Dry and intact without significant lesions or rashes. ?Psychiatric: Demonstrates good judgement and reason without abnormal affect or behaviors. ? ?RELEVANT LABS AND IMAGING: ?CBC ?   ?Component Value Date/Time  ? WBC 3.4 (L) 06/22/2021 1509  ? RBC 4.51 06/22/2021 1509  ? HGB 13.2 06/22/2021 1509  ? HGB 13.6 07/16/2015 1523  ? HGB 14.1 08/02/2011 1207  ? HCT 39.1 06/22/2021 1509  ? HCT 39.9 07/16/2015 1523  ? HCT 41.3 08/02/2011 1207  ? PLT 153.0 06/22/2021 1509  ? PLT 171 07/16/2015 1523  ? PLT  172 08/02/2011 1207  ? MCV 86.9 06/22/2021 1509  ? MCV 88 07/16/2015 1523  ? MCV 90.9 08/02/2011 1207  ? MCH 30.0 07/16/2015 1523  ? MCHC 33.7 06/22/2021 1509  ? RDW 13.4 06/22/2021 1509  ? RDW 12.7 07/16/2015 1523  ? RDW 12.7 08/02/2011 1207  ? LYMPHSABS 2.0 05/03/2019 1620  ? LYMPHSABS 2.5 07/16/2015 1523  ? LYMPHSABS 1.7 08/02/2011 1207  ? MONOABS 0.5 05/03/2019 1620  ? MONOABS 0.6 08/02/2011 1207  ? EOSABS 0.2 05/03/2019 1620  ? EOSABS  0.1 07/16/2015 1523  ? BASOSABS 0.1 05/03/2019 1620  ? BASOSABS 0.0 07/16/2015 1523  ? BASOSABS 0.0 08/02/2011 1207  ? ? ?CMP  ?   ?Component Value Date/Time  ? NA 137 06/22/2021 1509  ? K 3.8 06/22/2021 1509  ? CL 101 06/22/2021 1509  ? CO2 30 06/22/2021 1509  ? GLUCOSE 120 (H) 06/22/2021 1509  ? BUN 6 06/22/2021 1509  ? CREATININE 0.64 06/22/2021 1509  ? CALCIUM 8.4 06/22/2021 1509  ? PROT 6.4 06/22/2021 1509  ? ALBUMIN 3.8 06/22/2021 1509  ? AST 27 06/22/2021 1509  ? ALT 27 06/22/2021 1509  ? ALKPHOS 78 06/22/2021 1509  ? BILITOT 0.4 06/22/2021 1509  ? ? ?Assessment: ?1.  Ulcerative pancolitis: Last colonoscopy in 2019 with active colitis, at that time restarted on Lialda, since being seen last patient stopped her Lialda about 6 months ago, she is not having any symptoms from her ulcerative colitis, does tell me that she is noticing some hair loss with the Lialda and since stopping it she has started to regrow her hair ?2.  History of DVT and PE: On Xarelto ? ?Plan: ?1.  Discussed with the patient that pending results from upcoming colonoscopy she may be requested to go back on maintenance medication depending on what her biopsies look like.  At that time would recommend that she discuss alternatives with Dr. Hilarie Fredrickson given the side effect of hair loss. ?2.  Scheduled patient for a colonoscopy due to her history of ulcerative pancolitis.  Patient was scheduled Dr. Hilarie Fredrickson in the [facility].  Did provide the patient a detailed list of risks for the procedure and she agrees to proceed.  Patient is appropriate for endoscopic procedure(s) in the ambulatory ([facility]) setting.  ?3.  Patient advised to hold her Xarelto for 2 days prior to time of procedure.  We will communicate with the prescribing physi

## 2021-12-01 ENCOUNTER — Encounter: Payer: Self-pay | Admitting: Family Medicine

## 2021-12-01 ENCOUNTER — Ambulatory Visit (INDEPENDENT_AMBULATORY_CARE_PROVIDER_SITE_OTHER): Payer: No Typology Code available for payment source | Admitting: Family Medicine

## 2021-12-01 VITALS — BP 98/59 | HR 86 | Temp 98.6°F | Ht 64.0 in | Wt 117.2 lb

## 2021-12-01 DIAGNOSIS — J029 Acute pharyngitis, unspecified: Secondary | ICD-10-CM

## 2021-12-01 DIAGNOSIS — J0140 Acute pansinusitis, unspecified: Secondary | ICD-10-CM | POA: Diagnosis not present

## 2021-12-01 LAB — POCT RAPID STREP A (OFFICE): Rapid Strep A Screen: NEGATIVE

## 2021-12-01 MED ORDER — AMOXICILLIN-POT CLAVULANATE 875-125 MG PO TABS: 1.0000 | ORAL_TABLET | Freq: Two times a day (BID) | ORAL | 0 refills | Status: AC

## 2021-12-01 NOTE — Patient Instructions (Addendum)
Starting Augmentin - see education handout for more information. ?Continue supportive measures including rest, hydration, humidifier use, steam showers, warm compresses to sinuses, warm liquids with lemon and honey, and over-the-counter cough, cold, and analgesics as needed.   ? ?Please contact office for follow-up if symptoms do not improve or worsen. Seek emergency care if symptoms become severe. ? ?

## 2021-12-01 NOTE — Progress Notes (Signed)
? ?Acute Office Visit ? ?Subjective:  ? ?  ?Patient ID: Annette Hopkins, female    DOB: 1971-02-08, 51 y.o.   MRN: [id number] ? ?CC: URI, sore throat ? ? ?HPI ?Patient is in today for sore throat and URI symptoms.  ? ?Patient states that for over a week now she has had sore throat, swollen tonsils, headache, body aches, fatigue, mild cough, post nasal drainage, rhinorrhea, nasal congestion, sinus pressure, headaches. 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 She denies any tonsillar exudate, trouble breathing, chest pain, dyspnea, wheezing, rashes, GI/GU symptoms. States she usually does not have spring allergies.  ? ? ? ?ROS ?All review of systems negative except what is listed in the HPI ? ? ?   ?Objective:  ?  ?BP (!) 98/59   Pulse 86   Temp 98.6 ?F (37 ?C)   Ht 5' 4"  (1.626 m)   Wt 117 lb 3.2 oz (53.2 kg)   BMI 20.12 kg/m?  ? ? ?Physical Exam ?Vitals reviewed.  ?Constitutional:   ?   Appearance: Normal appearance. She is normal weight.  ?HENT:  ?   Head: Normocephalic and atraumatic.  ?   Right Ear: Tympanic membrane normal.  ?   Left Ear: Tympanic membrane normal.  ?   Nose: Rhinorrhea present.  ?   Mouth/Throat:  ?   Mouth: Mucous membranes are moist.  ?   Pharynx: Oropharynx is clear. Posterior oropharyngeal erythema present. No oropharyngeal exudate.  ?   Tonsils: No tonsillar exudate or tonsillar abscesses. 1+ on the right. 1+ on the left.  ?Cardiovascular:  ?   Rate and Rhythm: Normal rate and regular rhythm.  ?Pulmonary:  ?   Effort: Pulmonary effort is normal.  ?   Breath sounds: Normal breath sounds. No wheezing, rhonchi or rales.  ?Musculoskeletal:  ?   Cervical back: Normal range of motion and neck supple. No tenderness.  ?Lymphadenopathy:  ?   Cervical: Cervical adenopathy present.  ?Skin: ?   General: Skin is warm and dry.  ?   Findings: No rash.  ?Neurological:  ?   General: No focal deficit present.  ?   Mental Status: She is alert and  oriented to person, place, and time. Mental status is at baseline.  ?Psychiatric:     ?   Mood and Affect: Mood normal.     ?   Behavior: Behavior normal.     ?   Thought Content: Thought content normal.     ?   Judgment: Judgment normal.  ? ? ?Results for orders placed or performed in visit on 12/01/21  ?POCT rapid strep A  ?Result Value Ref Range  ? Rapid Strep A Screen Negative Negative  ? ? ? ?   ?Assessment & Plan:  ? ?1. Sore throat ?2. Acute non-recurrent pansinusitis ?Rapid strep negative. Given duration of symptoms will go ahead and treat as ABRS with Augmentin (med discussed and education provided). Continue supportive measures including rest, hydration, humidifier use, steam showers, warm compresses to sinuses, warm liquids with lemon and honey, and over-the-counter cough, cold, and analgesics as needed.   ?Patient aware of signs/symptoms requiring further/urgent evaluation.  ? ?- POCT rapid strep A ?- amoxicillin-clavulanate (AUGMENTIN) 875-125 MG tablet; Take 1 tablet by mouth 2 (two) times daily for 10 days.  Dispense: 20 tablet; Refill: 0 ? ? ? ?Return if symptoms worsen or fail to improve. ? ?Terrilyn Saver, NP ? ? ?

## 2021-12-03 ENCOUNTER — Telehealth: Payer: Self-pay

## 2021-12-03 NOTE — Telephone Encounter (Signed)
-----   Message from Darreld Mclean, MD sent at 12/03/2021  5:36 AM EDT ----- ?Regarding: colonscopy ?Hi Annette Hopkins- Annette Hopkins can come off her Xarelto 48 hours prior to her colonoscopy procedure.  However, we do plan to use Lovenox bridge for her due to higher risk of clotting/ blood clotting disorder and patient preference.  I will need the gastroenterologist to advise her when she can re-start her anti-coagulation after the procedure ? ?JC ? ?

## 2021-12-03 NOTE — Progress Notes (Signed)
Addendum: ?Reviewed and agree with assessment and management plan. ?Per prescribing Xarelto provider she will be given Lovenox bridge to procedure. ?Emmarose Klinke, Lajuan Lines, MD ? ?

## 2021-12-03 NOTE — Telephone Encounter (Signed)
Will do ?Thanks for notification ? ?

## 2021-12-04 ENCOUNTER — Encounter: Payer: Self-pay | Admitting: Family Medicine

## 2021-12-06 MED ORDER — ENOXAPARIN SODIUM 100 MG/ML IJ SOSY: 1.0000 mg/kg | PREFILLED_SYRINGE | Freq: Two times a day (BID) | INTRAMUSCULAR | 0 refills | Status: DC

## 2021-12-09 ENCOUNTER — Other Ambulatory Visit: Payer: Self-pay | Admitting: Family Medicine

## 2021-12-09 DIAGNOSIS — D689 Coagulation defect, unspecified: Secondary | ICD-10-CM

## 2021-12-15 ENCOUNTER — Other Ambulatory Visit: Payer: Self-pay | Admitting: Family Medicine

## 2021-12-15 DIAGNOSIS — F5102 Adjustment insomnia: Secondary | ICD-10-CM

## 2022-01-13 ENCOUNTER — Encounter: Payer: No Typology Code available for payment source | Admitting: Internal Medicine

## 2022-01-25 ENCOUNTER — Encounter: Payer: Self-pay | Admitting: Internal Medicine

## 2022-01-28 ENCOUNTER — Encounter: Payer: Self-pay | Admitting: Internal Medicine

## 2022-01-28 ENCOUNTER — Ambulatory Visit (AMBULATORY_SURGERY_CENTER): Payer: No Typology Code available for payment source | Admitting: Internal Medicine

## 2022-01-28 ENCOUNTER — Other Ambulatory Visit: Payer: Self-pay | Admitting: Internal Medicine

## 2022-01-28 VITALS — BP 110/68 | HR 77 | Temp 98.4°F | Resp 16 | Ht 64.0 in | Wt 118.0 lb

## 2022-01-28 DIAGNOSIS — K648 Other hemorrhoids: Secondary | ICD-10-CM

## 2022-01-28 DIAGNOSIS — K5100 Ulcerative (chronic) pancolitis without complications: Secondary | ICD-10-CM | POA: Diagnosis not present

## 2022-01-28 DIAGNOSIS — K5150 Left sided colitis without complications: Secondary | ICD-10-CM | POA: Diagnosis not present

## 2022-01-28 DIAGNOSIS — K529 Noninfective gastroenteritis and colitis, unspecified: Secondary | ICD-10-CM | POA: Diagnosis not present

## 2022-01-28 MED ORDER — SODIUM CHLORIDE 0.9 % IV SOLN
500.0000 mL | Freq: Once | INTRAVENOUS | Status: DC
Start: 2022-01-28 — End: 2022-01-28

## 2022-01-28 NOTE — Progress Notes (Signed)
Called to room to assist during endoscopic procedure.  Patient ID and intended procedure confirmed with present staff. Received instructions for my participation in the procedure from the performing physician.  

## 2022-01-28 NOTE — Progress Notes (Signed)
GASTROENTEROLOGY PROCEDURE H&P NOTE   Primary Care Physician: Darreld Mclean, MD    Reason for Procedure:  History of longstanding pan ulcerative colitis  Plan:    Colonoscopy  Patient is appropriate for endoscopic procedure(s) in the ambulatory ([facility]) setting.  The nature of the procedure, as well as the risks, benefits, and alternatives were carefully and thoroughly reviewed with the patient. Ample time for discussion and questions allowed. The patient understood, was satisfied, and agreed to proceed.     HPI: Annette Hopkins is a 51 y.o. female who presents for surveillance colonoscopy.  Medical history as below.  Tolerated the prep.  No recent chest pain or shortness of breath.  No abdominal pain today.  Past Medical History:  Diagnosis Date   Abnormal LFTs    Clotting disorder (HCC)    Esophagitis    History of blood clots     B leg DVT 2005, abdomen. On Coumadin for life   History of blood transfusion    after reaction to 6-MP   Long term (current) use of anticoagulants    Pulmonary embolism (HCC)    RA (rheumatoid arthritis) ([place])    ~2005   Ulcerative colitis    dx  around 2004    Past Surgical History:  Procedure Laterality Date   vascular stents bilateral groins  2006    Prior to Admission medications   Medication Sig Start Date End Date Taking? Authorizing Provider  traZODone (DESYREL) 50 MG tablet TAKE 1/2 TO 1 TABLET BY MOUTH AT BEDTIME AS NEEDED FOR SLEEP .  MAY TAKE UP TO 2 TABLETS IF  NEEDED 12/15/21  Yes Copland, Gay Filler, MD  COVID-19 mRNA bivalent vaccine, Moderna, (MODERNA COVID-19 BIVAL BOOSTER) 50 MCG/0.5ML injection Inject into the muscle. 07/21/21   Carlyle Basques, MD  enoxaparin (LOVENOX) 100 MG/ML injection Inject 0.525 mLs (52.5 mg total) into the skin every 12 (twelve) hours. Patient not taking: Reported on 01/28/2022 12/06/21   Copland, Gay Filler, MD  levonorgestrel (MIRENA) 20 MCG/24HR IUD 1 each by Intrauterine route once.     [provider]  XARELTO 20 MG TABS tablet TAKE 1 TABLET BY MOUTH  DAILY WITH DINNER 12/10/21   Copland, Gay Filler, MD    Current Outpatient Medications  Medication Sig Dispense Refill   traZODone (DESYREL) 50 MG tablet TAKE 1/2 TO 1 TABLET BY MOUTH AT BEDTIME AS NEEDED FOR SLEEP .  MAY TAKE UP TO 2 TABLETS IF  NEEDED 180 tablet 3   COVID-19 mRNA bivalent vaccine, Moderna, (MODERNA COVID-19 BIVAL BOOSTER) 50 MCG/0.5ML injection Inject into the muscle. 0.5 mL 0   enoxaparin (LOVENOX) 100 MG/ML injection Inject 0.525 mLs (52.5 mg total) into the skin every 12 (twelve) hours. (Patient not taking: Reported on 01/28/2022) 4 mL 0   levonorgestrel (MIRENA) 20 MCG/24HR IUD 1 each by Intrauterine route once.     XARELTO 20 MG TABS tablet TAKE 1 TABLET BY MOUTH  DAILY WITH DINNER 90 tablet 3   Current Facility-Administered Medications  Medication Dose Route Frequency Provider Last Rate Last Admin   0.9 %  sodium chloride infusion  500 mL Intravenous Once Jahid Weida, Lajuan Lines, MD        Allergies as of 01/28/2022 - Review Complete 01/28/2022  Allergen Reaction Noted   Mercaptopurine Nausea And Vomiting 07/26/2011   Sulfa antibiotics Hives 11/03/2010    Family History  Problem Relation Age of Onset   Colon cancer Paternal Grandmother    Colon polyps Paternal Grandmother  Breast cancer Mother    Deep vein thrombosis Father        between lung and heart   CAD Neg Hx    Clotting disorder Neg Hx    Stomach cancer Neg Hx    Esophageal cancer Neg Hx     Social History   Socioeconomic History   Marital status: Married    Spouse name: Not on file   Number of children: 1   Years of education: Not on file   Highest education level: Not on file  Occupational History   Occupation: Pharmacist, 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  Other Topics Concern   Not on file  Social History Narrative   Married, 1 child,  2006   Moved from [place] ~ July 2011   Works @ [employer]-- Pharmacist, hospital , Scientist, research (life sciences)       Social Determinants of Radio broadcast assistant Strain: Not on [employer] Insecurity: Not on file  Transportation Needs: Not on file  Physical Activity: Not on file  Stress: Not on file  Social Connections: Not on file  Intimate Partner Violence: Not on file    Physical Exam: Vital signs in last 24 hours: @BP  (!) 90/52   Pulse 90   Temp 98.4 F (36.9 C)   Ht 5' 4"  (1.626 m)   Wt 118 lb (53.5 kg)   SpO2 100%   BMI 20.25 kg/m  GEN: NAD EYE: Sclerae anicteric ENT: MMM CV: Non-tachycardic Pulm: CTA b/l GI: Soft, NT/ND NEURO:  Alert & Oriented x 3   Zenovia Jarred, MD [facility]  01/28/2022 2:28 PM

## 2022-01-28 NOTE — Op Note (Addendum)
[name] Procedure Date: 01/28/2022 2:24 PM MRN: [id number] Endoscopist: Jerene Bears , MD Age: 51 Referring MD:  Date of Birth: 1970-09-26 Gender: Female Account #: [id number] Procedure:                Colonoscopy Indications:              High risk colon cancer surveillance: Ulcerative                            pancolitis of 8 (or more) years duration, Last                            colonoscopy: November 2019; previously on Lialda                            stopped 7 months ago due to hair loss Medicines:                Monitored Anesthesia Care Procedure:                Pre-Anesthesia Assessment:                           - Prior to the procedure, a History and Physical                            was performed, and patient medications and                            allergies were reviewed. The patient's tolerance of                            previous anesthesia was also reviewed. The risks                            and benefits of the procedure and the sedation                            options and risks were discussed with the patient.                            All questions were answered, and informed consent                            was obtained. Prior Anticoagulants: The patient has                            taken Xarelto (rivaroxaban), last dose was 2 days                            prior to procedure. ASA Grade Assessment: II - A                            patient with mild systemic disease. After reviewing  the risks and benefits, the patient was deemed in                            satisfactory condition to undergo the procedure.                           After obtaining informed consent, the colonoscope                            was passed under direct vision. Throughout the                            procedure, the patient's blood pressure, pulse, and                            oxygen saturations  were monitored continuously. The                            Olympus PCF-H190DL (#[id number]) Colonoscope was                            introduced through the anus and advanced to the                            terminal ileum. The colonoscopy was technically                            difficult and complex due to significant looping                            and a tortuous colon. Successful completion of the                            procedure was aided by changing the patient to a                            supine position. The patient tolerated the                            procedure well. The quality of the bowel                            preparation was good. The terminal ileum, ileocecal                            valve, appendiceal orifice, and rectum were                            photographed. Scope In: 2:39:29 PM Scope Out: 3:02:28 PM Scope Withdrawal Time: 0 hours 10 minutes 37 seconds  Total Procedure Duration: 0 hours 22 minutes 59 seconds  Findings:                 The digital rectal exam was normal.  The terminal ileum appeared normal.                           Inflammation was found in a continuous and                            circumferential pattern from the sigmoid colon to                            the cecum. This was graded as Mayo Score 2                            (moderate, with marked erythema, absent vascular                            pattern, friability, erosions), and when compared                            to the previous examination, the findings are                            unchanged. Four biopsies were taken every 10 cm                            with a cold forceps from the entire colon for                            dysplasia surveillance and ulcerative colitis                            surveillance. These biopsy specimens from the right                            colon, left colon and transverse colon were sent to                             Pathology.                           Internal hemorrhoids were found during                            retroflexion. The hemorrhoids were small. Complications:            No immediate complications. Estimated Blood Loss:     Estimated blood loss was minimal. Impression:               - The examined portion of the ileum was normal.                           - Moderately active (Mayo Score 2) pancolitis                            ulcerative colitis, unchanged since the last  examination (inflammation more significant in                            transverse and right colon, but present                            throughout). Biopsied.                           - Small internal hemorrhoids. Recommendation:           - Patient has a contact number available for                            emergencies. The signs and symptoms of potential                            delayed complications were discussed with the                            patient. Return to normal activities tomorrow.                            Written discharge instructions were provided to the                            patient.                           - Resume previous diet.                           - Continue present medications.                           - Resume Xarelto (rivaroxaban) at prior dose today.                            Refer to managing physician for further adjustment                            of therapy.                           - Await pathology results.                           - Office follow-up recommended. I would recommend                            consideration of Entyvio given side-effects from                            Lialda and presence of ongoing active colitis.                           - Repeat colonoscopy for surveillance based on  pathology results. Jerene Bears, MD 01/28/2022 3:08:42 PM This report has been signed  electronically.

## 2022-01-28 NOTE — Progress Notes (Signed)
Patient complaint pain in left leg, states "this always happens when stopping xarelto. No swelling or redness noted. O2 sats 100%. Negative Homan's sign. Dr Mechele Collin informed, states okay to proceed.

## 2022-01-28 NOTE — Patient Instructions (Addendum)
Restart Xarelto Today   Await pathology results.   Continue current medications.   YOU HAD AN ENDOSCOPIC PROCEDURE TODAY AT [facility]:   Refer to the procedure report that was given to you for any specific questions about what was found during the examination.  If the procedure report does not answer your questions, please call your gastroenterologist to clarify.  If you requested that your care partner not be given the details of your procedure findings, then the procedure report has been included in a sealed envelope for you to review at your convenience later.  YOU SHOULD EXPECT: Some feelings of bloating in the abdomen. Passage of more gas than usual.  Walking can help get rid of the air that was put into your GI tract during the procedure and reduce the bloating. If you had a lower endoscopy (such as a colonoscopy or flexible sigmoidoscopy) you may notice spotting of blood in your stool or on the toilet paper. If you underwent a bowel prep for your procedure, you may not have a normal bowel movement for a few days.  Please Note:  You might notice some irritation and congestion in your nose or some drainage.  This is from the oxygen used during your procedure.  There is no need for concern and it should clear up in a day or so.  SYMPTOMS TO REPORT IMMEDIATELY:  Following lower endoscopy (colonoscopy or flexible sigmoidoscopy):  Excessive amounts of blood in the stool  Significant tenderness or worsening of abdominal pains  Swelling of the abdomen that is new, acute  Fever of 100F or higher  For urgent or emergent issues, a gastroenterologist can be reached at any hour by calling [phone]. Do not use MyChart messaging for urgent concerns.    DIET:  We do recommend a small meal at first, but then you may proceed to your regular diet.  Drink plenty of fluids but you should avoid alcoholic beverages for 24 hours.  ACTIVITY:  You should plan to take it easy for the  rest of today and you should NOT DRIVE or use heavy machinery until tomorrow (because of the sedation medicines used during the test).    FOLLOW UP: Our staff will call the number listed on your records the next business day following your procedure.  We will call around 7:15- 8:00 am to check on you and address any questions or concerns that you may have regarding the information given to you following your procedure. If we do not reach you, we will leave a message.  If you develop any symptoms (ie: fever, flu-like symptoms, shortness of breath, cough etc.) before then, please call [phone].  If you test positive for Covid 19 in the 2 weeks post procedure, please call and report this information to [place].    If any biopsies were taken you will be contacted by phone or by letter within the next 1-3 weeks.  Please call us at [phone] if you have not heard about the biopsies in 3 weeks.    SIGNATURES/CONFIDENTIALITY: You and/or your care partner have signed paperwork which will be entered into your electronic medical record.  These signatures attest to the fact that that the information above on your After Visit Summary has been reviewed and is understood.  Full responsibility of the confidentiality of this discharge information lies with you and/or your care-partner.

## 2022-01-28 NOTE — Progress Notes (Signed)
Sedate, gd SR, tolerated procedure well, VSS, report to RN 

## 2022-01-29 ENCOUNTER — Telehealth: Payer: Self-pay

## 2022-01-29 NOTE — Telephone Encounter (Signed)
Follow up call placed, VM obtained and message left. ?SChaplin, RN,BSN ? ?

## 2022-02-01 ENCOUNTER — Other Ambulatory Visit: Payer: Self-pay

## 2022-02-01 ENCOUNTER — Telehealth: Payer: Self-pay | Admitting: Internal Medicine

## 2022-02-01 MED ORDER — MESALAMINE 1.2 G PO TBEC: 4.8000 g | DELAYED_RELEASE_TABLET | Freq: Every day | ORAL | 5 refills | Status: DC

## 2022-02-01 NOTE — Telephone Encounter (Signed)
Spoke with pt and let her know Lialda was sent to pharmacy. Pt verbalized understanding and had no other concerns at end of call.

## 2022-02-01 NOTE — Telephone Encounter (Signed)
Pt has a history of UC and states she was just ending a flare before colonoscopy on 01/28/22 and since colonoscopy pt states she seems to be flaring again. Pt states she stopped taking lialda in November 2022 due to hair loss and she felt like she didn't need it at that time. Pt requesting a refill on lialda now because pt states she is having 10 Bms per day.

## 2022-02-03 ENCOUNTER — Other Ambulatory Visit: Payer: Self-pay | Admitting: Physician Assistant

## 2022-02-04 ENCOUNTER — Other Ambulatory Visit: Payer: Self-pay

## 2022-02-04 DIAGNOSIS — R197 Diarrhea, unspecified: Secondary | ICD-10-CM

## 2022-02-04 DIAGNOSIS — K5100 Ulcerative (chronic) pancolitis without complications: Secondary | ICD-10-CM

## 2022-02-04 MED ORDER — DICYCLOMINE HCL 20 MG PO TABS: 20.0000 mg | ORAL_TABLET | Freq: Three times a day (TID) | ORAL | 3 refills | Status: DC | PRN

## 2022-02-04 MED ORDER — PREDNISONE 10 MG PO TABS: ORAL_TABLET | ORAL | 0 refills | Status: AC

## 2022-02-05 ENCOUNTER — Other Ambulatory Visit: Payer: Self-pay | Admitting: Family Medicine

## 2022-02-05 ENCOUNTER — Other Ambulatory Visit (INDEPENDENT_AMBULATORY_CARE_PROVIDER_SITE_OTHER): Payer: No Typology Code available for payment source

## 2022-02-05 ENCOUNTER — Ambulatory Visit (INDEPENDENT_AMBULATORY_CARE_PROVIDER_SITE_OTHER)
Admission: RE | Admit: 2022-02-05 | Discharge: 2022-02-05 | Disposition: A | Discharge status: Home / Self Care | Payer: Self-pay | Source: Ambulatory Visit | Attending: Internal Medicine | Admitting: Internal Medicine

## 2022-02-05 DIAGNOSIS — K5100 Ulcerative (chronic) pancolitis without complications: Secondary | ICD-10-CM

## 2022-02-05 DIAGNOSIS — R197 Diarrhea, unspecified: Secondary | ICD-10-CM

## 2022-02-05 DIAGNOSIS — Z1231 Encounter for screening mammogram for malignant neoplasm of breast: Secondary | ICD-10-CM

## 2022-02-05 LAB — CBC WITH DIFFERENTIAL/PLATELET
Basophils Absolute: 0 10*3/uL (ref 0.0–0.1)
Basophils Relative: 0.2 % (ref 0.0–3.0)
Eosinophils Absolute: 0 10*3/uL (ref 0.0–0.7)
Eosinophils Relative: 0.3 % (ref 0.0–5.0)
HCT: 42.3 % (ref 36.0–46.0)
Hemoglobin: 14.3 g/dL (ref 12.0–15.0)
Lymphocytes Relative: 6.8 % — ABNORMAL LOW (ref 12.0–46.0)
Lymphs Abs: 0.6 10*3/uL — ABNORMAL LOW (ref 0.7–4.0)
MCHC: 33.7 g/dL (ref 30.0–36.0)
MCV: 89.1 fl (ref 78.0–100.0)
Monocytes Absolute: 0.2 10*3/uL (ref 0.1–1.0)
Monocytes Relative: 2 % — ABNORMAL LOW (ref 3.0–12.0)
Neutro Abs: 8.3 10*3/uL — ABNORMAL HIGH (ref 1.4–7.7)
Neutrophils Relative %: 90.7 % — ABNORMAL HIGH (ref 43.0–77.0)
Platelets: 228 10*3/uL (ref 150.0–400.0)
RBC: 4.74 Mil/uL (ref 3.87–5.11)
RDW: 12.7 % (ref 11.5–15.5)
WBC: 9.1 10*3/uL (ref 4.0–10.5)

## 2022-02-05 LAB — COMPREHENSIVE METABOLIC PANEL
ALT: 12 U/L (ref 0–35)
AST: 15 U/L (ref 0–37)
Albumin: 4.2 g/dL (ref 3.5–5.2)
Alkaline Phosphatase: 64 U/L (ref 39–117)
BUN: 7 mg/dL (ref 6–23)
CO2: 27 mEq/L (ref 19–32)
Calcium: 9.5 mg/dL (ref 8.4–10.5)
Chloride: 97 mEq/L (ref 96–112)
Creatinine, Ser: 0.83 mg/dL (ref 0.40–1.20)
GFR: 82.79 mL/min (ref >60.00)
Glucose, Bld: 129 mg/dL — ABNORMAL HIGH (ref 70–99)
Potassium: 3.6 mEq/L (ref 3.5–5.1)
Sodium: 131 mEq/L — ABNORMAL LOW (ref 135–145)
Total Bilirubin: 0.5 mg/dL (ref 0.2–1.2)
Total Protein: 7.6 g/dL (ref 6.0–8.3)

## 2022-02-08 ENCOUNTER — Other Ambulatory Visit: Payer: Self-pay

## 2022-02-08 DIAGNOSIS — K5100 Ulcerative (chronic) pancolitis without complications: Secondary | ICD-10-CM

## 2022-02-08 DIAGNOSIS — R197 Diarrhea, unspecified: Secondary | ICD-10-CM

## 2022-02-08 LAB — HEPATITIS B CORE ANTIBODY, TOTAL: Hep B Core Total Ab: NONREACTIVE

## 2022-02-08 LAB — HEPATITIS B SURFACE ANTIBODY,QUALITATIVE: Hep B S Ab: NONREACTIVE

## 2022-02-08 LAB — HEPATITIS B SURFACE ANTIGEN: Hepatitis B Surface Ag: NONREACTIVE

## 2022-02-09 LAB — CALPROTECTIN, FECAL

## 2022-02-09 LAB — SPECIMEN STATUS REPORT

## 2022-02-10 LAB — QUANTIFERON-TB GOLD PLUS
QuantiFERON Mitogen Value: 2.93 IU/mL
QuantiFERON Nil Value: 0 IU/mL
QuantiFERON TB1 Ag Value: 0 IU/mL
QuantiFERON TB2 Ag Value: 0.02 IU/mL
QuantiFERON-TB Gold Plus: NEGATIVE

## 2022-02-10 LAB — CLOSTRIDIUM DIFFICILE BY PCR

## 2022-02-13 LAB — SPECIMEN STATUS REPORT

## 2022-02-13 LAB — CLOSTRIDIUM DIFFICILE EIA: C difficile Toxins A+B, EIA: NEGATIVE

## 2022-02-13 LAB — CALPROTECTIN, FECAL: Calprotectin, Fecal: 1210 ug/g — ABNORMAL HIGH (ref 0–120)

## 2022-02-14 LAB — STOOL CULTURE: E coli, Shiga toxin Assay: NEGATIVE

## 2022-02-14 LAB — SPECIMEN STATUS REPORT

## 2022-02-19 ENCOUNTER — Encounter: Payer: Self-pay | Admitting: *Deleted

## 2022-03-12 ENCOUNTER — Ambulatory Visit: Payer: Self-pay

## 2022-03-16 ENCOUNTER — Encounter: Payer: Self-pay | Admitting: Internal Medicine

## 2022-03-16 ENCOUNTER — Ambulatory Visit (INDEPENDENT_AMBULATORY_CARE_PROVIDER_SITE_OTHER): Payer: 59 | Admitting: Internal Medicine

## 2022-03-16 VITALS — BP 92/60 | HR 87 | Ht 64.0 in | Wt 113.0 lb

## 2022-03-16 DIAGNOSIS — Z86711 Personal history of pulmonary embolism: Secondary | ICD-10-CM | POA: Diagnosis not present

## 2022-03-16 DIAGNOSIS — Z7901 Long term (current) use of anticoagulants: Secondary | ICD-10-CM | POA: Diagnosis not present

## 2022-03-16 DIAGNOSIS — K5100 Ulcerative (chronic) pancolitis without complications: Secondary | ICD-10-CM

## 2022-03-16 DIAGNOSIS — K5190 Ulcerative colitis, unspecified, without complications: Secondary | ICD-10-CM | POA: Insufficient documentation

## 2022-03-16 NOTE — Patient Instructions (Signed)
Continue Lialda 4.8 g daily.   Continue your prednisone taper.   Please follow up with Dr. Hilarie Fredrickson in 3-4 months. We do not have a schedule out for November or December. Please call back in a month to schedule.   Dr. Hilarie Fredrickson is going to start Entyvio.   The [facility] providers would like to encourage you to use [facility] to communicate with providers for non-urgent requests or questions.  Due to long hold times on the telephone, sending your provider a message by [facility] may be a faster and more efficient way to get a response.  Please allow 48 business hours for a response.  Please remember that this is for non-urgent requests.

## 2022-03-16 NOTE — Progress Notes (Signed)
Subjective:    Patient ID: Annette Hopkins, female    DOB: 1970/11/08, 51 y.o.   MRN: [id number]  HPI [name] is a 51 year old female with a history of pan ulcerative colitis (diagnosis 2003), history of DVT and PE on Xarelto, rheumatoid arthritis who is here for follow-up.  She was here for surveillance colonoscopy on 01/28/2022.  She is here alone today.  She is recently returned from a trip to [place].  Colitis was found on colonoscopy and after procedure she developed flare symptoms which were more predominant for her.  Lialda was continued and she was treated with a prednisone taper 40 mg x 1 week decreasing by 5 mg every 7 days.  Bentyl was used for crampy abdominal pain.  Colonoscopy: 01/28/2022: Mayo 2 moderate pancolitis from sigmoid to cecum most significant in the right colon and transverse colon. Path: Chronic active colitis without dysplasia  She reports that she has continue Lialda 4.8 g/day.  Recall that she had stopped Lialda after having previously been on 2.4 g/day due to hair loss.  This was several months prior to her symptom onset and colonoscopy.  She has weaned prednisone and has 1 week left of 5 mg.  She continues Lialda 4.8 g daily.  Bowel habits have significantly improved.  She is having typically 1 soft bowel movement per day.  No blood or mucus.  No abdominal pain.  She lost nearly 15 pounds during her recent flare and has gained back a little bit of this weight today.  Her trip to [place] was great for her she was there about 3 weeks.  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  Her dad has since passed away and her mother has moved back to [place].  She continues Xarelto but notes even with the brief hold prior to colonoscopy she had a tight and painful spot in her left leg which has resolved.  IBD: Previous severe nausea and vomiting with 6-MP  Review of Systems As per HPI, otherwise negative  Current Medications, Allergies, Past  Medical History, Past Surgical History, Family History and Social History were reviewed in [employer] record.    Objective:   Physical Exam BP 92/60   Pulse 87   Ht 5' 4"  (1.626 m)   Wt 113 lb (51.3 kg)   BMI 19.40 kg/m   Gen: awake, alert, NAD HEENT: anicteric CV: RRR, no mrg Pulm: CTA b/l Abd: soft, NT/ND, +BS throughout Ext: no c/c/e Neuro: nonfocal   Fecal cal 02/08/22: 1210  Quantiferon gold negative     Latest Ref Rng & Units 02/05/2022   10:24 AM 06/22/2021    3:09 PM 06/12/2020    3:10 PM  CBC  WBC 4.0 - 10.5 K/uL 9.1 A Manual Differential was performed and is consistent with the Automated Differential.  3.4  7.4   Hemoglobin 12.0 - 15.0 g/dL 14.3  13.2  14.5   Hematocrit 36.0 - 46.0 % 42.3  39.1  42.4   Platelets 150.0 - 400.0 K/uL 228.0  153.0  218.0    Viral hepatitis negative    Assessment & Plan:  51 year old female with a history of pan ulcerative colitis (diagnosis 2003), history of DVT and PE on Xarelto, rheumatoid arthritis who is here for follow-up.  Longstanding pan ulcerative colitis with recent flare --she had been off Lialda due to hair loss and she has noticed some hair thinning even with resuming Lialda.  With Lialda  and prednisone taper symptoms have come under control clinically.  We discussed the need to escalate therapy based on colonoscopic findings and recent flare.  We reviewed medical options and after discussion of the risk, benefits and alternatives we have decided on Entyvio. -- Entyvio start at the [facility]; standard induction followed by 300 mg every 8 weeks -- Continue Lialda 4.8 g/day with plans to hopefully reduce to 2.4 g daily and then stop completely given issues with hair loss.  She will likely need Entyvio at least 3 months before we stop 5-ASA therapy. -- Complete prednisone taper and then stop steroids -- Surveillance colonoscopy at the 2-year mark -- Fecal calprotectin in 4 to 6 months after  Entyvio to ensure improving inflammation  2.  History of DVT PE --continuing Xarelto  30 minutes total spent today including patient facing time, coordination of care, reviewing medical history/procedures/pertinent radiology studies, and documentation of the encounter.  22-monthfollow-up with me

## 2022-03-18 ENCOUNTER — Telehealth: Payer: Self-pay | Admitting: Pharmacy Technician

## 2022-03-18 NOTE — Telephone Encounter (Signed)
Auth Submission: approved/denied: APPROVED Payer: [employer] Medication & CPT/J Code(s) submitted: Entyvio (Vedolizumab) [id number] Route of submission (phone, fax, portal): Portal Auth type: Buy/Bill Units/visits requested: 343m x 4 visits Reference number: [id number] from: 03/18/22 [facility]/24/[facility]

## 2022-03-23 ENCOUNTER — Ambulatory Visit (INDEPENDENT_AMBULATORY_CARE_PROVIDER_SITE_OTHER): Payer: 59

## 2022-03-23 VITALS — BP 98/60 | HR 82 | Temp 98.8°F | Resp 16 | Ht 67.0 in | Wt 118.0 lb

## 2022-03-23 DIAGNOSIS — K5100 Ulcerative (chronic) pancolitis without complications: Secondary | ICD-10-CM | POA: Diagnosis not present

## 2022-03-23 MED ORDER — VEDOLIZUMAB 300 MG IV SOLR
300.0000 mg | Freq: Once | INTRAVENOUS | Status: AC
  Administered 2022-03-23: 300 mg via INTRAVENOUS
  Filled 2022-03-23: qty 5

## 2022-03-23 NOTE — Progress Notes (Signed)
Diagnosis: Ulcerative Pancolitis  Provider:  Marshell Garfinkel MD  Procedure: Infusion  IV Type: Peripheral, IV Location: R Antecubital  Entyvio (Vedolizumab), Dose: 300 mg  Infusion Start Time: 4840  Infusion Stop Time: 3979  Post Infusion IV Care: Observation period completed and Peripheral IV Discontinued  Discharge: Condition: Good, Destination: Home . AVS provided to patient.   Performed by:  Arnoldo Morale, RN

## 2022-03-23 NOTE — Patient Instructions (Signed)
Vedolizumab Injection What is this medication? VEDOLIZUMAB (ve doe LIZ you mab) treats inflammatory bowel diseases. It works by decreasing inflammation in the digestive tract. This medicine may be used for other purposes; ask your health care provider or pharmacist if you have questions. COMMON BRAND NAME(S): Entyvio What should I tell my care team before I take this medication? They need to know if you have any of these conditions: Immune system problems Infection, such a tuberculosis (TB) or other bacterial, fungal, or viral infections Liver disease Recent or upcoming vaccine An unusual or allergic reaction to vedolizumab, other medications, foods, dyes, or preservatives Pregnant or trying to get pregnant Breast-feeding How should I use this medication? This medication is injected into a vein. It is given by your care team in a hospital or clinic setting. A special MedGuide will be given to you by the pharmacist with each prescription and refill. Be sure to read this information carefully each time. Talk to your care team about the use of this medication in children. It is not approved for use in children. Overdosage: If you think you have taken too much of this medicine contact a poison control center or emergency room at once. NOTE: This medicine is only for you. Do not share this medicine with others. What if I miss a dose? It is important not to miss your dose. Call your care team if you are unable to keep an appointment. What may interact with this medication? Steroid medications, such as prednisone or cortisone TNF-alpha inhibitors, such as natalizumab, adalimumab, and infliximab Vaccines This list may not describe all possible interactions. Give your health care provider a list of all the medicines, herbs, non-prescription drugs, or dietary supplements you use. Also tell them if you smoke, drink alcohol, or use illegal drugs. Some items may interact with your medicine. What should  I watch for while using this medication? Visit your care team for regular checks on your progress. Tell your care team if your symptoms do not start to get better or if they get worse. Stay away from people who are sick. Call your care team for advice if you get a fever, chills or sore throat, or other symptoms of a cold or flu. Do not treat yourself. In some patients, this medication may cause a serious brain infection that may cause death. If you have any problems seeing, thinking, speaking, walking, or standing, tell your care team right away. If you cannot reach your care team, get urgent medical care. What side effects may I notice from receiving this medication? Side effects that you should report to your care team as soon as possible: Allergic reactions--skin rash, itching, hives, swelling of the face, lips, tongue, or throat Dizziness, loss of balance or coordination, confusion or trouble speaking Infection--fever, chills, cough, or sore throat Infusion reactions--chest pain, shortness of breath or trouble breathing, feeling faint or lightheaded Liver injury--right upper belly pain, loss of appetite, nausea, light-colored stool, dark yellow or brown urine, yellowing skin or eyes, unusual weakness or fatigue Side effects that usually do not require medical attention (report to your care team if they continue or are bothersome): Headache Fatigue Joint pain Nausea This list may not describe all possible side effects. Call your doctor for medical advice about side effects. You may report side effects to FDA at 1-800-FDA-1088. Where should I keep my medication? This medication is given in a hospital or clinic and will not be stored at home. NOTE: This sheet is a summary. It  may not cover all possible information. If you have questions about this medicine, talk to your doctor, pharmacist, or health care provider.  2023 Elsevier/Gold Standard (2012-12-20 00:00:00)

## 2022-03-26 ENCOUNTER — Ambulatory Visit (HOSPITAL_BASED_OUTPATIENT_CLINIC_OR_DEPARTMENT_OTHER)
Admission: RE | Admit: 2022-03-26 | Discharge: 2022-03-26 | Disposition: A | Discharge status: Home / Self Care | Payer: 59 | Source: Ambulatory Visit | Attending: Family Medicine | Admitting: Family Medicine

## 2022-03-26 DIAGNOSIS — Z1231 Encounter for screening mammogram for malignant neoplasm of breast: Secondary | ICD-10-CM | POA: Diagnosis present

## 2022-03-31 ENCOUNTER — Telehealth: Payer: Self-pay | Admitting: Pharmacy Technician

## 2022-03-31 NOTE — Telephone Encounter (Signed)
[facility] co-pay card: approved  Id: [id number] Bin: [id number] Pcn: [id number] Gr: [id number] FAX EOB/CLAIM: [phone]

## 2022-04-06 ENCOUNTER — Ambulatory Visit (INDEPENDENT_AMBULATORY_CARE_PROVIDER_SITE_OTHER): Payer: 59

## 2022-04-06 VITALS — BP 104/68 | HR 77 | Temp 97.8°F | Resp 18 | Ht 64.0 in | Wt 114.8 lb

## 2022-04-06 DIAGNOSIS — K5100 Ulcerative (chronic) pancolitis without complications: Secondary | ICD-10-CM | POA: Diagnosis not present

## 2022-04-06 MED ORDER — VEDOLIZUMAB 300 MG IV SOLR
300.0000 mg | Freq: Once | INTRAVENOUS | Status: AC
  Administered 2022-04-06: 300 mg via INTRAVENOUS
  Filled 2022-04-06: qty 5

## 2022-04-06 NOTE — Progress Notes (Signed)
Diagnosis: Ulcerative Pancolitis  Provider:  Marshell Garfinkel MD  Procedure: Infusion  IV Type: Peripheral, IV Location: R Antecubital  Entyvio (Vedolizumab), Dose: 300 mg  Infusion Start Time: 5956  Infusion Stop Time: 3875  Post Infusion IV Care: Peripheral IV Discontinued  Discharge: Condition: Good, Destination: Home . AVS provided to patient.   Performed by:  Arnoldo Morale, RN

## 2022-05-04 ENCOUNTER — Ambulatory Visit (INDEPENDENT_AMBULATORY_CARE_PROVIDER_SITE_OTHER): Payer: 59

## 2022-05-04 VITALS — BP 99/64 | HR 71 | Temp 98.4°F | Resp 16 | Ht 64.0 in | Wt 115.4 lb

## 2022-05-04 DIAGNOSIS — K5100 Ulcerative (chronic) pancolitis without complications: Secondary | ICD-10-CM

## 2022-05-04 MED ORDER — VEDOLIZUMAB 300 MG IV SOLR
300.0000 mg | Freq: Once | INTRAVENOUS | Status: AC
  Administered 2022-05-04: 300 mg via INTRAVENOUS
  Filled 2022-05-04: qty 5

## 2022-05-04 NOTE — Progress Notes (Signed)
Diagnosis: Crohn's Disease  Provider:  Marshell Garfinkel MD  Procedure: Infusion  IV Type: Peripheral, IV Location: R Antecubital  Entyvio (Vedolizumab), Dose: 300 mg  Infusion Start Time: 0092  Infusion Stop Time: 3300  Post Infusion IV Care: Peripheral IV Discontinued  Discharge: Condition: Good, Destination: Home . AVS provided to patient.   Performed by:  Adelina Mings, LPN

## 2022-05-17 ENCOUNTER — Other Ambulatory Visit: Payer: Self-pay | Admitting: Internal Medicine

## 2022-05-27 ENCOUNTER — Telehealth: Payer: Self-pay | Admitting: Pharmacy Technician

## 2022-05-27 ENCOUNTER — Other Ambulatory Visit: Payer: Self-pay | Admitting: Pharmacy Technician

## 2022-05-27 NOTE — Telephone Encounter (Signed)
Auth Submission: [place]PROVED [place] RENEW[place] Payer: [employer] Medication & CPT/J Code(s) submitted: Entyvio (Vedolizumab) [id number] Route of submission (phone, fax, portal): PORTAL Phone # Fax # Auth type: Buy/Bill Units/visits requested: 300MG Q8WKS Reference number: [id number] Approval from: 06/02/22 to 06/03/23

## 2022-06-24 NOTE — Patient Instructions (Addendum)
It was great to see you again today- I will be in touch with your labs Recommend the latest covid booster if not done already  Flu shot today Have a wonderful holiday season!

## 2022-06-24 NOTE — Progress Notes (Signed)
[facility] [address] [place], [place] [address] [phone]  Date:  06/28/2022   Name:  Annette Hopkins   DOB:  1971/02/01   MRN:  [id number]  PCP:  Darreld Mclean, MD    Chief Complaint: No chief complaint on file.   History of Present Illness:  Annette Hopkins is a 51 y.o. very pleasant female patient who presents with the following:  Pt seen today for CPE Last seen by myself about one year ago  Patient with history of (questionable) rheumatoid arthritis, coagulation defect, history of DVT and PE on Xarelto, ulcerative colitis, melanoma, vit d def.  She also had a severe case of shingles in 2019-she still has some numbness on her lip from this illness   Flu shot- give today  Covid booster Mammo 8/23 Pap UTD Colon UTD   Xarelto Trazodone- this helps her sleep  Lialda Mirena - placed in 2018, advised pt good for 8 years.  Will check [place] today  Entivio every 8 weeks   She is getting plenty of exercise - enjoys hiking, cardio, weights Patient Active Problem List   Diagnosis Date Noted   Ulcerative colitis ([place]) 03/16/2022   Basal cell carcinoma (BCC) in situ of skin 05/21/2020   Shingles 07/04/2018   History of inflammatory arthritis 06/27/2018   Melanoma ([place]) 02/20/2013   Headache(784.0) 04/20/2012   Osteopenia 02/21/2012   GERD (gastroesophageal reflux disease) 02/17/2011   Coagulation defect ([place]) 02/16/2011   Transaminitis 11/11/2010   Ulcerative colitis    History of blood clots    RA (rheumatoid arthritis) ([place])    Polycystic ovaries 07/31/2010    Past Medical History:  Diagnosis Date   Abnormal LFTs    Clotting disorder (HCC)    Esophagitis    High risk medication use    History of blood clots     B leg DVT 2005, abdomen. On Coumadin for life   History of blood transfusion    after reaction to 6-MP   Long term (current) use of anticoagulants    Pulmonary embolism (HCC)    RA  (rheumatoid arthritis) ([place])    ~2005   Ulcerative colitis    dx  around 2004    Past Surgical History:  Procedure Laterality Date   vascular stents bilateral groins  2006    Social History   Tobacco Use   Smoking status: Never   Smokeless tobacco: Never   Tobacco comments:    Never Used Tobacco  Vaping Use   Vaping Use: Never used  Substance Use Topics   Alcohol use: Yes    Alcohol/week: 7.0 standard drinks of alcohol    Types: 7 Glasses of wine per week   Drug use: No    Family History  Problem Relation Age of Onset   Colon cancer Paternal Grandmother    Colon polyps Paternal Grandmother    Breast cancer Mother    Deep vein thrombosis Father        between lung and heart   CAD Neg Hx    Clotting disorder Neg Hx    Stomach cancer Neg Hx    Esophageal cancer Neg Hx     Allergies  Allergen Reactions   Mercaptopurine Nausea And Vomiting   Sulfa Antibiotics Hives    Medication list has been reviewed and updated.  Current Outpatient Medications on File Prior to Visit  Medication Sig Dispense Refill   levonorgestrel (  MIRENA) 20 MCG/24HR IUD 1 each by Intrauterine route once.     LIALDA 1.2 g EC tablet TAKE 4 TABLETS BY MOUTH DAILY  WITH BREAKFAST 360 tablet 3   traZODone (DESYREL) 50 MG tablet TAKE 1/2 TO 1 TABLET BY MOUTH AT BEDTIME AS NEEDED FOR SLEEP .  MAY TAKE UP TO 2 TABLETS IF  NEEDED 180 tablet 3   Vedolizumab (ENTYVIO IV) Inject into the vein every 8 (eight) weeks.     XARELTO 20 MG TABS tablet TAKE 1 TABLET BY MOUTH  DAILY WITH DINNER 90 tablet 3   No current facility-administered medications on file prior to visit.    Review of Systems:  As per HPI- otherwise negative.   Physical Examination: Vitals:   06/28/22 1442  BP: 108/78  Pulse: 76  Resp: 12  Temp: 97.8 F (36.6 C)  SpO2: 99%   Vitals:   06/28/22 1442  Weight: 119 lb 9.6 oz (54.3 kg)  Height: 5' 4"  (1.626 m)   Body mass index is 20.53 kg/m. Ideal Body Weight: Weight in (lb)  to have BMI = 25: 145.3  GEN: no acute distress. HEENT: Atraumatic, Normocephalic.  Ears and Nose: No external deformity. CV: RRR, No M/G/R. No JVD. No thrill. No extra heart sounds. PULM: CTA B, no wheezes, crackles, rhonchi. No retractions. No resp. distress. No accessory muscle use. ABD: S, NT, ND, +BS. No rebound. No HSM. EXTR: No c/c/e PSYCH: Normally interactive. Conversant.    Assessment and Plan: Physical exam  Hyponatremia - Plan: Basic metabolic panel  Screening, lipid - Plan: Lipid panel  Thyroid disorder screening - Plan: TSH  Vitamin D deficiency - Plan: VITAMIN D 25 Hydroxy (Vit-D Deficiency, Fractures)  Amenorrhea - Plan: [place]  Screening for diabetes mellitus - Plan: Hemoglobin A1c  Physical exam today- encouraged healthy diet and exercise routine  Will plan further follow- up pending labs. She does not have menstrual bleeding with her mirena- would like an [place] to see if she might be in menopause Flu shot today    Signed Lamar Blinks, MD  Addendum 11/28, received labs as below.  Message to patient  Results for orders placed or performed in visit on 06/28/22  [facility]  Result Value Ref Range   FSH 4.8 mIU/ML  Basic metabolic panel  Result Value Ref Range   Sodium 138 135 - 145 mEq/L   Potassium 4.3 3.5 - 5.1 mEq/L   Chloride 101 96 - 112 mEq/L   CO2 30 19 - 32 mEq/L   Glucose, Bld 91 70 - 99 mg/dL   BUN 11 6 - 23 mg/dL   Creatinine, Ser 0.62 0.40 - 1.20 mg/dL   GFR 104.30 >60.00 mL/min   Calcium 9.0 8.4 - 10.5 mg/dL  TSH  Result Value Ref Range   TSH 0.42 0.35 - 5.50 uIU/mL  Hemoglobin A1c  Result Value Ref Range   Hgb A1c MFr Bld 5.5 4.6 - 6.5 %  Lipid panel  Result Value Ref Range   Cholesterol 185 0 - 200 mg/dL   Triglycerides 136.0 0.0 - 149.0 mg/dL   HDL 74.40 >39.00 mg/dL   VLDL 27.2 0.0 - 40.0 mg/dL   LDL Cholesterol 84 0 - 99 mg/dL   Total CHOL/HDL Ratio 2    NonHDL 110.88   VITAMIN D 25 Hydroxy (Vit-D Deficiency, Fractures)   Result Value Ref Range   VITD 39.41 30.00 - 100.00 ng/mL

## 2022-06-28 ENCOUNTER — Encounter: Payer: Self-pay | Admitting: Family Medicine

## 2022-06-28 ENCOUNTER — Ambulatory Visit (INDEPENDENT_AMBULATORY_CARE_PROVIDER_SITE_OTHER): Payer: 59 | Admitting: Family Medicine

## 2022-06-28 VITALS — BP 108/78 | HR 76 | Temp 97.8°F | Resp 12 | Ht 64.0 in | Wt 119.6 lb

## 2022-06-28 DIAGNOSIS — N912 Amenorrhea, unspecified: Secondary | ICD-10-CM | POA: Diagnosis not present

## 2022-06-28 DIAGNOSIS — E559 Vitamin D deficiency, unspecified: Secondary | ICD-10-CM

## 2022-06-28 DIAGNOSIS — Z131 Encounter for screening for diabetes mellitus: Secondary | ICD-10-CM

## 2022-06-28 DIAGNOSIS — Z1329 Encounter for screening for other suspected endocrine disorder: Secondary | ICD-10-CM | POA: Diagnosis not present

## 2022-06-28 DIAGNOSIS — E871 Hypo-osmolality and hyponatremia: Secondary | ICD-10-CM | POA: Diagnosis not present

## 2022-06-28 DIAGNOSIS — Z0000 Encounter for general adult medical examination without abnormal findings: Secondary | ICD-10-CM

## 2022-06-28 DIAGNOSIS — Z23 Encounter for immunization: Secondary | ICD-10-CM

## 2022-06-28 DIAGNOSIS — Z13220 Encounter for screening for lipoid disorders: Secondary | ICD-10-CM

## 2022-06-28 NOTE — Addendum Note (Signed)
Addended by: Kem Boroughs D on: 06/28/2022 03:14 PM   Modules accepted: Orders

## 2022-06-29 ENCOUNTER — Ambulatory Visit (INDEPENDENT_AMBULATORY_CARE_PROVIDER_SITE_OTHER): Payer: 59

## 2022-06-29 ENCOUNTER — Encounter: Payer: Self-pay | Admitting: Family Medicine

## 2022-06-29 VITALS — BP 96/66 | HR 73 | Temp 98.8°F | Resp 18 | Ht 64.0 in | Wt 120.0 lb

## 2022-06-29 DIAGNOSIS — K5100 Ulcerative (chronic) pancolitis without complications: Secondary | ICD-10-CM | POA: Diagnosis not present

## 2022-06-29 LAB — BASIC METABOLIC PANEL
BUN: 11 mg/dL (ref 6–23)
CO2: 30 mEq/L (ref 19–32)
Calcium: 9 mg/dL (ref 8.4–10.5)
Chloride: 101 mEq/L (ref 96–112)
Creatinine, Ser: 0.62 mg/dL (ref 0.40–1.20)
GFR: 104.3 mL/min (ref >60.00)
Glucose, Bld: 91 mg/dL (ref 70–99)
Potassium: 4.3 mEq/L (ref 3.5–5.1)
Sodium: 138 mEq/L (ref 135–145)

## 2022-06-29 LAB — LIPID PANEL
Cholesterol: 185 mg/dL (ref 0–200)
HDL: 74.4 mg/dL (ref >39.00)
LDL Cholesterol: 84 mg/dL (ref 0–99)
NonHDL: 110.88
Total CHOL/HDL Ratio: 2
Triglycerides: 136 mg/dL (ref 0.0–149.0)
VLDL: 27.2 mg/dL (ref 0.0–40.0)

## 2022-06-29 LAB — FOLLICLE STIMULATING HORMONE: FSH: 4.8 m[IU]/mL

## 2022-06-29 LAB — VITAMIN D 25 HYDROXY (VIT D DEFICIENCY, FRACTURES): VITD: 39.41 ng/mL (ref 30.00–100.00)

## 2022-06-29 LAB — HEMOGLOBIN A1C: Hgb A1c MFr Bld: 5.5 % (ref 4.6–6.5)

## 2022-06-29 LAB — TSH: TSH: 0.42 u[IU]/mL (ref 0.35–5.50)

## 2022-06-29 MED ORDER — VEDOLIZUMAB 300 MG IV SOLR
300.0000 mg | Freq: Once | INTRAVENOUS | Status: AC
  Administered 2022-06-29: 300 mg via INTRAVENOUS
  Filled 2022-06-29: qty 5

## 2022-06-29 NOTE — Progress Notes (Signed)
Diagnosis: Ulcerative Colitis  Provider:  Marshell Garfinkel MD  Procedure: Infusion  IV Type: Peripheral, IV Location: R Antecubital  Entyvio (Vedolizumab), Dose: 300 mg  Infusion Start Time: 5830  Infusion Stop Time: 1610  Post Infusion IV Care: Peripheral IV Discontinued  Discharge: Condition: Good, Destination: Home . AVS provided to patient.   Performed by:  Arnoldo Morale, RN

## 2022-08-24 ENCOUNTER — Ambulatory Visit (INDEPENDENT_AMBULATORY_CARE_PROVIDER_SITE_OTHER): Payer: 59

## 2022-08-24 VITALS — BP 97/62 | HR 71 | Temp 98.2°F | Resp 18 | Ht 64.0 in | Wt 119.6 lb

## 2022-08-24 DIAGNOSIS — K5100 Ulcerative (chronic) pancolitis without complications: Secondary | ICD-10-CM

## 2022-08-24 MED ORDER — VEDOLIZUMAB 300 MG IV SOLR
300.0000 mg | Freq: Once | INTRAVENOUS | Status: AC
  Administered 2022-08-24: 300 mg via INTRAVENOUS
  Filled 2022-08-24: qty 5

## 2022-08-24 NOTE — Progress Notes (Signed)
Diagnosis: Ulcerative Colitis  Provider:  Marshell Garfinkel MD  Procedure: Infusion  IV Type: Peripheral, IV Location: R Antecubital  Entyvio (Vedolizumab), Dose: 300 mg  Infusion Start Time: 0786  Infusion Stop Time: 7544  Post Infusion IV Care: Peripheral IV Discontinued  Discharge: Condition: Good, Destination: Home . AVS provided to patient.   Performed by:  Arnoldo Morale, RN

## 2022-10-19 ENCOUNTER — Ambulatory Visit (INDEPENDENT_AMBULATORY_CARE_PROVIDER_SITE_OTHER): Payer: 59 | Admitting: *Deleted

## 2022-10-19 VITALS — BP 102/65 | HR 59 | Temp 98.3°F | Resp 18 | Ht 64.0 in | Wt 126.6 lb

## 2022-10-19 DIAGNOSIS — K5100 Ulcerative (chronic) pancolitis without complications: Secondary | ICD-10-CM | POA: Diagnosis not present

## 2022-10-19 MED ORDER — VEDOLIZUMAB 300 MG IV SOLR
300.0000 mg | Freq: Once | INTRAVENOUS | Status: AC
  Administered 2022-10-19: 300 mg via INTRAVENOUS
  Filled 2022-10-19: qty 5

## 2022-10-19 NOTE — Progress Notes (Signed)
Diagnosis: Crohn's Disease  Provider:  Marshell Garfinkel MD  Procedure: Infusion  IV Type: Peripheral, IV Location: R Antecubital  Entyvio (Vedolizumab), Dose: 300 mg  Infusion Start Time: 1556 pm  Infusion Stop Time: 1630 pm  Post Infusion IV Care: Observation period completed and Peripheral IV Discontinued  Discharge: Condition: Good, Destination: Home . AVS Provided  Performed by:  Oren Beckmann, RN

## 2022-11-16 ENCOUNTER — Other Ambulatory Visit: Payer: Self-pay | Admitting: Family Medicine

## 2022-11-16 DIAGNOSIS — D689 Coagulation defect, unspecified: Secondary | ICD-10-CM

## 2022-12-07 ENCOUNTER — Ambulatory Visit (INDEPENDENT_AMBULATORY_CARE_PROVIDER_SITE_OTHER): Payer: 59

## 2022-12-07 VITALS — BP 100/61 | HR 66 | Temp 98.5°F | Resp 16 | Ht 64.0 in | Wt 126.2 lb

## 2022-12-07 DIAGNOSIS — K5100 Ulcerative (chronic) pancolitis without complications: Secondary | ICD-10-CM | POA: Diagnosis not present

## 2022-12-07 MED ORDER — VEDOLIZUMAB 300 MG IV SOLR
300.0000 mg | Freq: Once | INTRAVENOUS | Status: AC
  Administered 2022-12-07: 300 mg via INTRAVENOUS
  Filled 2022-12-07: qty 5

## 2022-12-07 NOTE — Progress Notes (Signed)
Diagnosis: Ulcerative pancolitits without complication      Provider:  Chilton Greathouse MD  Procedure: IV Infusion  IV Type: Peripheral, IV Location: R Antecubital  Entyvio (Vedolizumab), Dose: 300 mg  Infusion Start Time: 1539  Infusion Stop Time: 1610  Post Infusion IV Care: Peripheral IV Discontinued  Discharge: Condition: Good, Destination: Home . AVS Declined  Performed by:  Marlow Baars [place], RN

## 2023-02-01 ENCOUNTER — Ambulatory Visit (INDEPENDENT_AMBULATORY_CARE_PROVIDER_SITE_OTHER): Payer: 59

## 2023-02-01 VITALS — BP 94/60 | HR 77 | Temp 98.2°F | Resp 16 | Ht 64.0 in | Wt 128.0 lb

## 2023-02-01 DIAGNOSIS — K5100 Ulcerative (chronic) pancolitis without complications: Secondary | ICD-10-CM | POA: Diagnosis not present

## 2023-02-01 MED ORDER — VEDOLIZUMAB 300 MG IV SOLR
300.0000 mg | Freq: Once | INTRAVENOUS | Status: AC
  Administered 2023-02-01: 300 mg via INTRAVENOUS
  Filled 2023-02-01: qty 5

## 2023-02-01 NOTE — Progress Notes (Signed)
Diagnosis: Ulcerative Colitis  Provider:  Chilton Greathouse MD  Procedure: IV Infusion  IV Type: Peripheral, IV Location: R Antecubital  Entyvio (Vedolizumab), Dose: 300 mg  Infusion Start Time: 1507  Infusion Stop Time: 1539  Post Infusion IV Care: Peripheral IV Discontinued  Discharge: Condition: Good, Destination: Home . AVS Declined  Performed by:  Loney Hering, LPN

## 2023-02-21 ENCOUNTER — Encounter: Payer: Self-pay | Admitting: Internal Medicine

## 2023-03-29 ENCOUNTER — Ambulatory Visit (INDEPENDENT_AMBULATORY_CARE_PROVIDER_SITE_OTHER): Payer: 59

## 2023-03-29 VITALS — BP 103/66 | HR 64 | Temp 98.3°F | Resp 14 | Ht 64.0 in | Wt 129.4 lb

## 2023-03-29 DIAGNOSIS — K5100 Ulcerative (chronic) pancolitis without complications: Secondary | ICD-10-CM

## 2023-03-29 MED ORDER — VEDOLIZUMAB 300 MG IV SOLR
300.0000 mg | Freq: Once | INTRAVENOUS | Status: AC
  Administered 2023-03-29: 300 mg via INTRAVENOUS
  Filled 2023-03-29: qty 5

## 2023-03-29 NOTE — Progress Notes (Signed)
Diagnosis: Ulcerative Colitis  Provider:  Chilton Greathouse MD  Procedure: IV Infusion  IV Type: Peripheral, IV Location: R Antecubital  Entyvio (Vedolizumab), Dose: 300 mg  Infusion Start Time: 1535  Infusion Stop Time: 1608  Post Infusion IV Care: Peripheral IV Discontinued  Discharge: Condition: Good, Destination: Home . AVS Declined  Performed by:  Nat Math, RN

## 2023-04-26 ENCOUNTER — Other Ambulatory Visit: Payer: Self-pay | Admitting: Family Medicine

## 2023-04-26 DIAGNOSIS — D689 Coagulation defect, unspecified: Secondary | ICD-10-CM

## 2023-05-19 ENCOUNTER — Inpatient Hospital Stay (HOSPITAL_BASED_OUTPATIENT_CLINIC_OR_DEPARTMENT_OTHER): Admission: RE | Admit: 2023-05-19 | Payer: 59 | Source: Ambulatory Visit | Admitting: Radiology

## 2023-05-19 DIAGNOSIS — Z1231 Encounter for screening mammogram for malignant neoplasm of breast: Secondary | ICD-10-CM

## 2023-05-24 ENCOUNTER — Ambulatory Visit: Payer: 59

## 2023-05-31 ENCOUNTER — Ambulatory Visit (INDEPENDENT_AMBULATORY_CARE_PROVIDER_SITE_OTHER): Payer: 59

## 2023-05-31 ENCOUNTER — Other Ambulatory Visit (HOSPITAL_BASED_OUTPATIENT_CLINIC_OR_DEPARTMENT_OTHER): Payer: Self-pay | Admitting: Family Medicine

## 2023-05-31 VITALS — BP 98/65 | HR 65 | Temp 97.8°F | Resp 14 | Ht 64.0 in | Wt 131.4 lb

## 2023-05-31 DIAGNOSIS — K5100 Ulcerative (chronic) pancolitis without complications: Secondary | ICD-10-CM

## 2023-05-31 DIAGNOSIS — Z1231 Encounter for screening mammogram for malignant neoplasm of breast: Secondary | ICD-10-CM

## 2023-05-31 MED ORDER — VEDOLIZUMAB 300 MG IV SOLR
300.0000 mg | Freq: Once | INTRAVENOUS | Status: AC
  Administered 2023-05-31: 300 mg via INTRAVENOUS
  Filled 2023-05-31: qty 5

## 2023-05-31 NOTE — Progress Notes (Signed)
Diagnosis: Ulcerative Colitis  Provider:  Chilton Greathouse MD  Procedure: IV Infusion  IV Type: Peripheral, IV Location: R Antecubital  Entyvio (Vedolizumab), Dose: 300 mg  Infusion Start Time: 1540  Infusion Stop Time: 1615  Post Infusion IV Care: Patient declined observation and Peripheral IV Discontinued  Discharge: Condition: Stable, Destination: Home . AVS Declined  Performed by:  Wyvonne Lenz, RN

## 2023-06-05 ENCOUNTER — Telehealth: Payer: 59 | Admitting: Physician Assistant

## 2023-06-05 DIAGNOSIS — N390 Urinary tract infection, site not specified: Secondary | ICD-10-CM | POA: Diagnosis not present

## 2023-06-05 MED ORDER — NITROFURANTOIN MONOHYD MACRO 100 MG PO CAPS: 100.0000 mg | ORAL_CAPSULE | Freq: Two times a day (BID) | ORAL | 0 refills | Status: AC

## 2023-06-05 NOTE — Patient Instructions (Signed)
  Shaaron Adler, thank you for joining Tylene Fantasia Ward, PA-C for today's virtual visit.  While this provider is not your primary care provider (PCP), if your PCP is located in our provider database this encounter information will be shared with them immediately following your visit.   A [facility] MyChart account gives you access to today's visit and all your visits, tests, and labs performed at [facility] " click here if you don't have a [facility] MyChart account or go to [email or web address]  Consent: (Patient) Annette Hopkins provided verbal consent for this virtual visit at the beginning of the encounter.  Current Medications:  Current Outpatient Medications:    nitrofurantoin, macrocrystal-monohydrate, (MACROBID) 100 MG capsule, Take 1 capsule (100 mg total) by mouth 2 (two) times daily for 5 days., Disp: 10 capsule, Rfl: 0   levonorgestrel (MIRENA) 20 MCG/24HR IUD, 1 each by Intrauterine route once., Disp: , Rfl:    LIALDA 1.2 g EC tablet, TAKE 4 TABLETS BY MOUTH DAILY  WITH BREAKFAST, Disp: 360 tablet, Rfl: 3   traZODone (DESYREL) 50 MG tablet, TAKE 1/2 TO 1 TABLET BY MOUTH AT BEDTIME AS NEEDED FOR SLEEP .  MAY TAKE UP TO 2 TABLETS IF  NEEDED, Disp: 180 tablet, Rfl: 3   Vedolizumab (ENTYVIO IV), Inject into the vein every 8 (eight) weeks., Disp: , Rfl:    XARELTO 20 MG TABS tablet, TAKE 1 TABLET BY MOUTH DAILY  WITH SUPPER, Disp: 90 tablet, Rfl: 3   Medications ordered in this encounter:  Meds ordered this encounter  Medications   nitrofurantoin, macrocrystal-monohydrate, (MACROBID) 100 MG capsule    Sig: Take 1 capsule (100 mg total) by mouth 2 (two) times daily for 5 days.    Dispense:  10 capsule    Refill:  0    Order Specific Question:   Supervising Provider    Answer:   Merrilee Jansky [id number]     *If you need refills on other medications prior to your next appointment, please contact your pharmacy*  Follow-Up: Call back or seek an in-person  evaluation if the symptoms worsen or if the condition fails to improve as anticipated.  [facility] Virtual [facility] [phone]  Other Instructions Take antibiotic as prescribed.  Drink plenty of fluids. If you symptoms are not improving please follow up for an in person visit with your primary care physician or Urgent care.    If you have been instructed to have an in-person evaluation today at a local Urgent Care facility, please use the link below. It will take you to a list of all of our available [facility], including address, phone number and hours of operation. Please do not delay care.  [facility]  If you or a family member do not have a primary care provider, use the link below to schedule a visit and establish care. When you choose a [facility] primary care physician or advanced practice provider, you gain a long-term partner in health. Find a Primary Care Provider  Learn more about [facility]'s in-office and virtual care options: [facility] - Get Care Now

## 2023-06-05 NOTE — Progress Notes (Signed)
Virtual Visit Consent   Annette Hopkins, you are scheduled for a virtual visit with a [facility] provider today. Just as with appointments in the office, your consent must be obtained to participate. Your consent will be active for this visit and any virtual visit you may have with one of our providers in the next 365 days. If you have a MyChart account, a copy of this consent can be sent to you electronically.  As this is a virtual visit, video technology does not allow for your provider to perform a traditional examination. This may limit your provider's ability to fully assess your condition. If your provider identifies any concerns that need to be evaluated in person or the need to arrange testing (such as labs, EKG, etc.), we will make arrangements to do so. Although advances in technology are sophisticated, we cannot ensure that it will always work on either your end or our end. If the connection with a video visit is poor, the visit may have to be switched to a telephone visit. With either a video or telephone visit, we are not always able to ensure that we have a secure connection.  By engaging in this virtual visit, you consent to the provision of healthcare and authorize for your insurance to be billed (if applicable) for the services provided during this visit. Depending on your insurance coverage, you may receive a charge related to this service.  I need to obtain your verbal consent now. Are you willing to proceed with your visit today? PEGEEN STIGER has provided verbal consent on 06/05/2023 for a virtual visit (video or telephone). Tylene Fantasia Ward, PA-C  Date: 06/05/2023 8:59 AM  Virtual Visit via Video Note   I, Tylene Fantasia Ward, connected with  Annette Hopkins  ([id number], 20-Feb-1971) on 06/05/23 at  9:00 AM EST by a video-enabled telemedicine application and verified that I am speaking with the correct person using two identifiers.  Location: Patient: Virtual Visit Location Patient:  Home Provider: Virtual Visit Location Provider: Home Office   I discussed the limitations of evaluation and management by telemedicine and the availability of in person appointments. The patient expressed understanding and agreed to proceed.    History of Present Illness: Annette Hopkins is a 52 y.o. who identifies as a female who was assigned female at birth, and is being seen today for uti symptoms that started last night.  Reports over night she began experiencing increased urinary frequency and feeling like she cannot fully empty her bladder.  She reports mild lower back pain.  Denies dysuria, abdominal pain, fever.  Reports today's symptoms feel similar to previous UTIs.  HPI: HPI  Problems:  Patient Active Problem List   Diagnosis Date Noted   Ulcerative colitis (HCC) 03/16/2022   Basal cell carcinoma (BCC) in situ of skin 05/21/2020   Shingles 07/04/2018   History of inflammatory arthritis 06/27/2018   Melanoma (HCC) 02/20/2013   Headache 04/20/2012   Osteopenia 02/21/2012   GERD (gastroesophageal reflux disease) 02/17/2011   Coagulation defect (HCC) 02/16/2011   Transaminitis 11/11/2010   Ulcerative colitis (HCC)    History of blood clots    RA (rheumatoid arthritis) (HCC)    Polycystic ovaries 07/31/2010    Allergies:  Allergies  Allergen Reactions   Mercaptopurine Nausea And Vomiting   Sulfa Antibiotics Hives   Medications:  Current Outpatient Medications:    nitrofurantoin, macrocrystal-monohydrate, (MACROBID) 100 MG capsule, Take 1 capsule (100 mg total) by mouth 2 (two) times  daily for 5 days., Disp: 10 capsule, Rfl: 0   levonorgestrel (MIRENA) 20 MCG/24HR IUD, 1 each by Intrauterine route once., Disp: , Rfl:    LIALDA 1.2 g EC tablet, TAKE 4 TABLETS BY MOUTH DAILY  WITH BREAKFAST, Disp: 360 tablet, Rfl: 3   traZODone (DESYREL) 50 MG tablet, TAKE 1/2 TO 1 TABLET BY MOUTH AT BEDTIME AS NEEDED FOR SLEEP .  MAY TAKE UP TO 2 TABLETS IF  NEEDED, Disp: 180 tablet, Rfl: 3    Vedolizumab (ENTYVIO IV), Inject into the vein every 8 (eight) weeks., Disp: , Rfl:    XARELTO 20 MG TABS tablet, TAKE 1 TABLET BY MOUTH DAILY  WITH SUPPER, Disp: 90 tablet, Rfl: 3  Observations/Objective: Patient is well-developed, well-nourished in no acute distress.  Resting comfortably at home.  Head is normocephalic, atraumatic.  No labored breathing.  Speech is clear and coherent with logical content.  Patient is alert and oriented at baseline.    Assessment and Plan: 1. Urinary tract infection without hematuria, site unspecified  In person evaluation precautions discussed.   Follow Up Instructions: I discussed the assessment and treatment plan with the patient. The patient was provided an opportunity to ask questions and all were answered. The patient agreed with the plan and demonstrated an understanding of the instructions.  A copy of instructions were sent to the patient via MyChart unless otherwise noted below.     The patient was advised to call back or seek an in-person evaluation if the symptoms worsen or if the condition fails to improve as anticipated.    Tylene Fantasia Ward, PA-C

## 2023-06-17 ENCOUNTER — Other Ambulatory Visit: Payer: Self-pay | Admitting: Pharmacy Technician

## 2023-07-01 NOTE — Progress Notes (Unsigned)
[facility] [address], [address] [place], [place] [address] [phone]  Date:  07/04/2023   Name:  Annette Hopkins   DOB:  1970-10-29   MRN:  [id number]  PCP:  Pearline Cables, MD    Chief Complaint: No chief complaint on file.   History of Present Illness:  Annette Hopkins is a 52 y.o. very pleasant female patient who presents with the following:  Patient seen today for physical exam Most recent visit with myself was about 1 year ago  Patient with history of (questionable) rheumatoid arthritis, coagulation defect, history of DVT and PE on Xarelto, ulcerative colitis, melanoma, vit d def.  She also had a severe case of shingles in 2019-she still has some numbness on her lip from this illness   She enjoys regular exercise  Tetanus booster Flu shot COVID booster Shingrix Mammogram is scheduled Colonoscopy due next year Pap 11/21, can update today if she would like  Due for blood work today Patient Active Problem List   Diagnosis Date Noted   Ulcerative colitis (HCC) 03/16/2022   Basal cell carcinoma (BCC) in situ of skin 05/21/2020   Shingles 07/04/2018   History of inflammatory arthritis 06/27/2018   Melanoma (HCC) 02/20/2013   Headache 04/20/2012   Osteopenia 02/21/2012   GERD (gastroesophageal reflux disease) 02/17/2011   Coagulation defect (HCC) 02/16/2011   Transaminitis 11/11/2010   Ulcerative colitis (HCC)    History of blood clots    RA (rheumatoid arthritis) (HCC)    Polycystic ovaries 07/31/2010    Past Medical History:  Diagnosis Date   Abnormal LFTs    Clotting disorder (HCC)    Esophagitis    High risk medication use    History of blood clots     B leg DVT 2005, abdomen. On Coumadin for life   History of blood transfusion    after reaction to 6-MP   Long term (current) use of anticoagulants    Pulmonary embolism (HCC)    RA (rheumatoid arthritis) (HCC)    ~2005   Ulcerative colitis     dx  around 2004    Past Surgical History:  Procedure Laterality Date   vascular stents bilateral groins  2006    Social History   Tobacco Use   Smoking status: Never   Smokeless tobacco: Never   Tobacco comments:    Never Used Tobacco  Vaping Use   Vaping status: Never Used  Substance Use Topics   Alcohol use: Yes    Alcohol/week: 7.0 standard drinks of alcohol    Types: 7 Glasses of wine per week   Drug use: No    Family History  Problem Relation Age of Onset   Colon cancer Paternal Grandmother    Colon polyps Paternal Grandmother    Breast cancer Mother    Deep vein thrombosis Father        between lung and heart   CAD Neg Hx    Clotting disorder Neg Hx    Stomach cancer Neg Hx    Esophageal cancer Neg Hx     Allergies  Allergen Reactions   Mercaptopurine Nausea And Vomiting   Sulfa Antibiotics Hives    Medication list has been reviewed and updated.  Current Outpatient Medications on File Prior to Visit  Medication Sig Dispense Refill   levonorgestrel (MIRENA) 20 MCG/24HR IUD 1 each by Intrauterine route once.     LIALDA 1.2 g EC tablet  TAKE 4 TABLETS BY MOUTH DAILY  WITH BREAKFAST 360 tablet 3   traZODone (DESYREL) 50 MG tablet TAKE 1/2 TO 1 TABLET BY MOUTH AT BEDTIME AS NEEDED FOR SLEEP .  MAY TAKE UP TO 2 TABLETS IF  NEEDED 180 tablet 3   Vedolizumab (ENTYVIO IV) Inject into the vein every 8 (eight) weeks.     XARELTO 20 MG TABS tablet TAKE 1 TABLET BY MOUTH DAILY  WITH SUPPER 90 tablet 3   No current facility-administered medications on file prior to visit.    Review of Systems:  As per HPI- otherwise negative.   Physical Examination: There were no vitals filed for this visit. There were no vitals filed for this visit. There is no height or weight on file to calculate BMI. Ideal Body Weight:    GEN: no acute distress. HEENT: Atraumatic, Normocephalic.  Ears and Nose: No external deformity. CV: RRR, No M/G/R. No JVD. No thrill. No extra heart  sounds. PULM: CTA B, no wheezes, crackles, rhonchi. No retractions. No resp. distress. No accessory muscle use. ABD: S, NT, ND, +BS. No rebound. No HSM. EXTR: No c/c/e PSYCH: Normally interactive. Conversant.    Assessment and Plan: *** Physical exam today.  Encouraged healthy diet and exercise routine Will plan further follow- up pending labs.  Signed Abbe Amsterdam, MD

## 2023-07-01 NOTE — Patient Instructions (Incomplete)
It was great to see you again today, I will be in touch with your blood work Flu shot, tetanus booster given today  Recommend COVID booster at your convenience, shingles vaccine series at your convenience  Referral made to gynecology to discuss having your IUD replaced

## 2023-07-04 ENCOUNTER — Ambulatory Visit (INDEPENDENT_AMBULATORY_CARE_PROVIDER_SITE_OTHER): Payer: 59 | Admitting: Family Medicine

## 2023-07-04 ENCOUNTER — Other Ambulatory Visit (HOSPITAL_COMMUNITY)
Admission: RE | Admit: 2023-07-04 | Discharge: 2023-07-04 | Disposition: A | Discharge status: Home / Self Care | Payer: 59 | Source: Ambulatory Visit | Attending: Family Medicine | Admitting: Family Medicine

## 2023-07-04 VITALS — BP 122/80 | HR 69 | Temp 97.9°F | Resp 18 | Ht 64.0 in | Wt 130.2 lb

## 2023-07-04 DIAGNOSIS — Z0000 Encounter for general adult medical examination without abnormal findings: Secondary | ICD-10-CM

## 2023-07-04 DIAGNOSIS — Z1329 Encounter for screening for other suspected endocrine disorder: Secondary | ICD-10-CM | POA: Diagnosis not present

## 2023-07-04 DIAGNOSIS — Z23 Encounter for immunization: Secondary | ICD-10-CM | POA: Diagnosis not present

## 2023-07-04 DIAGNOSIS — Z130 Encounter for screening for diseases of the blood and blood-forming organs and certain disorders involving the immune mechanism: Secondary | ICD-10-CM | POA: Diagnosis not present

## 2023-07-04 DIAGNOSIS — F5102 Adjustment insomnia: Secondary | ICD-10-CM

## 2023-07-04 DIAGNOSIS — Z124 Encounter for screening for malignant neoplasm of cervix: Secondary | ICD-10-CM | POA: Diagnosis present

## 2023-07-04 DIAGNOSIS — Z131 Encounter for screening for diabetes mellitus: Secondary | ICD-10-CM | POA: Diagnosis not present

## 2023-07-04 DIAGNOSIS — N951 Menopausal and female climacteric states: Secondary | ICD-10-CM | POA: Diagnosis not present

## 2023-07-04 DIAGNOSIS — E559 Vitamin D deficiency, unspecified: Secondary | ICD-10-CM | POA: Diagnosis not present

## 2023-07-04 DIAGNOSIS — Z13220 Encounter for screening for lipoid disorders: Secondary | ICD-10-CM

## 2023-07-04 MED ORDER — TRAZODONE HCL 50 MG PO TABS: 50.0000 mg | ORAL_TABLET | Freq: Every evening | ORAL | 3 refills | Status: AC | PRN

## 2023-07-05 ENCOUNTER — Encounter (HOSPITAL_BASED_OUTPATIENT_CLINIC_OR_DEPARTMENT_OTHER): Payer: Self-pay | Admitting: Radiology

## 2023-07-05 ENCOUNTER — Telehealth: Payer: Self-pay | Admitting: Pharmacy Technician

## 2023-07-05 ENCOUNTER — Ambulatory Visit (HOSPITAL_BASED_OUTPATIENT_CLINIC_OR_DEPARTMENT_OTHER)
Admission: RE | Admit: 2023-07-05 | Discharge: 2023-07-05 | Disposition: A | Discharge status: Home / Self Care | Payer: 59 | Source: Ambulatory Visit | Attending: Family Medicine | Admitting: Family Medicine

## 2023-07-05 DIAGNOSIS — Z1231 Encounter for screening mammogram for malignant neoplasm of breast: Secondary | ICD-10-CM | POA: Diagnosis present

## 2023-07-05 LAB — LIPID PANEL
Cholesterol: 187 mg/dL (ref 0–200)
HDL: 55.3 mg/dL (ref >39.00)
LDL Cholesterol: 106 mg/dL — ABNORMAL HIGH (ref 0–99)
NonHDL: 131.39
Total CHOL/HDL Ratio: 3
Triglycerides: 129 mg/dL (ref 0.0–149.0)
VLDL: 25.8 mg/dL (ref 0.0–40.0)

## 2023-07-05 LAB — COMPREHENSIVE METABOLIC PANEL
ALT: 16 U/L (ref 0–35)
AST: 18 U/L (ref 0–37)
Albumin: 4.1 g/dL (ref 3.5–5.2)
Alkaline Phosphatase: 64 U/L (ref 39–117)
BUN: 11 mg/dL (ref 6–23)
CO2: 31 meq/L (ref 19–32)
Calcium: 9.3 mg/dL (ref 8.4–10.5)
Chloride: 102 meq/L (ref 96–112)
Creatinine, Ser: 0.69 mg/dL (ref 0.40–1.20)
GFR: 100.92 mL/min (ref >60.00)
Glucose, Bld: 77 mg/dL (ref 70–99)
Potassium: 4.1 meq/L (ref 3.5–5.1)
Sodium: 138 meq/L (ref 135–145)
Total Bilirubin: 0.4 mg/dL (ref 0.2–1.2)
Total Protein: 7 g/dL (ref 6.0–8.3)

## 2023-07-05 LAB — CBC
HCT: 40.5 % (ref 36.0–46.0)
Hemoglobin: 13.4 g/dL (ref 12.0–15.0)
MCHC: 33.2 g/dL (ref 30.0–36.0)
MCV: 89.7 fL (ref 78.0–100.0)
Platelets: 287 10*3/uL (ref 150.0–400.0)
RBC: 4.52 Mil/uL (ref 3.87–5.11)
RDW: 12.7 % (ref 11.5–15.5)
WBC: 6.1 10*3/uL (ref 4.0–10.5)

## 2023-07-05 LAB — TSH: TSH: 1.47 u[IU]/mL (ref 0.35–5.50)

## 2023-07-05 LAB — FOLLICLE STIMULATING HORMONE: FSH: 3.7 m[IU]/mL

## 2023-07-05 LAB — HEMOGLOBIN A1C: Hgb A1c MFr Bld: 5.6 % (ref 4.6–6.5)

## 2023-07-05 LAB — VITAMIN D 25 HYDROXY (VIT D DEFICIENCY, FRACTURES): VITD: 19.95 ng/mL — ABNORMAL LOW (ref 30.00–100.00)

## 2023-07-05 NOTE — Telephone Encounter (Signed)
PA RENEWAL  Auth Submission: APPROVED Site of care: Site of care: [facility] Payer: [employer] Medication & CPT/J Code(s) submitted: Entyvio (Vedolizumab) [id number] Route of submission (phone, fax, portal): PORTAL Phone # Fax # Auth type: Buy/Bill PB Units/visits requested:300MG  Q8WKS X7 DOSES Reference number: [id number] Approval from: 07/05/23 to 07/04/24

## 2023-07-06 ENCOUNTER — Encounter: Payer: Self-pay | Admitting: Family Medicine

## 2023-07-06 LAB — CYTOLOGY - PAP
Adequacy: ABSENT
Comment: NEGATIVE
Diagnosis: NEGATIVE
High risk HPV: NEGATIVE

## 2023-07-06 MED ORDER — VITAMIN D3 1.25 MG (50000 UT) PO CAPS: ORAL_CAPSULE | ORAL | 0 refills | Status: AC

## 2023-07-06 NOTE — Addendum Note (Signed)
Addended by: Abbe Amsterdam C on: 07/06/2023 12:52 PM   Modules accepted: Orders

## 2023-08-04 ENCOUNTER — Ambulatory Visit: Payer: 59 | Admitting: *Deleted

## 2023-08-04 VITALS — BP 102/66 | HR 68 | Temp 98.0°F | Resp 16 | Ht 64.0 in | Wt 130.0 lb

## 2023-08-04 DIAGNOSIS — K5100 Ulcerative (chronic) pancolitis without complications: Secondary | ICD-10-CM | POA: Diagnosis not present

## 2023-08-04 MED ORDER — SODIUM CHLORIDE 0.9 % IV SOLN
300.0000 mg | Freq: Once | INTRAVENOUS | Status: AC
  Administered 2023-08-04: 300 mg via INTRAVENOUS
  Filled 2023-08-04: qty 5

## 2023-08-04 NOTE — Progress Notes (Signed)
 Diagnosis: Ulcerative Colitis  Provider:  Mannam, Praveen MD  Procedure: IV Infusion  IV Type: Peripheral, IV Location: R Antecubital  Entyvio  (Vedolizumab ), Dose: 300 mg  Infusion Start Time: 1428 pm  Infusion Stop Time: 1502 pm  Post Infusion IV Care: Observation period completed and Peripheral IV Discontinued  Discharge: Condition: Good, Destination: Home . AVS Provided  Performed by:  Trudy Lamarr LABOR, RN

## 2023-08-30 ENCOUNTER — Telehealth: Payer: Self-pay

## 2023-08-30 NOTE — Telephone Encounter (Signed)
Called patient to schedule new patient appointment. Left voicemail with our contact information to call back and schedule.

## 2023-09-06 ENCOUNTER — Telehealth: Payer: Self-pay

## 2023-09-06 NOTE — Telephone Encounter (Signed)
Returned patients call to schedule new patient appointment. Left voicemail for her to call back.

## 2023-09-29 ENCOUNTER — Ambulatory Visit: Payer: 59

## 2023-10-11 ENCOUNTER — Ambulatory Visit (INDEPENDENT_AMBULATORY_CARE_PROVIDER_SITE_OTHER): Payer: 59

## 2023-10-11 VITALS — BP 106/67 | HR 83 | Temp 97.7°F | Resp 18 | Ht 64.0 in | Wt 125.4 lb

## 2023-10-11 DIAGNOSIS — K5100 Ulcerative (chronic) pancolitis without complications: Secondary | ICD-10-CM

## 2023-10-11 MED ORDER — VEDOLIZUMAB 300 MG IV SOLR
300.0000 mg | Freq: Once | INTRAVENOUS | Status: AC
  Administered 2023-10-11: 300 mg via INTRAVENOUS
  Filled 2023-10-11: qty 5

## 2023-10-11 NOTE — Progress Notes (Signed)
 Diagnosis: Ulcerative Colitis  Provider:  Chilton Greathouse MD  Procedure: IV Infusion  IV Type: Peripheral, IV Location: R AntecubitalEntyvio  ,Entyvio  Dose: 300 mg  Infusion Start Time: 1608  Infusion Stop Time: 1647  Post Infusion IV Care: Patient declined observation and Peripheral IV Discontinued  Discharge: Condition: Good, Destination: Home . AVS Declined  Performed by:  Forrest Moron, RN

## 2023-10-18 ENCOUNTER — Ambulatory Visit: Payer: 59 | Admitting: Obstetrics and Gynecology

## 2023-10-18 ENCOUNTER — Encounter: Payer: Self-pay | Admitting: Obstetrics and Gynecology

## 2023-10-18 VITALS — BP 102/63 | HR 76 | Wt 126.0 lb

## 2023-10-18 DIAGNOSIS — N958 Other specified menopausal and perimenopausal disorders: Secondary | ICD-10-CM

## 2023-10-18 DIAGNOSIS — Z3009 Encounter for other general counseling and advice on contraception: Secondary | ICD-10-CM

## 2023-10-18 DIAGNOSIS — Z7689 Persons encountering health services in other specified circumstances: Secondary | ICD-10-CM

## 2023-10-18 DIAGNOSIS — Z975 Presence of (intrauterine) contraceptive device: Secondary | ICD-10-CM | POA: Diagnosis not present

## 2023-10-18 NOTE — Progress Notes (Signed)
   GYNECOLOGY PROGRESS NOTE  History:  53 y.o. G1P0101 presents to [facility] for perimenopause. Has an IUD in place, Mirena 03/16/2017. Has a history of blood clots and PE Had blood work recently as well as pap smear with PCP. Denies vaginal or pelvic complaints, Denies hot flashes, mood disturbance.   The following portions of the patient's history were reviewed and updated as appropriate: allergies, current medications, past family history, past medical history, past social history, past surgical history and problem list. Last pap smear on 07/04/23 was normal, neg HRHPV.  Health Maintenance Due  Topic Date Due   Zoster Vaccines- Shingrix (1 of 2) Never done   COVID-19 Vaccine (4 - 2024-25 season) 04/03/2023   Colonoscopy  01/29/2024     Review of Systems:  Pertinent items are noted in HPI.   Objective:  Physical Exam Blood pressure 102/63, pulse 76, weight 126 lb (57.2 kg). VS reviewed, nursing note reviewed,  Constitutional: well developed, well nourished, no distress HEENT: normocephalic Pulm/chest wall: normal effort Abdomen: soft Neuro: alert and oriented  Skin: warm, dry Psych: affect normal Pelvic exam: deferred  Assessment & Plan:  1. Encounter to establish care (Primary) 2. IUD (intrauterine device) in place Reviewed recent labs, [facility] 3.7 Discussed removal/replacement in 2026 Can reassess need for IUD replacement at that time Had significant pain with previous IUD insertion, reports blacking out Discussed options for insertion including premedicating, spray/block for insertion. Discussed removal sooner if experiencing symptoms UTD with pap and mammogram   Albertine Grates, FNP

## 2023-12-06 ENCOUNTER — Ambulatory Visit

## 2023-12-13 ENCOUNTER — Ambulatory Visit (INDEPENDENT_AMBULATORY_CARE_PROVIDER_SITE_OTHER)

## 2023-12-13 VITALS — BP 98/63 | HR 63 | Temp 98.1°F | Resp 16 | Ht 64.0 in | Wt 127.6 lb

## 2023-12-13 DIAGNOSIS — K5100 Ulcerative (chronic) pancolitis without complications: Secondary | ICD-10-CM

## 2023-12-13 MED ORDER — VEDOLIZUMAB 300 MG IV SOLR
300.0000 mg | Freq: Once | INTRAVENOUS | Status: AC
  Administered 2023-12-13: 300 mg via INTRAVENOUS
  Filled 2023-12-13: qty 5

## 2023-12-13 NOTE — Progress Notes (Signed)
 Diagnosis: Ulcerative Colitis  Provider:  Praveen Mannam MD  Procedure: IV Infusion  IV Type: Peripheral, IV Location: R Antecubital  Entyvio  (Vedolizumab ), Dose: 300 mg  Infusion Start Time: 1555  Infusion Stop Time: 1625  Post Infusion IV Care: Peripheral IV Discontinued  Discharge: Condition: Good, Destination: Home . AVS Declined  Performed by:  Malorie Bigford, RN

## 2024-01-18 ENCOUNTER — Telehealth: Admitting: Physician Assistant

## 2024-01-18 DIAGNOSIS — W57XXXA Bitten or stung by nonvenomous insect and other nonvenomous arthropods, initial encounter: Secondary | ICD-10-CM | POA: Diagnosis not present

## 2024-01-18 DIAGNOSIS — Z9189 Other specified personal risk factors, not elsewhere classified: Secondary | ICD-10-CM

## 2024-01-18 MED ORDER — DOXYCYCLINE HYCLATE 100 MG PO TABS: 200.0000 mg | ORAL_TABLET | Freq: Once | ORAL | 0 refills | Status: AC

## 2024-01-18 NOTE — Progress Notes (Signed)
E-Visit for Tick Bite  Thank you for describing your tick bite, Here is how we plan to help! Based on the information that you shared with me it looks like you have A tick that bite that we will treat with a short course of doxycycline.  In most cases a tick bite is painless and does not itch.  Most tick bites in which the tick is quickly removed do not require prescriptions. Ticks can transmit several diseases if they are infected and remain attacked to your skin. Therefore the length that the tick was attached and any symptoms you have experienced after the bite are import to accurately develop your custom treatment plan. In most cases a single dose of doxycycline may prevent the development of a more serious condition.  Based on your information I have Provided a home care guide for tick bites and  instructions on when to call for help. and I have sent a single dose of doxycycline to the pharmacy you selected. Please make sure that you selected a pharmacy that is open now.  Which ticks  are associated with illness?  The Wood Tick (dog tick) is the size of a watermelon seed and can sometimes transmit Rocky Mountain spotted fever and [place] tick fever.   The Deer Tick (black-legged tick) is between the size of a poppy seed (pin head) and an apple seed, and can sometimes transmit Lyme disease.  A brown to black tick with a white splotch on its back is likely a female Amblyomma americanum (Lone Star tick). This tick has been associated with Southern Tick Associated illness ( STARI)  Lyme disease has become the most common tick-borne illness in the [place]. The risk of Lyme disease following a recognized deer tick bite is estimated to be 1%.  The majority of cases of Lyme disease start with a bull's eye rash at the site of the tick bite. The rash can occur days to weeks (typically 7-10 days) after a tick bite. Treatment with antibiotics is indicated if this rash appears. Flu-like symptoms  may accompany the rash, including: fever, chills, headaches, muscle aches, and fatigue. Removing ticks promptly may prevent tick borne disease.  What can be used to prevent Tick Bites?  Insect repellant with at leas 20% DEET. Wearing long pants with sock and shoes. Avoiding tall grass and heavily wooded areas. Checking your skin after being outdoors. Shower with a washcloth after outdoor exposures.  HOME CARE ADVICE FOR TICK BITE  Wood Tick Removal:  Use a pair of tweezers and grasp the wood tick close to the skin (on its head). Pull the wood tick straight upward without twisting or crushing it. Maintain a steady pressure until it releases its grip.   If tweezers aren't available, use fingers, a loop of thread around the jaws, or a needle between the jaws for traction.  Note: covering the tick with petroleum jelly, nail polish or rubbing alcohol doesn't work. Neither does touching the tick with a hot or cold object. Tiny Deer Tick Removal:   Needs to be scraped off with a knife blade or credit card edge. Place tick in a sealed container (e.g. glass jar, zip lock plastic bag), in case your doctor wants to see it. Tick's Head Removal:  If the wood tick's head breaks off in the skin, it must be removed. Clean the skin. Then use a sterile needle to uncover the head and lift it out or scrape it off.  If a very small piece   of the head remains, the skin will eventually slough it off. Antibiotic Ointment:  Wash the wound and your hands with soap and water after removal to prevent catching any tick disease.  Apply an over the counter antibiotic ointment (e.g. bacitracin) to the bite once. Expected Course: Tick bites normally don't itch or hurt. That's why they often go unnoticed. Call Your Doctor If:  You can't remove the tick or the tick's head Fever, a severe head ache, or rash occur in the next 2 weeks Bite begins to look infected Lyme's disease is common in your area You have not had a  tetanus in the last 10 years Your current symptoms become worse    MAKE SURE YOU  Understand these instructions. Will watch your condition. Will get help right away if you are not doing well or get worse.    Thank you for choosing an e-visit.  Your e-visit answers were reviewed by a board certified advanced clinical practitioner to complete your personal care plan. Depending upon the condition, your plan could have included both over the counter or prescription medications.  Please review your pharmacy choice. Make sure the pharmacy is open so you can pick up prescription now. If there is a problem, you may contact your provider through MyChart messaging and have the prescription routed to another pharmacy.  Your safety is important to us. If you have drug allergies check your prescription carefully.   For the next 24 hours you can use MyChart to ask questions about today's visit, request a non-urgent call back, or ask for a work or school excuse. You will get an email in the next two days asking about your experience. I hope that your e-visit has been valuable and will speed your recovery.  

## 2024-01-18 NOTE — Progress Notes (Signed)
 I have spent 5 minutes in review of e-visit questionnaire, review and updating patient chart, medical decision making and response to patient.   Piedad Climes, PA-C

## 2024-02-07 ENCOUNTER — Ambulatory Visit

## 2024-02-07 VITALS — BP 109/74 | HR 68 | Temp 97.7°F | Resp 20 | Ht 64.0 in | Wt 125.2 lb

## 2024-02-07 DIAGNOSIS — K5100 Ulcerative (chronic) pancolitis without complications: Secondary | ICD-10-CM | POA: Diagnosis not present

## 2024-02-07 MED ORDER — VEDOLIZUMAB 300 MG IV SOLR
300.0000 mg | Freq: Once | INTRAVENOUS | Status: AC
  Administered 2024-02-07: 300 mg via INTRAVENOUS
  Filled 2024-02-07: qty 5

## 2024-02-07 NOTE — Progress Notes (Cosign Needed Addendum)
 Diagnosis: Ulcerative pancolitis without complication   Provider:  Lonna Coder MD  Procedure: IV Infusion  IV Type: Peripheral, IV Location: R Antecubital  Entyvio  (Vedolizumab ), Dose: 300 mg  Infusion Start Time: 1549  Infusion Stop Time: 1628  Post Infusion IV Care: Peripheral IV Discontinued  Discharge: Condition: Good, Destination: Home . AVS Declined  Performed by:  Maximiano JONELLE Pouch, LPN

## 2024-03-14 ENCOUNTER — Other Ambulatory Visit: Payer: Self-pay | Admitting: Family Medicine

## 2024-03-14 DIAGNOSIS — D689 Coagulation defect, unspecified: Secondary | ICD-10-CM

## 2024-04-05 ENCOUNTER — Ambulatory Visit: Admitting: *Deleted

## 2024-04-05 VITALS — BP 108/68 | HR 74 | Temp 97.5°F | Resp 18 | Ht 64.0 in | Wt 130.2 lb

## 2024-04-05 DIAGNOSIS — K5100 Ulcerative (chronic) pancolitis without complications: Secondary | ICD-10-CM

## 2024-04-05 MED ORDER — VEDOLIZUMAB 300 MG IV SOLR
300.0000 mg | Freq: Once | INTRAVENOUS | Status: AC
  Administered 2024-04-05: 300 mg via INTRAVENOUS
  Filled 2024-04-05: qty 5

## 2024-04-05 NOTE — Progress Notes (Signed)
 Diagnosis: Ulcerative Colitis  Provider:  Mannam, Praveen MD  Procedure: IV Infusion  IV Type: Peripheral, IV Location: L Antecubital  Entyvio  (Vedolizumab ), Dose: 300 mg  Infusion Start Time: 1544 pm  Infusion Stop Time: 1620 pm  Post Infusion IV Care: Observation period completed and Peripheral IV Discontinued  Discharge: Condition: Good, Destination: Home . AVS Provided  Performed by:  Trudy Lamarr LABOR, RN

## 2024-05-08 ENCOUNTER — Encounter: Payer: Self-pay | Admitting: Family Medicine

## 2024-05-23 ENCOUNTER — Encounter: Payer: Self-pay | Admitting: Internal Medicine

## 2024-05-28 ENCOUNTER — Encounter: Payer: Self-pay | Admitting: Family Medicine

## 2024-05-28 NOTE — Progress Notes (Addendum)
 [facility] [address], [address] [place], [place] [address] [phone]  Date:  05/30/2024   Name:  Annette Hopkins   DOB:  1970/08/23   MRN:  [id number]  PCP:  Watt Harlene BROCKS, MD    Chief Complaint: Oral Pain (Tongue pain onset several weeks ago and just wont go away )   History of Present Illness:  Annette Hopkins is a 53 y.o. very pleasant female patient who presents with the following:  Pt seen today with a concern about her tongue Last seen by me in December for a CPE  Patient with history of (questionable) rheumatoid arthritis, coagulation defect, history of DVT and PE- on Xarelto , ulcerative colitis, melanoma, vit d def She sent me a message earlier this week with concern about tongue pain  Discussed the use of AI scribe software for clinical note transcription with the patient, who gave verbal consent to proceed.  History of Present Illness Annette Hopkins is a 53 year old female who presents with tongue pain and burning sensation.  She has been experiencing tongue pain and a burning sensation for several weeks to months, with symptoms progressively worsening. The discomfort is primarily located at the anterior part of the tongue, which is often red, swollen, and peeling. Certain foods, particularly those that are acidic, as well as hot or cold temperatures, exacerbate the symptoms.  She also has persistent mouth sores. No difficulty swallowing, and the symptoms do not affect her throat.  She has been on Entyvio  for over a year and also takes Xarelto , an IUD, and trazodone . She has not tried any over-the-counter treatments for her symptoms but suspects her toothpaste may be contributing to the irritation.  She has seen her dentist during this period, but the dentist did not express concern about her symptoms at the time of the visit. She has a history of vitamin D  deficiency.    Patient Active Problem List    Diagnosis Date Noted   Ulcerative colitis (HCC) 03/16/2022   Basal cell carcinoma (BCC) in situ of skin 05/21/2020   Shingles 07/04/2018   History of inflammatory arthritis 06/27/2018   Melanoma (HCC) 02/20/2013   Headache 04/20/2012   Osteopenia 02/21/2012   GERD (gastroesophageal reflux disease) 02/17/2011   Coagulation defect 02/16/2011   Transaminitis 11/11/2010   History of blood clots    RA (rheumatoid arthritis) (HCC)    Polycystic ovaries 07/31/2010    Past Medical History:  Diagnosis Date   Abnormal LFTs    Clotting disorder    Esophagitis    High risk medication use    History of blood clots     B leg DVT 2005, abdomen. On Coumadin  for life   History of blood transfusion    after reaction to 6-MP   Long term (current) use of anticoagulants    Pulmonary embolism (HCC)    RA (rheumatoid arthritis) (HCC)    ~2005   Ulcerative colitis    dx  around 2004    Past Surgical History:  Procedure Laterality Date   vascular stents bilateral groins  2006    Social History   Tobacco Use   Smoking status: Never   Smokeless tobacco: Never   Tobacco comments:    Never Used Tobacco  Vaping Use   Vaping status: Never Used  Substance Use Topics   Alcohol use: Yes    Alcohol/week: 7.0 standard drinks of alcohol    Types:  7 Glasses of wine per week   Drug use: No    Family History  Problem Relation Age of Onset   Colon cancer Paternal Grandmother    Colon polyps Paternal Grandmother    Breast cancer Mother    Deep vein thrombosis Father        between lung and heart   CAD Neg Hx    Clotting disorder Neg Hx    Stomach cancer Neg Hx    Esophageal cancer Neg Hx     Allergies  Allergen Reactions   Mercaptopurine  Nausea And Vomiting   Sulfa Antibiotics Hives    Medication list has been reviewed and updated.  Current Outpatient Medications on File Prior to Visit  Medication Sig Dispense Refill   Cholecalciferol (VITAMIN D3) 1.25 MG (50000 UT) CAPS Take 1  weekly for 12 weeks 12 capsule 0   levonorgestrel  (MIRENA ) 20 MCG/24HR IUD 1 each by Intrauterine route once.     traZODone  (DESYREL ) 50 MG tablet Take 1 tablet (50 mg total) by mouth at bedtime as needed for sleep. 90 tablet 3   Vedolizumab  (ENTYVIO  IV) Inject into the vein every 8 (eight) weeks.     XARELTO  20 MG TABS tablet TAKE 1 TABLET BY MOUTH DAILY  WITH SUPPER 90 tablet 3   No current facility-administered medications on file prior to visit.    Review of Systems:  As per HPI- otherwise negative.  BP Readings from Last 3 Encounters:  05/30/24 98/64  04/05/24 108/68  02/07/24 109/74     Physical Examination: Vitals:   05/30/24 1018  BP: 98/64  Pulse: 75  SpO2: 100%   Vitals:   05/30/24 1018  Weight: 132 lb (59.9 kg)  Height: 5' 4 (1.626 m)   Body mass index is 22.66 kg/m. Ideal Body Weight: Weight in (lb) to have BMI = 25: 145.3  GEN: no acute distress. Looks well slim build HEENT: Atraumatic, Normocephalic.  Bilateral TM wnl, oropharynx normal except for tongue.  PEERL,EOMI.    Well demarcated area of tongue change and irritation as pictured below  Ears and Nose: No external deformity. CV: RRR, No M/G/R. No JVD. No thrill. No extra heart sounds. PULM: CTA B, no wheezes, crackles, rhonchi. No retractions. No resp. distress. No accessory muscle use. EXTR: No c/c/e PSYCH: Normally interactive. Conversant.    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 Differential includes vitamin deficiencies,  ?related to her IBD - Order blood tests for iron and B12 levels. - Prescribe Benadryl and Mylanta mouthwash for symptom relief and healing.  General Health Maintenance Emphasized flu vaccine importance for routine health maintenance. - Administer flu shot.  Signed Harlene Schroeder, MD  Received labs, message to pt  Results for orders placed or performed in visit on 05/30/24  CBC   Collection Time: 05/30/24 10:50 AM  Result Value Ref Range   WBC 6.8 4.0 - 10.5 K/uL   RBC 4.38 3.87 - 5.11 Mil/uL   Platelets 211.0 150.0 - 400.0 K/uL   Hemoglobin 13.1 12.0 - 15.0 g/dL   HCT 60.9 63.9 - 53.9 %  MCV 89.1 78.0 - 100.0 fl   MCHC 33.7 30.0 - 36.0 g/dL   RDW 86.5 88.4 - 84.4 %  Comprehensive metabolic panel with GFR   Collection Time: 05/30/24 10:50 AM  Result Value Ref Range   Sodium 135 135 - 145 mEq/L   Potassium 4.5 3.5 - 5.1 mEq/L   Chloride 101 96 - 112 mEq/L   CO2 30 19 - 32 mEq/L   Glucose, Bld 85 70 - 99 mg/dL   BUN 12 6 - 23 mg/dL   Creatinine, Ser 9.25 0.40 - 1.20 mg/dL   Total Bilirubin 0.3 0.2 - 1.2 mg/dL   Alkaline Phosphatase 95 39 - 117 U/L   AST 23 0 - 37 U/L   ALT 27 0 - 35 U/L   Total Protein 6.9 6.0 - 8.3 g/dL   Albumin 4.0 3.5 - 5.2 g/dL   GFR 06.50 >39.99 mL/min   Calcium 9.5 8.4 - 10.5 mg/dL  Hemoglobin J8r   Collection Time: 05/30/24 10:50 AM  Result Value Ref Range   Hgb A1c MFr Bld 5.5 4.6 - 6.5 %  Lipid panel   Collection Time: 05/30/24 10:50 AM  Result Value Ref Range   Cholesterol 177 0 - 200 mg/dL   Triglycerides 850.9 0.0 - 149.0 mg/dL   HDL 29.99 >60.99 mg/dL   VLDL 70.1 0.0 - 59.9 mg/dL   LDL Cholesterol 78 0 - 99 mg/dL   Total CHOL/HDL Ratio 3    NonHDL 107.36   TSH   Collection Time: 05/30/24 10:50 AM  Result Value Ref Range   TSH 1.28 0.35 - 5.50 uIU/mL  VITAMIN D  25 Hydroxy (Vit-D Deficiency, Fractures)   Collection Time: 05/30/24 10:50 AM  Result Value Ref Range   VITD 24.58 (L) 30.00 - 100.00 ng/mL  Vitamin B12    Collection Time: 05/30/24 10:50 AM  Result Value Ref Range   Vitamin B-12 360 211 - 911 pg/mL  Ferritin   Collection Time: 05/30/24 10:50 AM  Result Value Ref Range   Ferritin 10.7 10.0 - 291.0 ng/mL  Folate   Collection Time: 05/30/24 10:50 AM  Result Value Ref Range   Folate >23.7 >5.9 ng/mL

## 2024-05-30 ENCOUNTER — Encounter: Payer: Self-pay | Admitting: Family Medicine

## 2024-05-30 ENCOUNTER — Other Ambulatory Visit (HOSPITAL_BASED_OUTPATIENT_CLINIC_OR_DEPARTMENT_OTHER): Payer: Self-pay

## 2024-05-30 ENCOUNTER — Encounter: Payer: Self-pay | Admitting: Internal Medicine

## 2024-05-30 ENCOUNTER — Ambulatory Visit: Admitting: Family Medicine

## 2024-05-30 VITALS — BP 98/64 | HR 75 | Ht 64.0 in | Wt 132.0 lb

## 2024-05-30 DIAGNOSIS — Z23 Encounter for immunization: Secondary | ICD-10-CM | POA: Diagnosis not present

## 2024-05-30 DIAGNOSIS — K146 Glossodynia: Secondary | ICD-10-CM | POA: Diagnosis not present

## 2024-05-30 DIAGNOSIS — Z1329 Encounter for screening for other suspected endocrine disorder: Secondary | ICD-10-CM

## 2024-05-30 DIAGNOSIS — Z131 Encounter for screening for diabetes mellitus: Secondary | ICD-10-CM

## 2024-05-30 DIAGNOSIS — Z13220 Encounter for screening for lipoid disorders: Secondary | ICD-10-CM

## 2024-05-30 LAB — COMPREHENSIVE METABOLIC PANEL WITH GFR
ALT: 27 U/L (ref 0–35)
AST: 23 U/L (ref 0–37)
Albumin: 4 g/dL (ref 3.5–5.2)
Alkaline Phosphatase: 95 U/L (ref 39–117)
BUN: 12 mg/dL (ref 6–23)
CO2: 30 meq/L (ref 19–32)
Calcium: 9.5 mg/dL (ref 8.4–10.5)
Chloride: 101 meq/L (ref 96–112)
Creatinine, Ser: 0.74 mg/dL (ref 0.40–1.20)
GFR: 93.49 mL/min (ref >60.00)
Glucose, Bld: 85 mg/dL (ref 70–99)
Potassium: 4.5 meq/L (ref 3.5–5.1)
Sodium: 135 meq/L (ref 135–145)
Total Bilirubin: 0.3 mg/dL (ref 0.2–1.2)
Total Protein: 6.9 g/dL (ref 6.0–8.3)

## 2024-05-30 LAB — CBC
HCT: 39 % (ref 36.0–46.0)
Hemoglobin: 13.1 g/dL (ref 12.0–15.0)
MCHC: 33.7 g/dL (ref 30.0–36.0)
MCV: 89.1 fl (ref 78.0–100.0)
Platelets: 211 K/uL (ref 150.0–400.0)
RBC: 4.38 Mil/uL (ref 3.87–5.11)
RDW: 13.4 % (ref 11.5–15.5)
WBC: 6.8 K/uL (ref 4.0–10.5)

## 2024-05-30 LAB — LIPID PANEL
Cholesterol: 177 mg/dL (ref 0–200)
HDL: 70 mg/dL (ref >39.00)
LDL Cholesterol: 78 mg/dL (ref 0–99)
NonHDL: 107.36
Total CHOL/HDL Ratio: 3
Triglycerides: 149 mg/dL (ref 0.0–149.0)
VLDL: 29.8 mg/dL (ref 0.0–40.0)

## 2024-05-30 LAB — VITAMIN B12: Vitamin B-12: 360 pg/mL (ref 211–911)

## 2024-05-30 LAB — HEMOGLOBIN A1C: Hgb A1c MFr Bld: 5.5 % (ref 4.6–6.5)

## 2024-05-30 LAB — FERRITIN: Ferritin: 10.7 ng/mL (ref 10.0–291.0)

## 2024-05-30 LAB — TSH: TSH: 1.28 u[IU]/mL (ref 0.35–5.50)

## 2024-05-30 LAB — VITAMIN D 25 HYDROXY (VIT D DEFICIENCY, FRACTURES): VITD: 24.58 ng/mL — ABNORMAL LOW (ref 30.00–100.00)

## 2024-05-30 LAB — FOLATE: Folate: >23.7 ng/mL (ref >5.9)

## 2024-05-30 MED ORDER — MAGIC MOUTHWASH W/LIDOCAINE: 5.0000 mL | Freq: Four times a day (QID) | ORAL | 0 refills | Status: AC | PRN

## 2024-05-30 MED ORDER — NYSTATIN 100000 UNIT/ML MT SUSP
OROMUCOSAL | 0 refills | Status: AC
  Filled 2024-05-30: qty 120, 6d supply, fill #0

## 2024-05-30 NOTE — Patient Instructions (Addendum)
 Use the magic mouthwash as directed and avoid foods or liquids which irritate your tongue.  Babying your tongue for a week or so may get it healed.  I will also check for any deficiency that might cause this.  If we turn up empty it could be related to your UC and we will touch base with GI

## 2024-05-30 NOTE — Addendum Note (Signed)
 Addended by: ORVIN HARLENE HERO on: 05/30/2024 11:38 AM   Modules accepted: Orders

## 2024-05-31 ENCOUNTER — Ambulatory Visit

## 2024-06-02 ENCOUNTER — Ambulatory Visit: Payer: Self-pay | Admitting: Family Medicine

## 2024-06-03 LAB — ZINC: Zinc: 57 ug/dL — ABNORMAL LOW (ref 60–130)

## 2024-06-03 LAB — VITAMIN B6: Vitamin B6: 34.1 ng/mL — ABNORMAL HIGH (ref 2.1–21.7)

## 2024-06-07 ENCOUNTER — Telehealth: Payer: Self-pay | Admitting: Pharmacy Technician

## 2024-06-07 ENCOUNTER — Ambulatory Visit

## 2024-06-07 NOTE — Telephone Encounter (Addendum)
 Auth Submission: approved Site of care: Site of care: [facility] Payer: [employer]  Medication & CPT/J Code(s) submitted: Entyvio  (Vedolizumab ) J3380 Diagnosis Code: K50.00 Route of submission (phone, fax, portal): CMM Phone # Fax # Auth type: pharmacy benefit. Must use [facility]. [phone] Units/visits requested: 300MG  Q8WKS Reference number:  (KeyBETHA PADDOCK) - [id number] Case id: [id number] Approval from: 06/07/24 to  06/19/25  Co-pay;  [facility] aware

## 2024-06-19 ENCOUNTER — Ambulatory Visit

## 2024-06-20 ENCOUNTER — Encounter: Payer: Self-pay | Admitting: Internal Medicine

## 2024-06-20 ENCOUNTER — Telehealth: Payer: Self-pay

## 2024-06-20 NOTE — Telephone Encounter (Signed)
 Auth Submission: APPROVED Site of care: Site of care: [facility] Payer: [employer] commercial Medication & CPT/J Code(s) submitted: Entyvio  (Vedolizumab ) J3380 Diagnosis Code:  Route of submission (phone, fax, portal): CMM Phone # Fax # Auth type: Buy/Bill PB Units/visits requested: 300mg  x 6 doses Reference number: [id number] Approval from: 06/20/24 to 06/19/25   Entyvio  must come from [facility] specialty pharmacy. [facility] is sending the prescription and I will coordinate delivery. I will try to get it delivered before her 06/25/24 appointment date.

## 2024-06-22 ENCOUNTER — Telehealth: Payer: Self-pay

## 2024-06-22 NOTE — Telephone Encounter (Signed)
 Kim we received a fax from [facility] stating this pt needs to get specialty meds through [facility]. She is getting entyvio  infusions so not sure if this applies to her.  [facility] pharmacy [address] [place] [address] [phone] P [phone] F

## 2024-06-25 ENCOUNTER — Ambulatory Visit

## 2024-06-25 VITALS — BP 96/61 | HR 72 | Temp 97.7°F | Resp 16 | Ht 64.0 in | Wt 133.0 lb

## 2024-06-25 DIAGNOSIS — K5100 Ulcerative (chronic) pancolitis without complications: Secondary | ICD-10-CM

## 2024-06-25 MED ORDER — VEDOLIZUMAB 300 MG IV SOLR
300.0000 mg | Freq: Once | INTRAVENOUS | Status: AC
  Administered 2024-06-25: 300 mg via INTRAVENOUS
  Filled 2024-06-25: qty 5

## 2024-06-25 NOTE — Progress Notes (Signed)
 Diagnosis: Ulcerative pancolitis without complication   Provider:  Praveen Mannam MD  Procedure: IV Infusion  IV Type: Peripheral, IV Location: R Antecubital  Entyvio , Dose: 300 mg  Infusion Start Time: 1341  Infusion Stop Time: 1413  Post Infusion IV Care: Peripheral IV Discontinued  Discharge: Condition: Good, Destination: Home . AVS Declined  Performed by:  Maximiano JONELLE Pouch, LPN

## 2024-06-25 NOTE — Telephone Encounter (Signed)
 Notes thanks

## 2024-07-03 ENCOUNTER — Telehealth: Payer: Self-pay

## 2024-07-03 NOTE — Telephone Encounter (Signed)
 Faxed medication delivery request form to [facility] specialty pharmacy.

## 2024-07-03 NOTE — Telephone Encounter (Signed)
-----   Message from [facility] Clarita H sent at 07/02/2024 10:05 PM EST ----- MyChart message to patient to call the office to schedule an appointment with Dr. Albertus / APP. ----- Message ----- From: Albertus Gordy HERO, MD Sent: 07/02/2024  12:21 PM EST To: Alan LITTIE Fusi, CMA  I am completing this form and will place in my outbox This pt needs routine follow-up in the office for continuity JMP ----- Message ----- From: Elvin Bruno SAUNDERS, CPhT Sent: 07/02/2024   8:58 AM EST To: Gordy HERO Albertus, MD  Good morning, I am placing a Medication delivery request form in this patients media tab. The pharmacy needs the provider to fill this out and send it back to them in order for her next delivery of Entyvio  to be sent to the infusion center. Thank you, Krista

## 2024-08-03 ENCOUNTER — Encounter: Payer: Self-pay | Admitting: Family Medicine

## 2024-08-03 ENCOUNTER — Other Ambulatory Visit: Payer: Self-pay

## 2024-08-03 DIAGNOSIS — D689 Coagulation defect, unspecified: Secondary | ICD-10-CM

## 2024-08-03 MED ORDER — RIVAROXABAN 20 MG PO TABS: 20.0000 mg | ORAL_TABLET | Freq: Every day | ORAL | 3 refills | Status: AC

## 2024-08-13 ENCOUNTER — Other Ambulatory Visit (HOSPITAL_BASED_OUTPATIENT_CLINIC_OR_DEPARTMENT_OTHER): Payer: Self-pay | Admitting: Family Medicine

## 2024-08-13 DIAGNOSIS — Z1231 Encounter for screening mammogram for malignant neoplasm of breast: Secondary | ICD-10-CM

## 2024-08-18 ENCOUNTER — Ambulatory Visit (HOSPITAL_BASED_OUTPATIENT_CLINIC_OR_DEPARTMENT_OTHER)
Admission: RE | Admit: 2024-08-18 | Discharge: 2024-08-18 | Disposition: A | Discharge status: Home / Self Care | Source: Ambulatory Visit | Attending: Family Medicine | Admitting: Family Medicine

## 2024-08-18 DIAGNOSIS — Z1231 Encounter for screening mammogram for malignant neoplasm of breast: Secondary | ICD-10-CM | POA: Diagnosis present

## 2024-08-20 ENCOUNTER — Ambulatory Visit

## 2024-08-20 VITALS — BP 95/55 | HR 67 | Temp 98.0°F | Resp 14 | Ht 64.0 in | Wt 132.4 lb

## 2024-08-20 DIAGNOSIS — K5100 Ulcerative (chronic) pancolitis without complications: Secondary | ICD-10-CM

## 2024-08-20 MED ORDER — VEDOLIZUMAB 300 MG IV SOLR
300.0000 mg | Freq: Once | INTRAVENOUS | Status: AC
  Administered 2024-08-20: 300 mg via INTRAVENOUS
  Filled 2024-08-20: qty 5

## 2024-08-20 NOTE — Progress Notes (Signed)
 Diagnosis:, Ulcerative Colitis  Provider:  Lonna Coder MD  Procedure: IV Infusion  IV Type: Peripheral, IV Location: R Antecubital   Entyvio  (Vedolizumab ), Dose: 300 mg  Infusion Start Time: 1333  Infusion Stop Time: 1406  Post Infusion IV Care: Peripheral IV Discontinued  Discharge: Condition: Good, Destination: Home . AVS Declined  Performed by:  Donny Childes, RN

## 2024-08-28 NOTE — Progress Notes (Unsigned)
 "  Chief Complaint: Primary GI MD:  HPI:  *** is a  ***  who was referred to me by Copland, Harlene BROCKS, MD for a complaint of *** .      Discussed the use of AI scribe software for clinical note transcription with the patient, who gave verbal consent to proceed.  History of Present Illness      PREVIOUS GI WORKUP   Colonoscopy 12/2021 - The examined portion of the ileum was normal.  - Moderately active ( Mayo Score 2) pancolitis ulcerative colitis, unchanged since the last examination ( inflammation more significant in transverse and right colon, but present throughout) . Biopsied.  Diagnosis 1. Surgical [P], right colon bxs - MILDLY ACTIVE CHRONIC COLITIS, CONSISTENT WITH PATIENT'S CLINICAL HISTORY OF ULCERATIVE COLITIS - NEGATIVE FOR GRANULOMAS OR DYSPLASIA 2. Surgical [P], colon, transverse - MILDLY ACTIVE CHRONIC COLITIS, CONSISTENT WITH PATIENT'S CLINICAL HISTORY OF ULCERATIVE COLITIS - NEGATIVE FOR GRANULOMAS OR DYSPLASIA 3. Surgical [P], left colon bxs - MILDLY ACTIVE CHRONIC COLITIS, CONSISTENT WITH PATIENT'S CLINICAL HISTORY OF ULCERATIVE COLITIS - NEGATIVE FOR GRANULOMAS OR DYSPLASIA  Past Medical History:  Diagnosis Date   Abnormal LFTs    Clotting disorder    Esophagitis    High risk medication use    History of blood clots     B leg DVT 2005, abdomen. On Coumadin  for life   History of blood transfusion    after reaction to 6-MP   Long term (current) use of anticoagulants    Pulmonary embolism (HCC)    RA (rheumatoid arthritis) (HCC)    ~2005   Ulcerative colitis    dx  around 2004    Past Surgical History:  Procedure Laterality Date   vascular stents bilateral groins  2006    Current Outpatient Medications  Medication Sig Dispense Refill   Cholecalciferol (VITAMIN D3) 1.25 MG (50000 UT) CAPS Take 1 weekly for 12 weeks 12 capsule 0   levonorgestrel  (MIRENA ) 20 MCG/24HR IUD 1 each by Intrauterine route once.     magic mouthwash (nystatin , lidocaine ,  diphenhydrAMINE) suspension Swish and spit 5 mLs by mouth 4 times daily as needed for mouth pain. 120 mL 0   magic mouthwash w/lidocaine  SOLN Take 5 mLs by mouth 4 (four) times daily as needed for mouth pain. Swish and spit 120 mL 0   rivaroxaban  (XARELTO ) 20 MG TABS tablet Take 1 tablet (20 mg total) by mouth daily with supper. 90 tablet 3   traZODone  (DESYREL ) 50 MG tablet Take 1 tablet (50 mg total) by mouth at bedtime as needed for sleep. 90 tablet 3   Vedolizumab  (ENTYVIO  IV) Inject into the vein every 8 (eight) weeks.     No current facility-administered medications for this visit.    Allergies as of 08/30/2024 - Review Complete 05/30/2024  Allergen Reaction Noted   Mercaptopurine  Nausea And Vomiting 07/26/2011   Sulfa antibiotics Hives 11/03/2010    Family History  Problem Relation Age of Onset   Colon cancer Paternal Grandmother    Colon polyps Paternal Grandmother    Breast cancer Mother    Deep vein thrombosis Father        between lung and heart   CAD Neg Hx    Clotting disorder Neg Hx    Stomach cancer Neg Hx    Esophageal cancer Neg Hx     Social History   Socioeconomic History   Marital status: Married    Spouse name: Not on file   Number of  children: 1   Years of education: Not on file   Highest education level: Not on file  Occupational History   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, broadcasting/film/video , kindergarden       Social Drivers  of Health   Tobacco Use: Low Risk (05/30/2024)   Patient History    Smoking Tobacco Use: Never    Smokeless Tobacco Use: Never    Passive Exposure: Not on file  Financial Resource Strain: Not on file  Food Insecurity: Not on file  Transportation Needs: Not on file  Physical Activity: Not on file  Stress: Not on file  Social Connections: Not on file  Intimate Partner Violence: Not on file  Depression (PHQ2-9): Low Risk (05/30/2024)   Depression (PHQ2-9)    PHQ-2 Score: 3  Alcohol Screen: Not on file  Housing: Not on file  Utilities: Not on file  Health Literacy: Not on file    Review of Systems:    Constitutional: No weight loss, fever, chills, weakness or fatigue HEENT: Eyes: No change in vision               Ears, Nose, Throat:  No change in hearing or congestion Skin: No rash or itching Cardiovascular: No chest pain, chest pressure or palpitations   Respiratory: No SOB or cough Gastrointestinal: See HPI and otherwise negative Genitourinary: No dysuria or change in urinary frequency Neurological: No headache, dizziness or syncope Musculoskeletal: No new muscle or joint pain Hematologic: No bleeding or bruising Psychiatric: No history of depression or anxiety    Physical Exam:  Vital signs: There were no vitals taken for this visit.  Constitutional: NAD, alert and cooperative Head:  Normocephalic and atraumatic. Eyes:   PEERL, EOMI. No icterus. Conjunctiva pink. Respiratory: Respirations even and unlabored. Lungs clear to auscultation bilaterally.   No wheezes, crackles, or rhonchi.  Cardiovascular:  Regular rate and rhythm. No peripheral edema, cyanosis or pallor.  Gastrointestinal:  Soft, nondistended, nontender. No rebound or guarding. Normal bowel sounds. No appreciable masses or hepatomegaly. Rectal:  Declines Msk:  Symmetrical without gross deformities. Without edema, no deformity or joint abnormality.  Neurologic:  Alert and  oriented x4;  grossly normal  neurologically.  Skin:   Dry and intact without significant lesions or rashes. Psychiatric: Oriented to person, place and time. Demonstrates good judgement and reason without abnormal affect or behaviors.  Physical Exam    RELEVANT LABS AND IMAGING: CBC    Component Value Date/Time   WBC 6.8 05/30/2024 1050   RBC 4.38 05/30/2024 1050   HGB 13.1 05/30/2024 1050   HGB 13.6 07/16/2015 1523   HGB 14.1 08/02/2011 1207   HCT 39.0 05/30/2024 1050   HCT 39.9 07/16/2015 1523   HCT 41.3 08/02/2011 1207   PLT 211.0 05/30/2024 1050   PLT 171 07/16/2015  1523   PLT 172 08/02/2011 1207   MCV 89.1 05/30/2024 1050   MCV 88 07/16/2015 1523   MCV 90.9 08/02/2011 1207   MCH 30.0 07/16/2015 1523   MCHC 33.7 05/30/2024 1050   RDW 13.4 05/30/2024 1050   RDW 12.7 07/16/2015 1523   RDW 12.7 08/02/2011 1207   LYMPHSABS 0.6 (L) 02/05/2022 1024   LYMPHSABS 2.5 07/16/2015 1523   LYMPHSABS 1.7 08/02/2011 1207   MONOABS 0.2 02/05/2022 1024   MONOABS 0.6 08/02/2011 1207   EOSABS 0.0 02/05/2022 1024   EOSABS 0.1 07/16/2015 1523   BASOSABS 0.0 02/05/2022 1024   BASOSABS 0.0 07/16/2015 1523   BASOSABS 0.0 08/02/2011 1207    CMP     Component Value Date/Time   NA 135 05/30/2024 1050   K 4.5 05/30/2024 1050   CL 101 05/30/2024 1050   CO2 30 05/30/2024 1050   GLUCOSE 85 05/30/2024 1050   BUN 12 05/30/2024 1050   CREATININE 0.74 05/30/2024 1050   CALCIUM 9.5 05/30/2024 1050   PROT 6.9 05/30/2024 1050   ALBUMIN 4.0 05/30/2024 1050   AST 23 05/30/2024 1050   ALT 27 05/30/2024 1050   ALKPHOS 95 05/30/2024 1050   BILITOT 0.3 05/30/2024 1050     Assessment/Plan:    54 year old female history of ulcerative pancolitis (diagnosed in 2003), DVT and PE on Xarelto , rheumatoid arthritis, presents for follow-up  Pan ulcerative colitis Failed Lialda  (hair thinning), currently on Entyvio .  Colonoscopy 12/2021 with moderately active pancolitis ulcerative colitis Mayo score 2.  Fecal calprotectin 01/2022  1,210.  Last flare treated with prednisone  in 2023.  History of DVT/PE On Xarelto     Jaelyn Bourgoin Mollie RIGGERS [facility] 08/28/2024, 3:01 PM  Cc: Copland, Harlene BROCKS, MD "

## 2024-08-30 ENCOUNTER — Ambulatory Visit: Admitting: Gastroenterology

## 2024-08-30 ENCOUNTER — Telehealth: Payer: Self-pay

## 2024-08-30 ENCOUNTER — Encounter: Payer: Self-pay | Admitting: Gastroenterology

## 2024-08-30 ENCOUNTER — Other Ambulatory Visit

## 2024-08-30 VITALS — BP 118/66 | HR 73 | Ht 64.0 in | Wt 132.2 lb

## 2024-08-30 DIAGNOSIS — K5100 Ulcerative (chronic) pancolitis without complications: Secondary | ICD-10-CM

## 2024-08-30 DIAGNOSIS — Z7901 Long term (current) use of anticoagulants: Secondary | ICD-10-CM | POA: Diagnosis not present

## 2024-08-30 DIAGNOSIS — Z86718 Personal history of other venous thrombosis and embolism: Secondary | ICD-10-CM

## 2024-08-30 DIAGNOSIS — K649 Unspecified hemorrhoids: Secondary | ICD-10-CM | POA: Diagnosis not present

## 2024-08-30 LAB — CBC WITH DIFFERENTIAL/PLATELET
Basophils Absolute: 0 10*3/uL (ref 0.0–0.1)
Basophils Relative: 0.4 % (ref 0.0–3.0)
Eosinophils Absolute: 0.6 10*3/uL (ref 0.0–0.7)
Eosinophils Relative: 8.2 % — ABNORMAL HIGH (ref 0.0–5.0)
HCT: 39.8 % (ref 36.0–46.0)
Hemoglobin: 13.4 g/dL (ref 12.0–15.0)
Lymphocytes Relative: 30.1 % (ref 12.0–46.0)
Lymphs Abs: 2.3 10*3/uL (ref 0.7–4.0)
MCHC: 33.7 g/dL (ref 30.0–36.0)
MCV: 89.3 fl (ref 78.0–100.0)
Monocytes Absolute: 0.6 10*3/uL (ref 0.1–1.0)
Monocytes Relative: 8.3 % (ref 3.0–12.0)
Neutro Abs: 4 10*3/uL (ref 1.4–7.7)
Neutrophils Relative %: 53 % (ref 43.0–77.0)
Platelets: 241 10*3/uL (ref 150.0–400.0)
RBC: 4.46 Mil/uL (ref 3.87–5.11)
RDW: 13 % (ref 11.5–15.5)
WBC: 7.6 10*3/uL (ref 4.0–10.5)

## 2024-08-30 LAB — COMPREHENSIVE METABOLIC PANEL WITH GFR
ALT: 34 U/L (ref 3–35)
AST: 26 U/L (ref 5–37)
Albumin: 4.2 g/dL (ref 3.5–5.2)
Alkaline Phosphatase: 123 U/L — ABNORMAL HIGH (ref 39–117)
BUN: 11 mg/dL (ref 6–23)
CO2: 33 meq/L — ABNORMAL HIGH (ref 19–32)
Calcium: 9.2 mg/dL (ref 8.4–10.5)
Chloride: 101 meq/L (ref 96–112)
Creatinine, Ser: 0.84 mg/dL (ref 0.40–1.20)
GFR: 80.16 mL/min
Glucose, Bld: 91 mg/dL (ref 70–99)
Potassium: 3.7 meq/L (ref 3.5–5.1)
Sodium: 137 meq/L (ref 135–145)
Total Bilirubin: 0.3 mg/dL (ref 0.2–1.2)
Total Protein: 7.6 g/dL (ref 6.0–8.3)

## 2024-08-30 LAB — C-REACTIVE PROTEIN: CRP: <0.5 mg/dL — ABNORMAL LOW (ref 1.0–20.0)

## 2024-08-30 LAB — SEDIMENTATION RATE: Sed Rate: 10 mm/h (ref 0–30)

## 2024-08-30 MED ORDER — HYDROCORTISONE (PERIANAL) 2.5 % EX CREA: 1.0000 | TOPICAL_CREAM | Freq: Two times a day (BID) | CUTANEOUS | 0 refills | Status: AC

## 2024-08-30 MED ORDER — SUTAB 1479-225-188 MG PO TABS: ORAL_TABLET | ORAL | 0 refills | Status: AC

## 2024-08-30 NOTE — Patient Instructions (Addendum)
 _______________________________________________________  If your blood pressure at your visit was 140/90 or greater, please contact your primary care physician to follow up on this.  _______________________________________________________  If you are age 52 or older, your body mass index should be between 23-30. Your Body mass index is 22.7 kg/m. If this is out of the aforementioned range listed, please consider follow up with your Primary Care Provider.  If you are age 20 or younger, your body mass index should be between 19-25. Your Body mass index is 22.7 kg/m. If this is out of the aformentioned range listed, please consider follow up with your Primary Care Provider.   ________________________________________________________  The [facility] providers would like to encourage you to use MYCHART to communicate with providers for non-urgent requests or questions.  Due to long hold times on the telephone, sending your provider a message by [facility] may be a faster and more efficient way to get a response.  Please allow 48 business hours for a response.  Please remember that this is for non-urgent requests.  _______________________________________________________  [facility] is using a team-based approach to care.  Your team is made up of your doctor and two to three APPS. Our APPS (Nurse Practitioners and Physician Assistants) work with your physician to ensure care continuity for you. They are fully qualified to address your health concerns and develop a treatment plan. They communicate directly with your gastroenterologist to care for you. Seeing the Advanced Practice Practitioners on your physician's team can help you by facilitating care more promptly, often allowing for earlier appointments, access to diagnostic testing, procedures, and other specialty referrals.   Your provider has requested that you go to the basement level for lab work before leaving today. Press B on the  elevator. The lab is located at the first door on the left as you exit the elevator.  We have sent the following medications to your pharmacy for you to pick up at your convenience: Hydrocortisone  cream two times daily for 1 week  You have been scheduled for a colonoscopy. Please follow written instructions given to you at your visit today.   If you use inhalers (even only as needed), please bring them with you on the day of your procedure.  DO NOT TAKE 7 DAYS PRIOR TO TEST- Trulicity (dulaglutide) Ozempic, Wegovy (semaglutide) Mounjaro, Zepbound (tirzepatide) Bydureon Bcise (exanatide extended release)  DO NOT TAKE 1 DAY PRIOR TO YOUR TEST Rybelsus (semaglutide) Adlyxin (lixisenatide) Victoza (liraglutide) Byetta (exanatide) ___________________________________________________________________________  Rosine will be contacted by our office prior to your procedure for directions on holding your XARELTO .  If you do not hear from our office 1 week prior to your scheduled procedure, please call [phone] to discuss.  Due to recent changes in healthcare laws, you may see the results of your imaging and laboratory studies on MyChart before your provider has had a chance to review them.  We understand that in some cases there may be results that are confusing or concerning to you. Not all laboratory results come back in the same time frame and the provider may be waiting for multiple results in order to interpret others.  Please give us  48 hours in order for your provider to thoroughly review all the results before contacting the office for clarification of your results.   Thank you for entrusting me with your care and choosing [facility].  [place]

## 2024-08-30 NOTE — Telephone Encounter (Signed)
 [facility] Pre-operative Risk Assessment     Request for surgical clearance:     Endoscopy Procedure  What type of surgery is being performed?     Colonoscopy  When is this surgery scheduled?     10-26-24  What type of clearance is required ?   Pharmacy  Are there any medications that need to be held prior to surgery and how long? Xarelto x 2 days  Practice name and name of physician performing surgery?      [facility]  What is your office phone and fax number?      Phone- [phone]  Fax- [phone]  Anesthesia type (None, local, MAC, general) ?       MAC   Please route your response to Nat Sic, CMA

## 2024-09-02 LAB — HEPATITIS B CORE ANTIBODY, TOTAL: Hep B Core Total Ab: NONREACTIVE

## 2024-09-02 LAB — QUANTIFERON-TB GOLD PLUS
Mitogen-NIL: 6.76 [IU]/mL
NIL: 0.01 [IU]/mL
QuantiFERON-TB Gold Plus: NEGATIVE
TB1-NIL: 0 [IU]/mL
TB2-NIL: 0 [IU]/mL

## 2024-09-02 LAB — HEPATITIS B SURFACE ANTIBODY,QUALITATIVE: Hep B S Ab: NONREACTIVE

## 2024-09-02 LAB — HEPATITIS B SURFACE ANTIGEN: Hepatitis B Surface Ag: NONREACTIVE

## 2024-09-03 NOTE — Telephone Encounter (Signed)
" ° °  Name: Annette Hopkins  DOB: [birth year]  MRN: [id number]   Primary Cardiologist: None  Chart reviewed as part of pre-operative protocol coverage.   Xarelto  prescribed by a noncardiology provider (PCP) for PE/DVT, therefore recommendations for holding deferred to prescribing provider.    I will route this recommendation to the requesting party via Epic fax function and remove from pre-op pool. Please call with questions.  Barnie Hila, NP 09/03/2024, 3:02 PM  "

## 2024-09-03 NOTE — Telephone Encounter (Signed)
 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 Patient had first DVT in 2005 and was managed with warfarin. Patient became pregnant shortly after and heparin was initiated. Patient had a second DVT on heparin after her daughter was born. Patient was changed to warfarin at that time. Patient was taken off anticoagulation in August 2015. Patient had a PE in February 2016. Patient has been maintained on Xarelto  since this event. Xarelto  is prescribed and managed by per PCP, Harlene Schroeder, MD. Will defer to managing provider for further input.   Morna Breach, PharmD, BCPS PGY2 Cardiology Pharmacy Resident

## 2024-09-04 ENCOUNTER — Ambulatory Visit: Payer: Self-pay | Admitting: Gastroenterology

## 2024-09-04 ENCOUNTER — Other Ambulatory Visit

## 2024-09-04 DIAGNOSIS — K649 Unspecified hemorrhoids: Secondary | ICD-10-CM

## 2024-09-04 DIAGNOSIS — K5100 Ulcerative (chronic) pancolitis without complications: Secondary | ICD-10-CM

## 2024-09-04 NOTE — Progress Notes (Signed)
 Addendum: Reviewed and agree with assessment and management plan. Okay for 1 day hold of DOAC (Xarelto ) before her endoscopic procedure.  Renal function is normal and 24 hours is acceptable Aiyannah Fayad, Gordy HERO, MD

## 2024-09-04 NOTE — Telephone Encounter (Signed)
 Okay for 1 day hold of DOAC (Xarelto ) before her endoscopic procedure.  Renal function is normal and 24 hours is acceptable Pyrtle, Gordy HERO, MD  Dr Albertus said it would be acceptable to only hold Xarelto  24 hours vs 48 hours.  Please advise.    Thank you

## 2024-09-05 ENCOUNTER — Encounter: Payer: Self-pay | Admitting: Family Medicine

## 2024-09-06 LAB — CALPROTECTIN, FECAL: Calprotectin, Fecal: 579 ug/g — ABNORMAL HIGH (ref 0–120)

## 2024-09-06 NOTE — Telephone Encounter (Signed)
 Left message for patient to call back. She has been given clearance by PCP to hold Xarelto  24 hours prior to her upcoming endoscopic procedure.

## 2024-09-07 ENCOUNTER — Encounter (HOSPITAL_BASED_OUTPATIENT_CLINIC_OR_DEPARTMENT_OTHER): Admitting: Radiology

## 2024-09-07 DIAGNOSIS — Z1231 Encounter for screening mammogram for malignant neoplasm of breast: Secondary | ICD-10-CM

## 2024-09-07 NOTE — Telephone Encounter (Signed)
 Spoke with patient to advise that she may hold Xarelto  24 hours prior to her upcoming endoscopic procedure as okayed by PCP, Dr Watt. Patient verbalizes understanding.

## 2024-09-07 NOTE — Telephone Encounter (Signed)
 Patient has scheduled appointment for Hep B #1 on 09/21/24 at 3 pm.

## 2024-09-21 ENCOUNTER — Ambulatory Visit

## 2024-10-15 ENCOUNTER — Ambulatory Visit

## 2024-10-22 ENCOUNTER — Ambulatory Visit

## 2024-10-26 ENCOUNTER — Ambulatory Visit

## 2024-10-26 ENCOUNTER — Encounter: Admitting: Internal Medicine
